# Patient Record
Sex: Male | Born: 1980 | Race: Black or African American | Hispanic: No | Smoking: Former smoker
Health system: Southern US, Community
[De-identification: ages and names within clinical notes are randomized; demographics above are authoritative.]

## PROBLEM LIST (undated history)

## (undated) DIAGNOSIS — F319 Bipolar disorder, unspecified: Secondary | ICD-10-CM

## (undated) DIAGNOSIS — F29 Unspecified psychosis not due to a substance or known physiological condition: Secondary | ICD-10-CM

## (undated) DIAGNOSIS — I10 Essential (primary) hypertension: Secondary | ICD-10-CM

## (undated) DIAGNOSIS — F419 Anxiety disorder, unspecified: Secondary | ICD-10-CM

## (undated) DIAGNOSIS — F329 Major depressive disorder, single episode, unspecified: Secondary | ICD-10-CM

## (undated) DIAGNOSIS — F209 Schizophrenia, unspecified: Secondary | ICD-10-CM

## (undated) HISTORY — DX: Essential (primary) hypertension: I10

## (undated) HISTORY — DX: Unspecified psychosis not due to a substance or known physiological condition: F29

## (undated) HISTORY — DX: Anxiety disorder, unspecified: F41.9

## (undated) HISTORY — PX: WISDOM TOOTH EXTRACTION: SHX21

## (undated) HISTORY — DX: Major depressive disorder, single episode, unspecified: F32.9

---
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
## 2002-04-13 ENCOUNTER — Encounter: Payer: Self-pay | Admitting: *Deleted

## 2002-04-13 ENCOUNTER — Emergency Department (HOSPITAL_COMMUNITY): Admission: EM | Admit: 2002-04-13 | Discharge: 2002-04-13 | Payer: Self-pay | Admitting: *Deleted

## 2003-08-15 ENCOUNTER — Emergency Department (HOSPITAL_COMMUNITY): Admission: EM | Admit: 2003-08-15 | Discharge: 2003-08-15 | Payer: Self-pay | Admitting: Emergency Medicine

## 2005-06-19 ENCOUNTER — Emergency Department (HOSPITAL_COMMUNITY): Admission: EM | Admit: 2005-06-19 | Discharge: 2005-06-19 | Payer: Self-pay | Admitting: Emergency Medicine

## 2007-09-02 ENCOUNTER — Emergency Department (HOSPITAL_COMMUNITY): Admission: EM | Admit: 2007-09-02 | Discharge: 2007-09-02 | Payer: Self-pay | Admitting: Emergency Medicine

## 2007-09-24 ENCOUNTER — Emergency Department (HOSPITAL_COMMUNITY): Admission: EM | Admit: 2007-09-24 | Discharge: 2007-09-24 | Payer: Self-pay | Admitting: Emergency Medicine

## 2007-11-02 ENCOUNTER — Emergency Department (HOSPITAL_COMMUNITY): Admission: EM | Admit: 2007-11-02 | Discharge: 2007-11-02 | Payer: Self-pay | Admitting: Emergency Medicine

## 2008-09-29 ENCOUNTER — Emergency Department (HOSPITAL_COMMUNITY): Admission: EM | Admit: 2008-09-29 | Discharge: 2008-09-29 | Payer: Self-pay | Admitting: Emergency Medicine

## 2009-04-14 ENCOUNTER — Emergency Department (HOSPITAL_COMMUNITY): Admission: EM | Admit: 2009-04-14 | Discharge: 2009-04-14 | Payer: Self-pay | Admitting: Emergency Medicine

## 2010-03-30 ENCOUNTER — Emergency Department (HOSPITAL_COMMUNITY)
Admission: EM | Admit: 2010-03-30 | Discharge: 2010-03-30 | Disposition: A | Discharge status: Home / Self Care | Payer: Self-pay | Attending: Emergency Medicine | Admitting: Emergency Medicine

## 2010-03-30 DIAGNOSIS — R51 Headache: Secondary | ICD-10-CM | POA: Insufficient documentation

## 2010-05-03 LAB — BASIC METABOLIC PANEL
BUN: 23 mg/dL (ref 6–23)
CO2: 28 mEq/L (ref 19–32)
Calcium: 8.7 mg/dL (ref 8.4–10.5)
Chloride: 103 mEq/L (ref 96–112)
Creatinine, Ser: 1.15 mg/dL (ref 0.4–1.5)
GFR calc Af Amer: >60 mL/min (ref >60)
GFR calc non Af Amer: >60 mL/min (ref >60)
Glucose, Bld: 102 mg/dL — ABNORMAL HIGH (ref 70–99)
Potassium: 3.8 mEq/L (ref 3.5–5.1)
Sodium: 137 mEq/L (ref 135–145)

## 2010-05-03 LAB — CBC
HCT: 45.1 % (ref 39.0–52.0)
Hemoglobin: 14.8 g/dL (ref 13.0–17.0)
MCHC: 32.7 g/dL (ref 30.0–36.0)
MCV: 83.5 fL (ref 78.0–100.0)
Platelets: 182 10*3/uL (ref 150–400)
RBC: 5.4 MIL/uL (ref 4.22–5.81)
RDW: 14 % (ref 11.5–15.5)
WBC: 8 10*3/uL (ref 4.0–10.5)

## 2010-05-03 LAB — DIFFERENTIAL
Basophils Absolute: 0 10*3/uL (ref 0.0–0.1)
Basophils Relative: 0 % (ref 0–1)
Eosinophils Absolute: 0 10*3/uL (ref 0.0–0.7)
Eosinophils Relative: 0 % (ref 0–5)
Lymphocytes Relative: 2 % — ABNORMAL LOW (ref 12–46)
Lymphs Abs: 0.2 10*3/uL — ABNORMAL LOW (ref 0.7–4.0)
Monocytes Absolute: 0.3 10*3/uL (ref 0.1–1.0)
Monocytes Relative: 3 % (ref 3–12)
Neutro Abs: 7.6 10*3/uL (ref 1.7–7.7)
Neutrophils Relative %: 94 % — ABNORMAL HIGH (ref 43–77)

## 2010-06-09 DIAGNOSIS — F250 Schizoaffective disorder, bipolar type: Secondary | ICD-10-CM | POA: Insufficient documentation

## 2010-06-09 DIAGNOSIS — F319 Bipolar disorder, unspecified: Secondary | ICD-10-CM | POA: Insufficient documentation

## 2010-06-09 DIAGNOSIS — F32A Depression, unspecified: Secondary | ICD-10-CM

## 2010-06-09 HISTORY — DX: Depression, unspecified: F32.A

## 2010-07-09 ENCOUNTER — Emergency Department (HOSPITAL_COMMUNITY)
Admission: EM | Admit: 2010-07-09 | Discharge: 2010-07-09 | Disposition: A | Discharge status: Home / Self Care | Payer: Self-pay | Attending: Emergency Medicine | Admitting: Emergency Medicine

## 2010-07-09 DIAGNOSIS — Z711 Person with feared health complaint in whom no diagnosis is made: Secondary | ICD-10-CM | POA: Insufficient documentation

## 2010-07-10 ENCOUNTER — Emergency Department (HOSPITAL_COMMUNITY)
Admission: EM | Admit: 2010-07-10 | Discharge: 2010-07-11 | Disposition: A | Discharge status: Home / Self Care | Payer: Self-pay | Attending: Emergency Medicine | Admitting: Emergency Medicine

## 2010-07-10 ENCOUNTER — Emergency Department (HOSPITAL_COMMUNITY): Admission: EM | Admit: 2010-07-10 | Payer: Self-pay | Source: Home / Self Care

## 2010-07-10 DIAGNOSIS — Z008 Encounter for other general examination: Secondary | ICD-10-CM | POA: Insufficient documentation

## 2010-07-10 DIAGNOSIS — F17200 Nicotine dependence, unspecified, uncomplicated: Secondary | ICD-10-CM | POA: Insufficient documentation

## 2010-07-10 LAB — BASIC METABOLIC PANEL
CO2: 26 mEq/L (ref 19–32)
Calcium: 10.2 mg/dL (ref 8.4–10.5)
Creatinine, Ser: 1.24 mg/dL (ref 0.4–1.5)
GFR calc Af Amer: >60 mL/min (ref >60)

## 2010-07-10 LAB — RAPID URINE DRUG SCREEN, HOSP PERFORMED
Opiates: NOT DETECTED
Tetrahydrocannabinol: NOT DETECTED

## 2010-07-10 LAB — CBC
HCT: 43 % (ref 39.0–52.0)
MCHC: 32.6 g/dL (ref 30.0–36.0)
RDW: 14.1 % (ref 11.5–15.5)

## 2010-07-10 LAB — DIFFERENTIAL
Basophils Absolute: 0 10*3/uL (ref 0.0–0.1)
Basophils Relative: 0 % (ref 0–1)
Eosinophils Absolute: 0.1 10*3/uL (ref 0.0–0.7)
Eosinophils Relative: 1 % (ref 0–5)
Monocytes Absolute: 0.7 10*3/uL (ref 0.1–1.0)

## 2010-07-10 LAB — ETHANOL: Alcohol, Ethyl (B): <11 mg/dL — ABNORMAL HIGH (ref 0–10)

## 2010-07-13 ENCOUNTER — Emergency Department (HOSPITAL_COMMUNITY)
Admission: EM | Admit: 2010-07-13 | Discharge: 2010-07-17 | Disposition: A | Discharge status: Other Acute Inpatient Hospital | Payer: Self-pay | Attending: Emergency Medicine | Admitting: Emergency Medicine

## 2010-07-13 DIAGNOSIS — F29 Unspecified psychosis not due to a substance or known physiological condition: Secondary | ICD-10-CM | POA: Insufficient documentation

## 2010-07-13 DIAGNOSIS — R45851 Suicidal ideations: Secondary | ICD-10-CM | POA: Insufficient documentation

## 2010-07-13 LAB — DIFFERENTIAL
Basophils Absolute: 0 10*3/uL (ref 0.0–0.1)
Eosinophils Absolute: 0.2 10*3/uL (ref 0.0–0.7)
Eosinophils Relative: 3 % (ref 0–5)
Lymphocytes Relative: 19 % (ref 12–46)
Monocytes Absolute: 0.5 10*3/uL (ref 0.1–1.0)
Monocytes Relative: 8 % (ref 3–12)
Neutro Abs: 4.5 10*3/uL (ref 1.7–7.7)
Neutrophils Relative %: 69 % (ref 43–77)

## 2010-07-13 LAB — CBC
HCT: 43.5 % (ref 39.0–52.0)
MCV: 82.4 fL (ref 78.0–100.0)
Platelets: 206 10*3/uL (ref 150–400)
RBC: 5.28 MIL/uL (ref 4.22–5.81)
WBC: 6.6 10*3/uL (ref 4.0–10.5)

## 2010-07-13 LAB — BASIC METABOLIC PANEL
CO2: 33 mEq/L — ABNORMAL HIGH (ref 19–32)
Chloride: 101 mEq/L (ref 96–112)
GFR calc Af Amer: >60 mL/min (ref >60)
GFR calc non Af Amer: >60 mL/min (ref >60)
Glucose, Bld: 102 mg/dL — ABNORMAL HIGH (ref 70–99)
Potassium: 4.8 mEq/L (ref 3.5–5.1)
Sodium: 138 mEq/L (ref 135–145)

## 2010-07-13 LAB — RAPID URINE DRUG SCREEN, HOSP PERFORMED
Benzodiazepines: NOT DETECTED
Cocaine: NOT DETECTED
Opiates: NOT DETECTED

## 2010-07-17 ENCOUNTER — Inpatient Hospital Stay (HOSPITAL_COMMUNITY)
Admission: AD | Admit: 2010-07-17 | Discharge: 2010-07-20 | DRG: 885 | Disposition: A | Discharge status: Home / Self Care | Payer: 59 | Attending: Psychiatry | Admitting: Psychiatry

## 2010-07-17 DIAGNOSIS — G4700 Insomnia, unspecified: Secondary | ICD-10-CM

## 2010-07-17 DIAGNOSIS — Z560 Unemployment, unspecified: Secondary | ICD-10-CM

## 2010-07-17 DIAGNOSIS — F39 Unspecified mood [affective] disorder: Principal | ICD-10-CM

## 2010-07-17 DIAGNOSIS — Z91199 Patient's noncompliance with other medical treatment and regimen due to unspecified reason: Secondary | ICD-10-CM

## 2010-07-17 DIAGNOSIS — Z5900 Homelessness unspecified: Secondary | ICD-10-CM

## 2010-07-17 DIAGNOSIS — F29 Unspecified psychosis not due to a substance or known physiological condition: Secondary | ICD-10-CM

## 2010-07-17 DIAGNOSIS — Z9119 Patient's noncompliance with other medical treatment and regimen: Secondary | ICD-10-CM

## 2010-07-17 DIAGNOSIS — F319 Bipolar disorder, unspecified: Secondary | ICD-10-CM

## 2010-07-18 DIAGNOSIS — F29 Unspecified psychosis not due to a substance or known physiological condition: Secondary | ICD-10-CM

## 2010-07-28 NOTE — Discharge Summary (Signed)
  NAMEAMADO, ANDAL            ACCOUNT NO.:  [id number]  MEDICAL RECORD NO.:  [id number]  LOCATION:  0401                          FACILITY:  [facility]  PHYSICIAN:  Eulogio Ditch, MD DATE OF BIRTH:  02-07-1981  DATE OF ADMISSION:  07/17/2010 DATE OF DISCHARGE:  07/20/2010                              DISCHARGE SUMMARY   HISTORY OF PRESENT ILLNESS:  Please refer to initial psych assessment for details.  Briefly, 30 year old [place] American male who was admitted for delusional behavior.  The patient was started on Risperdal 1 mg twice a day along with Depakote 500 mg twice a day.  The patient responded to these medications well.  When I saw this patient on July 20, 2010 the patient was very logical and goal-directed, was not delusional, hallucinating, was not suicidal or homicidal.  The patient reported that if he gets a place to stay he will fine.  He came to this hospital to get help both for his medications and also to get a place to live.  The patient denied any side effects from the medications.  At the time of discharge the patient was very logical and goal-directed. His insight and judgment was intact.  I spoke with the clinical social worker and she found a place/shelter for the patient and the patient was agreeable for discharge to go to the shelter.  DIAGNOSES AT THE TIME OF DISCHARGE:  Axis I: Mood disorder NOS, rule out bipolar disorder with psychotic symptoms. Axis II: Deferred. Axis III: No active medical issue. Axis IV: Psychosocial stressors, homeless. Axis V: 55.  MEDICATIONS AT THE TIME OF DISCHARGE: 1. Depakote 500 mg twice a day. 2. Risperdal 1 mg twice a day.  Labs at the time of discharge within normal limits.  DISCHARGE FOLLOWUP:  The patient will follow up at [facility], [address] [place], phone number [phone].  Appointment between 9 and 11.  No specific date given to the patient, but he will follow up with there in 1-2  days.     Eulogio Ditch, MD     SA/MEDQ  D:  07/20/2010  T:  07/20/2010  Job:  [id number]  Electronically Signed by Eulogio Ditch  on 07/28/2010 09:55:32 AM

## 2010-07-28 NOTE — H&P (Signed)
NAMEMarland Kitchen  Brad Moreno, Brad Moreno NO.:  [id number]  MEDICAL RECORD NO.:  [id number]  LOCATION:  0401                          FACILITY:  [facility]  PHYSICIAN:  Eulogio Ditch, MD DATE OF BIRTH:  1980-09-03  DATE OF ADMISSION:  07/17/2010 DATE OF DISCHARGE:                      PSYCHIATRIC ADMISSION ASSESSMENT   This is an involuntary admission to the services of Dr. Rogers Blocker.  This is a 30 year old, divorced, African American male.  The commitment papers indicate that he was delusional.  He was reporting that people were trying to put his mother into his stomach.  He was hyperreligious. This was on June 8th.  He originally presented to [facility] on June 4th.  He reported that he was not sleeping due to "nightmares."  He stated that everything is just all coming together, how I got to this point and how I am here right now.  He denied being suicidal or homicidal.  He had no drugs in his system.  He had no alcohol.  It was noted that he had been seen in the ED 2 times in the past 4 days.  He first brought himself in due to being kicked out of the house, and then to involuntarily committed the next day because he was threatening to kill his mother.  Today, on June 4th, he was complaining of not knowing how he got here and he had been through so much, etc.  On the 5th, he ended up being tazed by the [employer] police.  He had Geodon administered.  Then he stated "I'm calm, I'm calm, this is crimes against a black man."  They were trying to find a bed for him at [facility].  He was on the waiting list and ultimately he got transferred here to the [facility].  PAST PSYCHIATRIC HISTORY:  He reports 1 prior admission in 2006 or 2007 at [facility].  He states he was diagnosed as bipolar and he has had no medication in years.  He is a high school graduate in 2003.  He has been married and divorced once.  He says he cannot tell me if he has any children or not.   He is unclear as to when he last worked, but he would like to sign up for benefits.  FAMILY HISTORY:  None.  ALCOHOL AND DRUG HISTORY:  None.  PRIMARY CARE PROVIDER:  Unknown.  MEDICAL PROBLEMS:  As far as we know there are none.  MEDICATIONS:  None.  DRUG ALLERGIES:  No known drug allergies.  POSITIVE PHYSICAL FINDINGS:  Well-developed, well-nourished, African American male who appears his stated age.  He was afebrile.  His temperature ranged from 97.6 to 98.8.  His blood pressure ranged from 122/71 to 146/90.  His pulse ranged from 82 to 107, respirations were 16 to 20.  As already stated, he did not test positive for any substances. He had no measurable alcohol.  He had no remarkable findings on his CBC or BMP.  MENTAL STATUS EXAM:  Today he is alert and oriented.  He is appropriately groomed, dressed and nourished.  His speech was not pressured.  His mood; he becomes easily frustrated.  His affect had a normal range.  His thought processes are somewhat clear, rational and goal oriented.  He states that he can hear other people thoughts, but that is "a gift."  He also had told them that he thought that people could see his soul by looking directly into his eyes, but today he says appropriately that your eyes are the windows to soul.  It is unclear exactly have psychotic he actually has been.  Judgment and insight are fair.  Concentration and memory are superficially intact.  Intelligence is average.  DIAGNOSES:  Axis I:  He is bipolar by history.  He appeared to be having psychotic features. Axis II:  Deferred. Axis III:  None known. Axis IV:  He is homeless.  He has no income. Axis V:  25.  He was started on Risperdal 1 mg p.o. b.i.d. and Depakote 500 mg p.o. b.i.d. as well as Benadryl 50 mg at bedtime by Dr. Rogers Blocker while he was in the emergency department.  We will have the case manager work with him regarding applying for SSDI and placement.     [name]  Leonarda Salon, P.A.-C.   ______________________________ Eulogio Ditch, MD    MD/MEDQ  D:  07/18/2010  T:  07/18/2010  Job:  [id number]  Electronically Signed by Jaci Lazier ADAMS P.A.-C. on 07/19/2010 09:50:23 AM Electronically Signed by Eulogio Ditch  on 07/28/2010 09:55:29 AM

## 2010-09-16 ENCOUNTER — Other Ambulatory Visit: Payer: Self-pay

## 2010-09-16 ENCOUNTER — Emergency Department (HOSPITAL_COMMUNITY)
Admission: EM | Admit: 2010-09-16 | Discharge: 2010-09-17 | Disposition: A | Discharge status: Other Acute Inpatient Hospital | Payer: Self-pay | Attending: Emergency Medicine | Admitting: Emergency Medicine

## 2010-09-16 ENCOUNTER — Encounter: Payer: Self-pay | Admitting: Emergency Medicine

## 2010-09-16 DIAGNOSIS — F29 Unspecified psychosis not due to a substance or known physiological condition: Secondary | ICD-10-CM | POA: Insufficient documentation

## 2010-09-16 DIAGNOSIS — F209 Schizophrenia, unspecified: Secondary | ICD-10-CM | POA: Insufficient documentation

## 2010-09-16 DIAGNOSIS — I498 Other specified cardiac arrhythmias: Secondary | ICD-10-CM | POA: Insufficient documentation

## 2010-09-16 HISTORY — DX: Schizophrenia, unspecified: F20.9

## 2010-09-16 LAB — DIFFERENTIAL
Basophils Absolute: 0 10*3/uL (ref 0.0–0.1)
Basophils Relative: 0 % (ref 0–1)
Eosinophils Absolute: 0.1 10*3/uL (ref 0.0–0.7)
Eosinophils Relative: 1 % (ref 0–5)
Lymphocytes Relative: 46 % (ref 12–46)

## 2010-09-16 LAB — COMPREHENSIVE METABOLIC PANEL
ALT: 20 U/L (ref 0–53)
AST: 26 U/L (ref 0–37)
Albumin: 4.6 g/dL (ref 3.5–5.2)
CO2: 25 mEq/L (ref 19–32)
Calcium: 9.9 mg/dL (ref 8.4–10.5)
Creatinine, Ser: 1.12 mg/dL (ref 0.50–1.35)
Sodium: 138 mEq/L (ref 135–145)
Total Protein: 8 g/dL (ref 6.0–8.3)

## 2010-09-16 LAB — CBC
MCHC: 33.4 g/dL (ref 30.0–36.0)
MCV: 80.2 fL (ref 78.0–100.0)
Platelets: 231 10*3/uL (ref 150–400)
RDW: 13.3 % (ref 11.5–15.5)
WBC: 5.8 10*3/uL (ref 4.0–10.5)

## 2010-09-16 MED ORDER — LORAZEPAM 2 MG/ML IJ SOLN
1.0000 mg | Freq: Once | INTRAMUSCULAR | Status: AC
  Administered 2010-09-16: 1 mg via INTRAMUSCULAR
  Filled 2010-09-16: qty 1

## 2010-09-16 NOTE — ED Notes (Signed)
Samson Frederic notified of ivc papers expire tomorrow.

## 2010-09-16 NOTE — ED Notes (Signed)
Pt said he would not give any blood he NO

## 2010-09-16 NOTE — ED Notes (Signed)
Pt awake and alert. Talking and cooperative at this time. Pt having rambling thoughts and states that he doesn't understand why he keeps getting brought back to the hospital. Denies suicidal or homicidal thoughts at this time. Pt eating his dinner tray. Officers at bedside. Pt in wrist and ankle cuffs.

## 2010-09-16 NOTE — ED Notes (Signed)
prt

## 2010-09-16 NOTE — ED Notes (Signed)
Pt resting calmly w/ eyes closed. Rise & fall of the chest noted. NAD noted at this time.  [employer] remains in room w/ pt

## 2010-09-16 NOTE — ED Notes (Signed)
Pt uncooperative and refuses to answer questions. Cursing when he does speak. Sitting in chair with officers at bedside. Cuffs in place wrists and ankles. Pt appears hostile. Unable to assess suicidal or homicidal ideations at this time.

## 2010-09-16 NOTE — ED Notes (Signed)
Brad Moreno from [facility] called and states that pt has been on waiting list at [facility] since Thursday or Friday of last week.

## 2010-09-16 NOTE — ED Provider Notes (Addendum)
History    Scribed for Waverly Chavarria Smitty Cords, MD, the patient was seen in room APA03/APA03. This chart was scribed by Clarita Crane. This patient's care was started at 4:05PM.  CSN: [id number] Arrival date & time: 09/16/2010  3:34 PM  Chief Complaint  Patient presents with  . Medical Clearance   HPI Patient is a 30 year old male BIB police for medical clearance. A Level 5 Caveat applies due to active psychosis and agitation. Patient uncooperative in offering history. Per police, patient has been in Patent examiner custody for the past 20 days due to communicating threats towards family. Police note that patient has been agitated, combative, circumstantial and tangential since being brought into custody 20 days ago but symptoms have been worse the pa several days. Police state patient has been homicidal but denies suicidal ideations. Additionally, police state that involuntary commitment time period is set to run out in the next several days. Patient with history of schizophrenia.   PAST MEDICAL HISTORY:  Past Medical History  Diagnosis Date  . Schizophrenia     PAST SURGICAL HISTORY:  History reviewed. No pertinent past surgical history.  MEDICATIONS:  Previous Medications   No medications on file     ALLERGIES:  Allergies as of 09/16/2010  . (Not on File)     FAMILY HISTORY:  History reviewed. No pertinent family history.   SOCIAL HISTORY: History   Social History  . Marital Status: Legally Separated    Spouse Name: N/A    Number of Children: N/A  . Years of Education: N/A   Social History Main Topics  . Smoking status: None  . Smokeless tobacco: None  . Alcohol Use:   . Drug Use:   . Sexually Active:    Other Topics Concern  . None   Social History Narrative  . None      Review of Systems  Unable to perform ROS: Other     Physical Exam  There were no vitals taken for this visit.  Physical Exam  Constitutional: He appears well-developed and  well-nourished. No distress.  HENT:  Head: Normocephalic and atraumatic.  Eyes: Pupils are equal, round, and reactive to light. Right eye exhibits no discharge. Left eye exhibits no discharge.  Neck: Normal range of motion. Neck supple.  Cardiovascular: Normal rate and regular rhythm.   No murmur heard. Pulmonary/Chest: Effort normal and breath sounds normal.  Abdominal: Soft. Bowel sounds are normal. He exhibits no distension.  Musculoskeletal: Normal range of motion.  Neurological: He is alert.  Skin: Skin is warm and dry.  Psychiatric:       Agitated, psychotic    ED Course  Procedures  OTHER DATA REVIEWED: Nursing notes, vital signs, and past medical records reviewed.   DIAGNOSTIC STUDIES:    LABS / RADIOLOGY: Results for orders placed during the hospital encounter of 09/16/10  CBC      Component Value Range   WBC 5.8  4.0 - 10.5 (K/uL)   RBC 5.45  4.22 - 5.81 (MIL/uL)   Hemoglobin 14.6  13.0 - 17.0 (g/dL)   HCT 02.7  25.3 - 66.4 (%)   MCV 80.2  78.0 - 100.0 (fL)   MCH 26.8  26.0 - 34.0 (pg)   MCHC 33.4  30.0 - 36.0 (g/dL)   RDW 40.3  47.4 - 25.9 (%)   Platelets 231  150 - 400 (K/uL)  DIFFERENTIAL      Component Value Range   Neutrophils Relative 45  43 - 77 (%)  Neutro Abs 2.6  1.7 - 7.7 (K/uL)   Lymphocytes Relative 46  12 - 46 (%)   Lymphs Abs 2.7  0.7 - 4.0 (K/uL)   Monocytes Relative 8  3 - 12 (%)   Monocytes Absolute 0.4  0.1 - 1.0 (K/uL)   Eosinophils Relative 1  0 - 5 (%)   Eosinophils Absolute 0.1  0.0 - 0.7 (K/uL)   Basophils Relative 0  0 - 1 (%)   Basophils Absolute 0.0  0.0 - 0.1 (K/uL)  COMPREHENSIVE METABOLIC PANEL      Component Value Range   Sodium 138  135 - 145 (mEq/L)   Potassium 3.7  3.5 - 5.1 (mEq/L)   Chloride 100  96 - 112 (mEq/L)   CO2 25  19 - 32 (mEq/L)   Glucose, Bld 101 (*) 70 - 99 (mg/dL)   BUN 9  6 - 23 (mg/dL)   Creatinine, Ser 9.14  0.50 - 1.35 (mg/dL)   Calcium 9.9  8.4 - 78.2 (mg/dL)   Total Protein 8.0  6.0 - 8.3  (g/dL)   Albumin 4.6  3.5 - 5.2 (g/dL)   AST 26  0 - 37 (U/L)   ALT 20  0 - 53 (U/L)   Alkaline Phosphatase 76  39 - 117 (U/L)   Total Bilirubin 0.4  0.3 - 1.2 (mg/dL)   GFR calc non Af Amer >60  >60 (mL/min)   GFR calc Af Amer >60  >60 (mL/min)  ETHANOL      Component Value Range   Alcohol, Ethyl (B) <11  0 - 11 (mg/dL)     PROCEDURES:  ED COURSE / COORDINATION OF CARE: Plan: under commitment to [facility]   MDM: Differential Diagnosis: Schizophrenia/ HI    PLAN:to [facility] The patient is to return the emergency department if there is any worsening of symptoms. I have reviewed the discharge instructions with the patient/family   CONDITION ON DISCHARGE:    MEDICATIONS GIVEN IN THE E.D.  Medications  LORazepam (ATIVAN) injection 1 mg (1 mg Intramuscular Given 09/16/10 1645)     Date: 09/16/2010  Rate: 114  Rhythm: sinus tachycardia  QRS Axis: normal  Intervals: normal  ST/T Wave abnormalities: nonspecific ST changes  Conduction Disutrbances:none  Narrative Interpretation: sinus tachycardia  Old EKG Reviewed: none available  I personally performed the services described in this documentation, which was scribed in my presence. The recorded information has been reviewed and considered. Dominigue Gellner K Layloni Fahrner-Rasch, MD     Shawnte Demarest Smitty Cords, MD 09/16/10 1748  Joleen Stuckert K Gretel Cantu-Rasch, MD 09/16/10 2314

## 2010-09-16 NOTE — ED Notes (Signed)
Pt remains cuffed to the stretcher watching tv, [employer] remains in room. NAD noted. Pt has been unable to give a urine sample at this time.

## 2010-09-16 NOTE — ED Notes (Signed)
Sleeping at intervals. Remains calm and cooperative.

## 2010-09-16 NOTE — ED Notes (Addendum)
Pt escorted to ed by [employer] and rpd. Per papers-'pt is disorganized and extremely mood labile. He is having paranoid and grandiose delusions and thinks that he is the only black man alive and that he must defend the black race. He also believes that he has been crucified. He endorces ongoing homicidal ideation and states that he will kill everybody else. He is hyper alert but not oriented to date even when told. He is violent and unable to control his rage. He is a danger to himself and others.' pt uncooperative at this time. Refuses to answer questions. Unable to obtain vitals. Law enforcement at bedside.

## 2010-09-16 NOTE — ED Notes (Signed)
Pt sleeping at present. Officers at bedside with patient cuffed to stretcher.

## 2010-09-16 NOTE — ED Notes (Signed)
Officers at bedside with patient in wrist and ankle cuffs.

## 2010-09-17 NOTE — Progress Notes (Signed)
Patient awaiting acceptance at [facility]. Slept through the night. Policeman at the bedside. No behavioral issues.

## 2010-09-17 NOTE — ED Provider Notes (Signed)
  Physical Exam  BP 126/77  Pulse 78  Resp 18  SpO2 100%  Physical Exam  ED Course  Procedures  MDM Accepted to  [facility] for evaluation of his psychiatric illness.  This has been accepted to a [facility] by Ms. Sabino Gasser RN on behalf of the physicians.  Patient is in stable condition at this time and safe for transport      Lyanne Co, MD 09/17/10 [phone]

## 2010-09-17 NOTE — ED Notes (Signed)
Pt awake & talking to himself. Pt cuffed to stretcher & [employer] remains in the hall outside pt room.

## 2010-09-17 NOTE — ED Notes (Signed)
Breakfast tray provided to PT.

## 2010-09-17 NOTE — ED Notes (Signed)
Pt resting calmly w/ eyes closed. Rise & fall of the chest noted. NAD noted at this time.  Pt remains cuffed to the stretcher, [employer] sitting outside the room.

## 2010-09-17 NOTE — ED Notes (Signed)
[facility] called stating they have an opening and need new IVC papers and ED notes faxed.  New IVC papers signed and notarized by EDP.  Pt eating lunch tray at this time.  Pt in 4 pt. Cuffs by [employer].  CMS intact in all extremities.  [employer] at bedside.  nad noted.

## 2010-11-06 LAB — URINALYSIS, ROUTINE W REFLEX MICROSCOPIC
Glucose, UA: NEGATIVE
Hgb urine dipstick: NEGATIVE
Specific Gravity, Urine: 1.025

## 2010-11-06 LAB — GC/CHLAMYDIA PROBE AMP, GENITAL
Chlamydia, DNA Probe: NEGATIVE
GC Probe Amp, Genital: POSITIVE — AB

## 2013-04-10 DIAGNOSIS — F315 Bipolar disorder, current episode depressed, severe, with psychotic features: Secondary | ICD-10-CM | POA: Diagnosis not present

## 2013-04-11 ENCOUNTER — Encounter (HOSPITAL_COMMUNITY): Payer: Self-pay | Admitting: Emergency Medicine

## 2013-04-11 ENCOUNTER — Emergency Department (HOSPITAL_COMMUNITY)
Admission: EM | Admit: 2013-04-11 | Discharge: 2013-04-11 | Disposition: A | Discharge status: Home / Self Care | Payer: Medicare Other | Attending: Emergency Medicine | Admitting: Emergency Medicine

## 2013-04-11 DIAGNOSIS — Z8659 Personal history of other mental and behavioral disorders: Secondary | ICD-10-CM | POA: Diagnosis not present

## 2013-04-11 DIAGNOSIS — Z87891 Personal history of nicotine dependence: Secondary | ICD-10-CM | POA: Diagnosis not present

## 2013-04-11 DIAGNOSIS — R51 Headache: Secondary | ICD-10-CM | POA: Insufficient documentation

## 2013-04-11 DIAGNOSIS — R519 Headache, unspecified: Secondary | ICD-10-CM

## 2013-04-11 MED ORDER — BUTALBITAL-APAP-CAFFEINE 50-325-40 MG PO TABS: 1.0000 | ORAL_TABLET | Freq: Four times a day (QID) | ORAL | Status: DC | PRN

## 2013-04-11 NOTE — ED Provider Notes (Signed)
Medical screening examination/treatment/procedure(s) were performed by non-physician practitioner and as supervising physician I was immediately available for consultation/collaboration.   EKG Interpretation None       Donnetta HutchingBrian Landrie Beale, MD 04/11/13 [phone]

## 2013-04-11 NOTE — Discharge Instructions (Signed)

## 2013-04-11 NOTE — ED Provider Notes (Signed)
CSN: [id number]     Arrival date & time 04/11/13  0754 History   First MD Initiated Contact with Patient 04/11/13 [phone]     Chief Complaint  Patient presents with  . Headache     (Consider location/radiation/quality/duration/timing/severity/associated sxs/prior Treatment) Patient is a 33 y.o. male presenting with headaches. The history is provided by the patient.  Headache Pain location:  R parietal Quality:  Dull Radiates to:  Does not radiate Severity currently:  4/10 Severity at highest:  7/10 Onset quality:  Gradual Duration:  3 days Timing:  Intermittent Progression:  Unchanged Chronicity:  New Context: not activity, not exposure to bright light, not caffeine, not defecating, not eating, not loud noise and not straining   Relieved by:  NSAIDs Exacerbated by: no recognized triggers. Associated symptoms: no blurred vision, no congestion, no dizziness, no ear pain, no pain, no facial pain, no fever, no focal weakness, no hearing loss, no loss of balance, no nausea, no neck pain, no neck stiffness, no paresthesias, no photophobia, no seizures, no sinus pressure, no sore throat, no swollen glands, no visual change, no vomiting and no weakness   Risk factors: no family hx of SAH and does not have insomnia     Past Medical History  Diagnosis Date  . Schizophrenia    History reviewed. No pertinent past surgical history. History reviewed. No pertinent family history. History  Substance Use Topics  . Smoking status: Former Smoker    Quit date: 04/11/2008  . Smokeless tobacco: Not on file  . Alcohol Use: No    Review of Systems  Constitutional: Negative for fever.  HENT: Negative for congestion, ear pain, hearing loss, sinus pressure and sore throat.   Eyes: Negative for blurred vision, photophobia, pain and visual disturbance.  Gastrointestinal: Negative for nausea and vomiting.  Musculoskeletal: Negative for neck pain and neck stiffness.  Neurological: Positive for  headaches. Negative for dizziness, focal weakness, seizures, speech difficulty, weakness, light-headedness, paresthesias and loss of balance.      Allergies  Pork-derived products  Home Medications   Current Outpatient Rx  Name  Route  Sig  Dispense  Refill  . butalbital-acetaminophen-caffeine (FIORICET) 50-325-40 MG per tablet   Oral   Take 1 tablet by mouth every 6 (six) hours as needed for headache.   20 tablet   0    BP 132/81  Pulse 95  Temp(Src) 98.2 F (36.8 C) (Oral)  Resp 17  Ht 5\' 10"  (1.778 m)  Wt 229 lb (103.874 kg)  BMI 32.86 kg/m2  SpO2 98% Physical Exam  Nursing note and vitals reviewed. Constitutional: He is oriented to person, place, and time. He appears well-developed and well-nourished.  Uncomfortable appearing  HENT:  Head: Normocephalic and atraumatic.  Mouth/Throat: Oropharynx is clear and moist.  Eyes: EOM are normal. Pupils are equal, round, and reactive to light.  Neck: Normal range of motion. Neck supple.  Cardiovascular: Normal rate and normal heart sounds.   Pulmonary/Chest: Effort normal.  Abdominal: Soft. There is no tenderness.  Musculoskeletal: Normal range of motion.  Lymphadenopathy:    He has no cervical adenopathy.  Neurological: He is alert and oriented to person, place, and time. He has normal strength. No sensory deficit. Gait normal. GCS eye subscore is 4. GCS verbal subscore is 5. GCS motor subscore is 6.  Normal heel-shin, normal rapid alternating movements. Cranial nerves III-XII intact.  No pronator drift.  Skin: Skin is warm and dry. No rash noted.  Psychiatric: He has a normal  mood and affect. His speech is normal and behavior is normal. Thought content normal. Cognition and memory are normal.    ED Course  Procedures (including critical care time) Labs Review Labs Reviewed - No data to display Imaging Review No results found.   EKG Interpretation None      MDM   Final diagnoses:  Headache    Pt with a 3  day history of right sided parietal headache with normal neurologic exam.  Afebrile,  No nuchal rigidity,  No localized weakness.  Discussed screening head CT although clinically he does not need this today which he was advised,  He defers this today.  Will prescribe fioricet,  Referral to Dr Gerilyn Pilgrim for further eval if headache persists, pt understands to call for appt if not improving over the next few days.  Also given referrals for pcp.    Burgess Amor, PA-C 04/11/13 [phone]

## 2013-04-11 NOTE — ED Notes (Signed)
Pt c/o constant headache x 3 days. Denies n/v/d/dizziness/blurred vision/weakness or trouble swallowing. States has no pcp but seen Behavioral Health yesterday and was told bp was high. Nad.

## 2013-07-16 DIAGNOSIS — F315 Bipolar disorder, current episode depressed, severe, with psychotic features: Secondary | ICD-10-CM | POA: Diagnosis not present

## 2013-07-18 ENCOUNTER — Encounter: Payer: Self-pay | Admitting: Family Medicine

## 2013-08-01 ENCOUNTER — Ambulatory Visit (INDEPENDENT_AMBULATORY_CARE_PROVIDER_SITE_OTHER): Payer: Medicare Other | Admitting: Physician Assistant

## 2013-08-01 ENCOUNTER — Encounter: Payer: Self-pay | Admitting: Physician Assistant

## 2013-08-01 VITALS — BP 120/76 | HR 84 | Temp 98.4°F | Resp 18 | Ht 68.5 in | Wt 214.0 lb

## 2013-08-01 DIAGNOSIS — Z0000 Encounter for general adult medical examination without abnormal findings: Secondary | ICD-10-CM | POA: Diagnosis not present

## 2013-08-01 DIAGNOSIS — Z23 Encounter for immunization: Secondary | ICD-10-CM | POA: Diagnosis not present

## 2013-08-01 NOTE — Progress Notes (Signed)
Patient ID: Brad Moreno MRN: [id number], DOB: 02/09/1980 32 y.o. Date of Encounter: 08/01/2013, 10:55 AM    Chief Complaint: Physical (CPE)  HPI: 33 y.o. y/o AA male here for CPE.   He has a history of schizophrenia and depression. Sees psych routinely. States that he sees no other medical providers.  States that he has had no  " checkup"  since about 2007 so he felt that he probably should come in and have a medical checkup. Has no specific concerns or complaints today.   Review of Systems: Consitutional: No fever, chills, fatigue, night sweats, lymphadenopathy, or weight changes. Eyes: No visual changes, eye redness, or discharge. ENT/Mouth: Ears: No otalgia, tinnitus, hearing loss, discharge. Nose: No congestion, rhinorrhea, sinus pain, or epistaxis. Throat: No sore throat, post nasal drip, or teeth pain. Cardiovascular: No CP, palpitations, diaphoresis, DOE, edema, orthopnea, PND. Respiratory: No cough, hemoptysis, SOB, or wheezing. Gastrointestinal: No anorexia, dysphagia, reflux, pain, nausea, vomiting, hematemesis, diarrhea, constipation, BRBPR, or melena. Genitourinary: No dysuria, frequency, urgency, hematuria, incontinence, nocturia, decreased urinary stream, discharge, impotence, or testicular pain/masses. Musculoskeletal: No decreased ROM, myalgias, stiffness, joint swelling, or weakness. Skin: No rash, erythema, lesion changes, pain, warmth, jaundice, or pruritis. Neurological: No headache, dizziness, syncope, seizures, tremors, memory loss, coordination problems, or paresthesias. Psychological: No anxiety, depression, hallucinations, SI/HI. Endocrine: No fatigue, polydipsia, polyphagia, polyuria, or known diabetes. All other systems were reviewed and are otherwise negative.  Past Medical History  Diagnosis Date  . Schizophrenia   . Depression 06/2010     History reviewed. No pertinent past surgical history. he reports that he has had no surgeries.  Home  Meds:  Outpatient Prescriptions Prior to Visit  Medication Sig Dispense Refill  . ARIPiprazole (ABILIFY) 15 MG tablet Take 15 mg by mouth daily.      . butalbital-acetaminophen-caffeine (FIORICET) 50-325-40 MG per tablet Take 1 tablet by mouth every 6 (six) hours as needed for headache.  20 tablet  0   No facility-administered medications prior to visit.    Allergies:  Allergies  Allergen Reactions  . Pork-Derived Products Other (See Comments)    UNKNOWN    History   Social History  . Marital Status: Legally Separated    Spouse Name: N/A    Number of Children: N/A  . Years of Education: N/A   Occupational History  . Not on file.   Social History Main Topics  . Smoking status: Former Smoker    Quit date: 02/11/2002  . Smokeless tobacco: Never Used  . Alcohol Use: No  . Drug Use: No  . Sexual Activity: No   Other Topics Concern  . Not on file   Social History Narrative   Entered at PCP Office 07/2013:   On Disability   Has 2 children--2 girls--age 688 y/o and 2/ y/o   At home, it is him and the kids.   Says he is separated. Says he goes to gym 5 days per week. The children's mother will watch children while he goes to the gym.   Says he has the kids himself most of the time.    Says he wants to go back to school himself-- once his youngest child gets into school.     Family History  Problem Relation Age of Onset  . Depression Mother   . Diabetes Mother   . Cancer Paternal Grandfather [id number]    Colon Cancer  . Alcohol abuse Brother     Was Heavy drinker and smoker--has quit  There is no premature family history of CAD or CA  Physical Exam: Blood pressure 120/76, pulse 84, temperature 98.4 F (36.9 C), temperature source Oral, resp. rate 18, height 5' 8.5" (1.74 m), weight 214 lb (97.07 kg).  General: Well developed, well nourished, AAM. Appears in no acute distress. HEENT: Normocephalic, atraumatic. Conjunctiva pink, sclera non-icteric. Pupils 2 mm constricting  to 1 mm, round, regular, and equally reactive to light and accomodation. EOMI. Internal auditory canal clear. TMs with good cone of light and without pathology. Nasal mucosa pink. Nares are without discharge. No sinus tenderness. Oral mucosa pink. Dentition normal. Pharynx without exudate.   Neck: Supple. Trachea midline. No thyromegaly. Full ROM. No lymphadenopathy. Lungs: Clear to auscultation bilaterally without wheezes, rales, or rhonchi. Breathing is of normal effort and unlabored. Cardiovascular: RRR with S1 S2. No murmurs, rubs, or gallops. Distal pulses 2+ symmetrically. No carotid or abdominal bruits. Abdomen: Soft, non-tender, non-distended with normoactive bowel sounds. No hepatosplenomegaly or masses. No rebound/guarding. No CVA tenderness. No hernias. Musculoskeletal: Full range of motion and 5/5 strength throughout. Without swelling, atrophy, tenderness, crepitus, or warmth. Extremities without clubbing, cyanosis, or edema. Calves supple. Skin: Warm and moist without erythema, ecchymosis, wounds, or rash. Neuro: A+Ox3. CN II-XII grossly intact. Moves all extremities spontaneously. Full sensation throughout. Normal gait. DTR 2+ throughout upper and lower extremities. Finger to nose intact. Psych:  Responds to questions appropriately with a normal affect.   Assessment/Plan:  33 y.o. y/o AA male here for CPE  -1. Visit for preventive health examination  A. Screening Labs:  He is not fasting today but states that he can return fasting for lab work. - CBC with Differential; Future - COMPLETE METABOLIC PANEL WITH GFR; Future - Lipid panel; Future - TSH; Future  B. Immunizations: Tetanus-- he reports that he has had no tetanus shot in the past 10 years. He is agreeable to update today.  Signed:   [address] Beth WinchesterDixon,[place], [place]  08/01/2013 10:55 AM

## 2013-08-01 NOTE — Addendum Note (Signed)
Addended by: Donne AnonPLUMMER, Bernetha Anschutz M on: 08/01/2013 11:06 AM   Modules accepted: Orders

## 2013-08-18 NOTE — Progress Notes (Signed)
Patient had complete physical exam on 08/01/13. Call him and remind him to come in for fasting labs. Tell him that if he waits too long, his insurance company may not cover this as part of his physical.

## 2013-09-14 ENCOUNTER — Other Ambulatory Visit: Payer: Medicare Other

## 2013-09-14 DIAGNOSIS — Z79899 Other long term (current) drug therapy: Secondary | ICD-10-CM | POA: Diagnosis not present

## 2013-09-14 DIAGNOSIS — Z0000 Encounter for general adult medical examination without abnormal findings: Secondary | ICD-10-CM

## 2013-09-14 LAB — COMPLETE METABOLIC PANEL WITH GFR
ALT: 39 U/L (ref 0–53)
AST: 22 U/L (ref 0–37)
Albumin: 4 g/dL (ref 3.5–5.2)
Alkaline Phosphatase: 83 U/L (ref 39–117)
BUN: 10 mg/dL (ref 6–23)
CO2: 30 meq/L (ref 19–32)
CREATININE: 1.12 mg/dL (ref 0.50–1.35)
Calcium: 9.2 mg/dL (ref 8.4–10.5)
Chloride: 104 mEq/L (ref 96–112)
GFR, Est Non African American: 88 mL/min
Glucose, Bld: 90 mg/dL (ref 70–99)
Potassium: 4.6 mEq/L (ref 3.5–5.3)
Sodium: 140 mEq/L (ref 135–145)
Total Bilirubin: 0.4 mg/dL (ref 0.2–1.2)
Total Protein: 6.4 g/dL (ref 6.0–8.3)

## 2013-09-14 LAB — LIPID PANEL
CHOL/HDL RATIO: 3.9 ratio
CHOLESTEROL: 155 mg/dL (ref 0–200)
HDL: 40 mg/dL (ref >39)
LDL Cholesterol: 96 mg/dL (ref 0–99)
Triglycerides: 93 mg/dL (ref <150)
VLDL: 19 mg/dL (ref 0–40)

## 2013-09-14 LAB — CBC WITH DIFFERENTIAL/PLATELET
BASOS PCT: 0 % (ref 0–1)
Basophils Absolute: 0 10*3/uL (ref 0.0–0.1)
EOS ABS: 0.1 10*3/uL (ref 0.0–0.7)
Eosinophils Relative: 2 % (ref 0–5)
HCT: 39.8 % (ref 39.0–52.0)
Hemoglobin: 13.5 g/dL (ref 13.0–17.0)
LYMPHS ABS: 2.4 10*3/uL (ref 0.7–4.0)
Lymphocytes Relative: 44 % (ref 12–46)
MCH: 25.8 pg — AB (ref 26.0–34.0)
MCHC: 33.9 g/dL (ref 30.0–36.0)
MCV: 76.1 fL — ABNORMAL LOW (ref 78.0–100.0)
Monocytes Absolute: 0.3 10*3/uL (ref 0.1–1.0)
Monocytes Relative: 6 % (ref 3–12)
NEUTROS PCT: 48 % (ref 43–77)
Neutro Abs: 2.6 10*3/uL (ref 1.7–7.7)
PLATELETS: 271 10*3/uL (ref 150–400)
RBC: 5.23 MIL/uL (ref 4.22–5.81)
RDW: 13.9 % (ref 11.5–15.5)
WBC: 5.4 10*3/uL (ref 4.0–10.5)

## 2013-09-14 LAB — TSH: TSH: 2.163 u[IU]/mL (ref 0.350–4.500)

## 2013-09-17 ENCOUNTER — Encounter: Payer: Self-pay | Admitting: *Deleted

## 2013-10-08 DIAGNOSIS — F315 Bipolar disorder, current episode depressed, severe, with psychotic features: Secondary | ICD-10-CM | POA: Diagnosis not present

## 2014-01-01 DIAGNOSIS — F315 Bipolar disorder, current episode depressed, severe, with psychotic features: Secondary | ICD-10-CM | POA: Diagnosis not present

## 2014-02-27 ENCOUNTER — Ambulatory Visit (INDEPENDENT_AMBULATORY_CARE_PROVIDER_SITE_OTHER): Payer: Medicare Other | Admitting: Physician Assistant

## 2014-02-27 ENCOUNTER — Encounter: Payer: Self-pay | Admitting: Physician Assistant

## 2014-02-27 VITALS — BP 146/70 | HR 84 | Temp 98.8°F | Resp 18 | Wt 244.0 lb

## 2014-02-27 DIAGNOSIS — Z23 Encounter for immunization: Secondary | ICD-10-CM | POA: Diagnosis not present

## 2014-02-27 DIAGNOSIS — G44209 Tension-type headache, unspecified, not intractable: Secondary | ICD-10-CM | POA: Diagnosis not present

## 2014-02-27 MED ORDER — BUTALBITAL-APAP-CAFFEINE 50-325-40 MG PO TABS: 1.0000 | ORAL_TABLET | Freq: Four times a day (QID) | ORAL | Status: DC | PRN

## 2014-02-27 NOTE — Progress Notes (Signed)
    Patient ID: Brad Moreno MRN: [id number], DOB: 12/31/1980, 34 y.o. Date of Encounter: 02/27/2014, 1:40 PM    Chief Complaint:  Chief Complaint  Patient presents with  . has started having migraines again  . Flu Vaccine     HPI: 34 y.o. year old AA male says that over past 2 weeks he has started having headaches again. Says they come and go. Says "they may be b/c of stress."  Says he stays home all day every day with 253 y/o daughter.  Says his "son's mom died in August--since then pt has to take him to and from school, says his son has been getting in trouble at school."   Says headache is generalized, dull headache.  Not localized/focal area of pain.  No photophobia. No phonophobia. No N/V.  He has been using Ibuprofen--takes two, 4 times a day--on days he has h/a. Sometimes, when has h/a--lays down and rests 30 minutes then h/a resolves.   Says he is seeing MH. LOv there November. Is taking Abilify      Home Meds:   Outpatient Prescriptions Prior to Visit  Medication Sig Dispense Refill  . ARIPiprazole (ABILIFY) 15 MG tablet Take 15 mg by mouth daily.    . butalbital-acetaminophen-caffeine (FIORICET) 50-325-40 MG per tablet Take 1 tablet by mouth every 6 (six) hours as needed for headache. (Patient not taking: Reported on 02/27/2014) 20 tablet 0   No facility-administered medications prior to visit.    Allergies:  Allergies  Allergen Reactions  . Pork-Derived Products Other (See Comments)    UNKNOWN      Review of Systems: See HPI for pertinent ROS. All other ROS negative.    Physical Exam: Blood pressure 146/70, pulse 84, temperature 98.8 F (37.1 C), temperature source Oral, resp. rate 18, weight 244 lb (110.678 kg)., Body mass index is 36.56 kg/(m^2). General:  AAM. Appears in no acute distress. HEENT: Normocephalic, atraumatic, eyes without discharge, sclera non-icteric, nares are without discharge. Bilateral auditory canals clear, TM's are without  perforation, pearly grey and translucent with reflective cone of light bilaterally. Oral cavity moist, posterior pharynx without exudate, erythema, peritonsillar abscess. No tenderness with percussion of frontal or maxillary sinuses.  Neck: Supple. No thyromegaly. No lymphadenopathy. No tenderness with palpation of trapezius bilaterally.  Lungs: Clear bilaterally to auscultation without wheezes, rales, or rhonchi. Breathing is unlabored. Heart: Regular rhythm. No murmurs, rubs, or gallops. Msk:  Strength and tone normal for age. No tendernesswith palpation of neck, scapula bilaterally.  Extremities/Skin: Warm and dry. Neuro: Alert and oriented X 3. Moves all extremities spontaneously. Gait is normal. CNII-XII grossly in tact. Psych:  Responds to questions appropriately with a normal affect.     ASSESSMENT AND PLAN:  34 y.o. year old male with  1. Tension headache I told him he could cont otc ibuprfen, as this is controlling his symptoms.  He requests refill on Fioricet--says this worked very well for him in past. Says he could take just 1 and get complete relief and says one bottle lasted long time in past.  - butalbital-acetaminophen-caffeine (FIORICET) 50-325-40 MG per tablet; Take 1 tablet by mouth every 6 (six) hours as needed for headache.  Dispense: 60 tablet; Refill: 0  2. Need for prophylactic vaccination and inoculation against influenza - Flu Vaccine QUAD 36+ mos IM   Signed, [address]Iyari Hagner Beth SunsetDixon, [place], [place]ummit Amb[facility] Surgery CenterBSFM 02/27/2014 1:40 PM

## 2014-03-21 ENCOUNTER — Telehealth: Payer: Self-pay | Admitting: Family Medicine

## 2014-03-21 NOTE — Telephone Encounter (Signed)
Rec'd letter from Part D plan that pt But/Apap/Caf tab he uses prn HA is not on their formulary.  PA was submitted and have received approval.  Approved through 02/08/14 - 03/21/15.  Member ID [id number].  Copy of PA faxed to patient pharmacy

## 2014-04-08 DIAGNOSIS — F315 Bipolar disorder, current episode depressed, severe, with psychotic features: Secondary | ICD-10-CM | POA: Diagnosis not present

## 2014-10-24 ENCOUNTER — Encounter (HOSPITAL_COMMUNITY): Payer: Self-pay | Admitting: Emergency Medicine

## 2014-10-24 ENCOUNTER — Emergency Department (HOSPITAL_COMMUNITY)
Admission: EM | Admit: 2014-10-24 | Discharge: 2014-10-24 | Disposition: A | Discharge status: Home / Self Care | Payer: Medicaid Other | Attending: Emergency Medicine | Admitting: Emergency Medicine

## 2014-10-24 DIAGNOSIS — T63441A Toxic effect of venom of bees, accidental (unintentional), initial encounter: Secondary | ICD-10-CM | POA: Diagnosis not present

## 2014-10-24 DIAGNOSIS — F329 Major depressive disorder, single episode, unspecified: Secondary | ICD-10-CM | POA: Diagnosis not present

## 2014-10-24 DIAGNOSIS — Z79899 Other long term (current) drug therapy: Secondary | ICD-10-CM | POA: Diagnosis not present

## 2014-10-24 DIAGNOSIS — Y9289 Other specified places as the place of occurrence of the external cause: Secondary | ICD-10-CM | POA: Diagnosis not present

## 2014-10-24 DIAGNOSIS — Y998 Other external cause status: Secondary | ICD-10-CM | POA: Insufficient documentation

## 2014-10-24 DIAGNOSIS — X58XXXA Exposure to other specified factors, initial encounter: Secondary | ICD-10-CM | POA: Insufficient documentation

## 2014-10-24 DIAGNOSIS — F209 Schizophrenia, unspecified: Secondary | ICD-10-CM | POA: Diagnosis not present

## 2014-10-24 DIAGNOSIS — Z880 Allergy status to penicillin: Secondary | ICD-10-CM | POA: Diagnosis not present

## 2014-10-24 DIAGNOSIS — Y9389 Activity, other specified: Secondary | ICD-10-CM | POA: Diagnosis not present

## 2014-10-24 DIAGNOSIS — Z87891 Personal history of nicotine dependence: Secondary | ICD-10-CM | POA: Diagnosis not present

## 2014-10-24 MED ORDER — PREDNISONE 10 MG PO TABS
60.0000 mg | ORAL_TABLET | Freq: Once | ORAL | Status: AC
  Administered 2014-10-24: 60 mg via ORAL
  Filled 2014-10-24 (×2): qty 1

## 2014-10-24 MED ORDER — PREDNISONE 20 MG PO TABS: 40.0000 mg | ORAL_TABLET | Freq: Every day | ORAL | Status: DC

## 2014-10-24 MED ORDER — DIPHENHYDRAMINE HCL 25 MG PO CAPS
50.0000 mg | ORAL_CAPSULE | Freq: Once | ORAL | Status: AC
  Administered 2014-10-24: 50 mg via ORAL
  Filled 2014-10-24: qty 2

## 2014-10-24 MED ORDER — FAMOTIDINE 20 MG PO TABS
20.0000 mg | ORAL_TABLET | Freq: Once | ORAL | Status: AC
  Administered 2014-10-24: 20 mg via ORAL
  Filled 2014-10-24: qty 1

## 2014-10-24 MED ORDER — DIPHENHYDRAMINE HCL 25 MG PO TABS: 25.0000 mg | ORAL_TABLET | ORAL | Status: DC | PRN

## 2014-10-24 NOTE — ED Notes (Signed)
Pt given ice pack for right hand, tolerated well,

## 2014-10-24 NOTE — ED Notes (Signed)
Pt reports mult yellow jacket stings to his R arm at approx 1700 today.

## 2014-10-24 NOTE — Discharge Instructions (Signed)

## 2014-10-24 NOTE — ED Notes (Signed)
Pt driving, given 50 mgt benadryl to take when he gets home,

## 2014-10-26 NOTE — ED Provider Notes (Signed)
CSN: [id number]     Arrival date & time 10/24/14  2027 History   First MD Initiated Contact with Patient 10/24/14 2155     Chief Complaint  Patient presents with  . Insect Bite     (Consider location/radiation/quality/duration/timing/severity/associated sxs/prior Treatment) HPI   Brad Moreno is a 34 y.o. male who presents to the Emergency Department complaining of itching, pain and swelling to his right hand after several bee stings to his right hand and forearm.  He states he was stung at approximately 5:00 pm by several yellow jackets.  He has applied ice earlier to his hand, but noticed continued swelling and reports "soreness and tightness" to his hand with movement.  He has not taken any benadryl.  He denies hx of allergic rxn to bee stings, he also denies difficulty swallowing, shortness of breath, facial swelling, or hives.     Past Medical History  Diagnosis Date  . Schizophrenia   . Depression 06/2010   History reviewed. No pertinent past surgical history. Family History  Problem Relation Age of Onset  . Depression Mother   . Diabetes Mother   . Cancer Paternal Grandfather 54    Colon Cancer  . Alcohol abuse Brother     Was Heavy drinker and smoker--has quit   Social History  Substance Use Topics  . Smoking status: Former Smoker    Quit date: 02/11/2002  . Smokeless tobacco: Never Used  . Alcohol Use: No    Review of Systems  Constitutional: Negative for fever, chills, activity change and appetite change.  HENT: Negative for facial swelling, sore throat and trouble swallowing.   Respiratory: Negative for chest tightness, shortness of breath and wheezing.   Cardiovascular: Negative for chest pain.  Gastrointestinal: Negative for vomiting.  Musculoskeletal: Negative for neck pain and neck stiffness.  Skin: Negative for color change and wound.       Swelling, bee stings to the right hand and forearm  Neurological: Negative for dizziness, weakness, numbness  and headaches.  All other systems reviewed and are negative.     Allergies  Amoxicillin and Pork-derived products  Home Medications   Prior to Admission medications   Medication Sig Start Date End Date Taking? Authorizing Provider  ARIPiprazole (ABILIFY) 15 MG tablet Take 15 mg by mouth daily.    Historical Provider, MD  butalbital-acetaminophen-caffeine (FIORICET) [phone] MG per tablet Take 1 tablet by mouth every 6 (six) hours as needed for headache. 02/27/14 02/27/15  Dorena Bodo, PA-C  diphenhydrAMINE (BENADRYL) 25 MG tablet Take 1 tablet (25 mg total) by mouth every 4 (four) hours as needed. 10/24/14   Shriya Aker, PA-C  ibuprofen (ADVIL,MOTRIN) 200 MG tablet Take 200 mg by mouth every 6 (six) hours as needed for headache.    Historical Provider, MD  predniSONE (DELTASONE) 20 MG tablet Take 2 tablets (40 mg total) by mouth daily. For 5 days 10/24/14   Tilford Deaton, PA-C   BP 117/74 mmHg  Pulse 93  Temp(Src) 98.2 F (36.8 C) (Oral)  Resp 18  Ht 5\' 9"  (1.753 m)  Wt 185 lb (83.915 kg)  BMI 27.31 kg/m2  SpO2 100% Physical Exam  Constitutional: He is oriented to person, place, and time. He appears well-developed and well-nourished. No distress.  HENT:  Head: Normocephalic and atraumatic.  Mouth/Throat: Uvula is midline, oropharynx is clear and moist and mucous membranes are normal. No uvula swelling.  Airway patent, no edema of the lips, tongue or uvula  Neck: Normal range of  motion. Neck supple.  Cardiovascular: Normal rate, regular rhythm and intact distal pulses.   Pulmonary/Chest: Effort normal and breath sounds normal. No respiratory distress. He has no wheezes.  Musculoskeletal: Normal range of motion.  Pt has full ROM of the wrist and fingers of the right hand.  Distal sensation intact,  Radial pulse brisk, CR< 2 sec  Neurological: He is alert and oriented to person, place, and time.  Skin: Skin is warm and dry.  Pin point erythematous papule to the dorsal right  hand and several to the right forearm.  Mild to moderate edema of the dorsal hand.    Psychiatric: He has a normal mood and affect.  Nursing note and vitals reviewed.   ED Course  Procedures (including critical care time) Labs Review Labs Reviewed - No data to display  Imaging Review No results found. I have personally reviewed and evaluated these images and lab results as part of my medical decision-making.   EKG Interpretation None      MDM   Final diagnoses:  Bee sting, accidental or unintentional, initial encounter    Pt is well appearing.  Vitals stable.  Airway is patent, stings occurred approximately 3 hrs PTA, no clinical sx's of anaphylactic rxn.  Sx's appear localized to the dorsal hand.  Will tx with steroids and benadryl.  Advised pt to elevate the hand, cont ice and to return here for any worsening sx's.  He appears stable for d/c and agrees to plan,    Pauline Aus, PA-C 10/26/14 1234  Vanetta Mulders, MD 10/30/14 2053

## 2014-11-28 ENCOUNTER — Encounter (HOSPITAL_COMMUNITY): Payer: Self-pay | Admitting: *Deleted

## 2014-11-28 ENCOUNTER — Emergency Department (HOSPITAL_COMMUNITY)
Admission: EM | Admit: 2014-11-28 | Discharge: 2014-11-28 | Disposition: A | Discharge status: Home / Self Care | Payer: Medicaid Other | Attending: Emergency Medicine | Admitting: Emergency Medicine

## 2014-11-28 DIAGNOSIS — F329 Major depressive disorder, single episode, unspecified: Secondary | ICD-10-CM | POA: Diagnosis not present

## 2014-11-28 DIAGNOSIS — R109 Unspecified abdominal pain: Secondary | ICD-10-CM | POA: Insufficient documentation

## 2014-11-28 DIAGNOSIS — F209 Schizophrenia, unspecified: Secondary | ICD-10-CM | POA: Insufficient documentation

## 2014-11-28 DIAGNOSIS — R112 Nausea with vomiting, unspecified: Secondary | ICD-10-CM

## 2014-11-28 DIAGNOSIS — Z880 Allergy status to penicillin: Secondary | ICD-10-CM | POA: Diagnosis not present

## 2014-11-28 DIAGNOSIS — Z79899 Other long term (current) drug therapy: Secondary | ICD-10-CM | POA: Diagnosis not present

## 2014-11-28 DIAGNOSIS — Z87891 Personal history of nicotine dependence: Secondary | ICD-10-CM | POA: Diagnosis not present

## 2014-11-28 LAB — CBC WITH DIFFERENTIAL/PLATELET
BASOS ABS: 0 10*3/uL (ref 0.0–0.1)
Basophils Relative: 0 %
EOS ABS: 0.2 10*3/uL (ref 0.0–0.7)
EOS PCT: 2 %
HCT: 42.1 % (ref 39.0–52.0)
Hemoglobin: 14 g/dL (ref 13.0–17.0)
LYMPHS PCT: 43 %
Lymphs Abs: 3 10*3/uL (ref 0.7–4.0)
MCH: 26.6 pg (ref 26.0–34.0)
MCHC: 33.3 g/dL (ref 30.0–36.0)
MCV: 79.9 fL (ref 78.0–100.0)
Monocytes Absolute: 0.5 10*3/uL (ref 0.1–1.0)
Monocytes Relative: 7 %
Neutro Abs: 3.3 10*3/uL (ref 1.7–7.7)
Neutrophils Relative %: 48 %
PLATELETS: 204 10*3/uL (ref 150–400)
RBC: 5.27 MIL/uL (ref 4.22–5.81)
RDW: 15.3 % (ref 11.5–15.5)
WBC: 6.9 10*3/uL (ref 4.0–10.5)

## 2014-11-28 LAB — COMPREHENSIVE METABOLIC PANEL
ALT: 40 U/L (ref 17–63)
ANION GAP: 7 (ref 5–15)
AST: 33 U/L (ref 15–41)
Albumin: 4 g/dL (ref 3.5–5.0)
Alkaline Phosphatase: 76 U/L (ref 38–126)
BUN: 24 mg/dL — ABNORMAL HIGH (ref 6–20)
CALCIUM: 9.1 mg/dL (ref 8.9–10.3)
CHLORIDE: 105 mmol/L (ref 101–111)
CO2: 27 mmol/L (ref 22–32)
CREATININE: 1.08 mg/dL (ref 0.61–1.24)
Glucose, Bld: 140 mg/dL — ABNORMAL HIGH (ref 65–99)
Potassium: 3.7 mmol/L (ref 3.5–5.1)
Sodium: 139 mmol/L (ref 135–145)
Total Bilirubin: 0.6 mg/dL (ref 0.3–1.2)
Total Protein: 7 g/dL (ref 6.5–8.1)

## 2014-11-28 LAB — URINALYSIS, ROUTINE W REFLEX MICROSCOPIC
BILIRUBIN URINE: NEGATIVE
GLUCOSE, UA: NEGATIVE mg/dL
HGB URINE DIPSTICK: NEGATIVE
Ketones, ur: NEGATIVE mg/dL
Leukocytes, UA: NEGATIVE
Nitrite: NEGATIVE
PROTEIN: NEGATIVE mg/dL
Specific Gravity, Urine: >1.03 — ABNORMAL HIGH (ref 1.005–1.030)
Urobilinogen, UA: 0.2 mg/dL (ref 0.0–1.0)
pH: 5.5 (ref 5.0–8.0)

## 2014-11-28 LAB — LIPASE, BLOOD: Lipase: 32 U/L (ref 11–51)

## 2014-11-28 MED ORDER — FAMOTIDINE IN NACL 20-0.9 MG/50ML-% IV SOLN
20.0000 mg | Freq: Once | INTRAVENOUS | Status: AC
  Administered 2014-11-28: 20 mg via INTRAVENOUS
  Filled 2014-11-28: qty 50

## 2014-11-28 MED ORDER — ONDANSETRON HCL 4 MG/2ML IJ SOLN
4.0000 mg | INTRAMUSCULAR | Status: DC | PRN
  Filled 2014-11-28: qty 2

## 2014-11-28 MED ORDER — ONDANSETRON HCL 4 MG PO TABS: 4.0000 mg | ORAL_TABLET | Freq: Three times a day (TID) | ORAL | Status: DC | PRN

## 2014-11-28 MED ORDER — SODIUM CHLORIDE 0.9 % IV BOLUS (SEPSIS)
1000.0000 mL | Freq: Once | INTRAVENOUS | Status: AC
  Administered 2014-11-28: 1000 mL via INTRAVENOUS

## 2014-11-28 NOTE — ED Provider Notes (Signed)
CSN: [id number]     Arrival date & time 11/28/14  1846 History   First MD Initiated Contact with Patient 11/28/14 1854     Chief Complaint  Patient presents with  . Abdominal Pain      HPI Pt was seen at 1855. Per pt, c/o gradual onset and persistence of multiple intermittent episodes of N/V that began this morning. Has been associated with "aching" abd pain. Pt states there are others at his work with similar symptoms this week. Pt states he "feels dehydrated" so he came to the ED for IVF. Denies diarrhea, no CP/SOB, no back pain, no fevers, no black or blood in stools or emesis.    Past Medical History  Diagnosis Date  . Schizophrenia (HCC)   . Depression 06/2010   History reviewed. No pertinent past surgical history.   Family History  Problem Relation Age of Onset  . Depression Mother   . Diabetes Mother   . Cancer Paternal Grandfather 41    Colon Cancer  . Alcohol abuse Brother     Was Heavy drinker and smoker--has quit   Social History  Substance Use Topics  . Smoking status: Former Smoker    Quit date: 02/11/2002  . Smokeless tobacco: Never Used  . Alcohol Use: No    Review of Systems ROS: Statement: All systems negative except as marked or noted in the HPI; Constitutional: Negative for fever and chills. ; ; Eyes: Negative for eye pain, redness and discharge. ; ; ENMT: Negative for ear pain, hoarseness, nasal congestion, sinus pressure and sore throat. ; ; Cardiovascular: Negative for chest pain, palpitations, diaphoresis, dyspnea and peripheral edema. ; ; Respiratory: Negative for cough, wheezing and stridor. ; ; Gastrointestinal: +N/V, abd pain. Negative for diarrhea, blood in stool, hematemesis, jaundice and rectal bleeding. . ; ; Genitourinary: Negative for dysuria, flank pain and hematuria. ; ; Musculoskeletal: Negative for back pain and neck pain. Negative for swelling and trauma.; ; Skin: Negative for pruritus, rash, abrasions, blisters, bruising and skin lesion.; ;  Neuro: Negative for headache, lightheadedness and neck stiffness. Negative for weakness, altered level of consciousness , altered mental status, extremity weakness, paresthesias, involuntary movement, seizure and syncope.      Allergies  Amoxicillin and Pork-derived products  Home Medications   Prior to Admission medications   Medication Sig Start Date End Date Taking? Authorizing Provider  ARIPiprazole (ABILIFY) 15 MG tablet Take 15 mg by mouth daily.    Historical Provider, MD  butalbital-acetaminophen-caffeine (FIORICET) [phone] MG per tablet Take 1 tablet by mouth every 6 (six) hours as needed for headache. 02/27/14 02/27/15  Dorena Bodo, PA-C  diphenhydrAMINE (BENADRYL) 25 MG tablet Take 1 tablet (25 mg total) by mouth every 4 (four) hours as needed. 10/24/14   Tammy Triplett, PA-C  ibuprofen (ADVIL,MOTRIN) 200 MG tablet Take 200 mg by mouth every 6 (six) hours as needed for headache.    Historical Provider, MD  predniSONE (DELTASONE) 20 MG tablet Take 2 tablets (40 mg total) by mouth daily. For 5 days 10/24/14   Tammy Triplett, PA-C   BP 106/72 mmHg  Pulse 97  Temp(Src) 98.2 F (36.8 C) (Oral)  Resp 16  Ht  (1.753 m)  Wt 180 lb (81.647 kg)  BMI 26.57 kg/m2  SpO2 100% Physical Exam  1900: Physical examination:  Nursing notes reviewed; Vital signs and O2 SAT reviewed;  Constitutional: Well developed, Well nourished, In no acute distress; Head:  Normocephalic, atraumatic; Eyes: EOMI, PERRL, No scleral icterus;  ENMT: Mouth and pharynx normal, Mucous membranes dry; Neck: Supple, Full range of motion, No lymphadenopathy; Cardiovascular: Regular rate and rhythm, No murmur, rub, or gallop; Respiratory: Breath sounds clear & equal bilaterally, No rales, rhonchi, wheezes.  Speaking full sentences with ease, Normal respiratory effort/excursion; Chest: Nontender, Movement normal; Abdomen: Soft, Nontender, Nondistended, Normal bowel sounds; Genitourinary: No CVA tenderness; Extremities:  Pulses normal, No tenderness, No edema, No calf edema or asymmetry.; Neuro: AA&Ox3, Major CN grossly intact.  Speech clear. No gross focal motor or sensory deficits in extremities. Climbs on and off stretcher easily by himself. Gait steady.; Skin: Color normal, Warm, Dry.   ED Course  Procedures (including critical care time) Labs Review   Imaging Review  I have personally reviewed and evaluated these images and lab results as part of my medical decision-making.   EKG Interpretation None      MDM  MDM Reviewed: previous chart, nursing note and vitals Reviewed previous: labs Interpretation: labs      Results for orders placed or performed during the hospital encounter of 11/28/14  Urinalysis, Routine w reflex microscopic  Result Value Ref Range   Color, Urine YELLOW YELLOW   APPearance CLEAR CLEAR   Specific Gravity, Urine >1.030 (H) 1.005 - 1.030   pH 5.5 5.0 - 8.0   Glucose, UA NEGATIVE NEGATIVE mg/dL   Hgb urine dipstick NEGATIVE NEGATIVE   Bilirubin Urine NEGATIVE NEGATIVE   Ketones, ur NEGATIVE NEGATIVE mg/dL   Protein, ur NEGATIVE NEGATIVE mg/dL   Urobilinogen, UA 0.2 0.0 - 1.0 mg/dL   Nitrite NEGATIVE NEGATIVE   Leukocytes, UA NEGATIVE NEGATIVE  Comprehensive metabolic panel  Result Value Ref Range   Sodium 139 135 - 145 mmol/L   Potassium 3.7 3.5 - 5.1 mmol/L   Chloride 105 101 - 111 mmol/L   CO2 27 22 - 32 mmol/L   Glucose, Bld 140 (H) 65 - 99 mg/dL   BUN 24 (H) 6 - 20 mg/dL   Creatinine, Ser 4.091.08 0.61 - 1.24 mg/dL   Calcium 9.1 8.9 - 81.110.3 mg/dL   Total Protein 7.0 6.5 - 8.1 g/dL   Albumin 4.0 3.5 - 5.0 g/dL   AST 33 15 - 41 U/L   ALT 40 17 - 63 U/L   Alkaline Phosphatase 76 38 - 126 U/L   Total Bilirubin 0.6 0.3 - 1.2 mg/dL   GFR calc non Af Amer >60 >60 mL/min   GFR calc Af Amer >60 >60 mL/min   Anion gap 7 5 - 15  Lipase, blood  Result Value Ref Range   Lipase 32 11 - 51 U/L  CBC with Differential  Result Value Ref Range   WBC 6.9 4.0 - 10.5  K/uL   RBC 5.27 4.22 - 5.81 MIL/uL   Hemoglobin 14.0 13.0 - 17.0 g/dL   HCT 91.442.1 78.239.0 - 95.652.0 %   MCV 79.9 78.0 - 100.0 fL   MCH 26.6 26.0 - 34.0 pg   MCHC 33.3 30.0 - 36.0 g/dL   RDW 21.315.3 08.611.5 - 57.815.5 %   Platelets 204 150 - 400 K/uL   Neutrophils Relative % 48 %   Neutro Abs 3.3 1.7 - 7.7 K/uL   Lymphocytes Relative 43 %   Lymphs Abs 3.0 0.7 - 4.0 K/uL   Monocytes Relative 7 %   Monocytes Absolute 0.5 0.1 - 1.0 K/uL   Eosinophils Relative 2 %   Eosinophils Absolute 0.2 0.0 - 0.7 K/uL   Basophils Relative 0 %   Basophils Absolute  0.0 0.0 - 0.1 K/uL    2025:  Pt has tol PO well while in the ED without N/V.  No stooling while in the ED.  Abd remains benign, VSS. Feels better and wants to go home now. Tx symptomatically at this time. Dx and testing d/w pt.  Questions answered.  Verb understanding, agreeable to d/c home with outpt f/u.      Samuel Jester, DO 12/03/14 1226

## 2014-11-28 NOTE — Discharge Instructions (Signed)
°Emergency Department Resource Guide °1) Find a Doctor and Pay Out of Pocket °Although you won't have to find out who is covered by your insurance plan, it is a good idea to ask around and get recommendations. You will then need to call the office and see if the doctor you have chosen will accept you as a new patient and what types of options they offer for patients who are self-pay. Some doctors offer discounts or will set up payment plans for their patients who do not have insurance, but you will need to ask so you aren't surprised when you get to your appointment. ° °2) Contact Your Local Health Department °Not all health departments have doctors that can see patients for sick visits, but many do, so it is worth a call to see if yours does. If you don't know where your local health department is, you can check in your phone book. The CDC also has a tool to help you locate your state's health department, and many state websites also have listings of all of their local health departments. ° °3) Find a Walk-in Clinic °If your illness is not likely to be very severe or complicated, you may want to try a walk in clinic. These are popping up all over the country in pharmacies, drugstores, and shopping centers. They're usually staffed by nurse practitioners or physician assistants that have been trained to treat common illnesses and complaints. They're usually fairly quick and inexpensive. However, if you have serious medical issues or chronic medical problems, these are probably not your best option. ° °No Primary Care Doctor: °- Call Health Connect at  832-8000 - they can help you locate a primary care doctor that  accepts your insurance, provides certain services, etc. °- Physician Referral Service- 1-800-533-3463 ° °Chronic Pain Problems: °Organization         Address  Phone   Notes  °[facility]  (336) 297-2271 Patients need to be referred by their primary care doctor.  ° °Medication  Assistance: °Organization         Address  Phone   Notes  °[place] [facility] [address] °[place], [place] [address] (336) 641-8030 --Must be a resident of [place] °-- Must have NO insurance coverage whatsoever (no Medicaid/ Medicare, etc.) °-- The pt. MUST have a primary care doctor that directs their care regularly and follows them in the community °  °[facility]  (866) 331-1348   °[employer]  (888) 892-1162   ° °Agencies that provide inexpensive medical care: °Organization         Address  Phone   Notes  °[facility]  (336) 832-8035   °[facility]    (336) 832-7272   °[facility] [address] °[place], [place] [address] (336) 832-4777   °[facility] [address], °[place] (336) 271-4999   °[facility]    (336) 373-0678   °[facility]    (336) 272-1050   °[facility] ° [address], [place] Phone:  (336) 832-4444, Fax:  (336) 832-4440 Hours of Operation:  9 am - 6 pm, M-F.  Also accepts Medicaid/Medicare and self-pay.  °[facility] ° [address], [place] Phone: (336) 832-3150, Fax: (336) [phone]. Hours of Operation:  8:30 am - 5:30 pm, M-F.  Also accepts Medicaid and self-pay.  °[facility] [place] [address], [place] Phone: (336) 878-6027   °[facility] [address], [place], [place] (336)723-1848, Ext. 123 Mondays & Thursdays: 7-9 AM.  First 15 patients are seen on a first come, first serve basis. °  ° °Medicaid-accepting [place] Providers: ° °Organization         Address  Phone   Notes  °[facility] [address], [place] (336) 641-2100 Also accepts self-pay patients.  °[facility] [address], [place] ° (336) 856-9996   °[facility] [address], [place]  (336) 288-8857   °[facility] [address] [place] [address], [place] (336) 299-7000   °Veita Bland [address], [place]  ° (336) 373-1557 Only accepts [place] Access Medicaid patients after they have their name applied to their card.  ° °Self-Pay (no insurance) in [place]: ° °Organization         Address  Phone   Notes  °Sickle Cell Patients, [facility] [address], [place] (336) 832-1970   °[facility] [address], [place] (336) 832-4400   °[facility] ° [address], [place] (336) 992-4800   °[facility]/Dr. Osei-Bonsu ° [address] [place] [address], [place] or [address], [place] (336) 841-8500 Phone number for both [place] and [place] locations is the same.  °[facility] [address], [place] (336) 299-0000   °[facility] [address] [place] [address], [place] or [address] (336) 852-7530 °(336) 878-2260   °[facility] [address], [place] (336) 350-1642, phone; (336) 294-5005, fax Sees patients 1st and 3rd Saturday of every month.  Must not qualify for public or private insurance (i.e. Medicaid, Medicare, [place] Health Choice, Veterans' Benefits) • Household income should be no more than 200% of the poverty level •The clinic cannot treat you if you are pregnant or think you are pregnant • Sexually transmitted diseases are not treated at the clinic.  ° ° °Dental Care: °Organization         Address  Phone  Notes  °[place] [facility] [address], [place] (336) 641-6152 Accepts children up to age 21 who are enrolled in Medicaid or [place] Health Choice; pregnant women with a Medicaid card; and children who have applied for Medicaid or [place] Health Choice, but were declined, whose parents can pay a reduced fee at time of service.  °[place]  [facility] [place]  [address], [place] (336) 641-7733 Accepts children up to age 21 who are enrolled in Medicaid or [place] Health Choice; pregnant women with a Medicaid card; and children who have applied for Medicaid or [place] Health Choice, but were declined, whose parents can pay a reduced fee at time of service.  °[facility] ° [address], [place] (336) 641-4533 Patients are seen by appointment only. Walk-ins are not accepted. [facility] will see patients 18 years of age and older. °Monday - Tuesday (8am-5pm) °Most Wednesdays (8:30-5pm) °$30 per visit, cash only  °[facility] ° [address], [place] (336) 641-4533 Patients are seen by appointment only. Walk-ins are not accepted. [facility] will see patients 18 years of age and older. °One   Wednesday Evening (Monthly: Volunteer Based).  $30 per visit, cash only  °[facility]  (919) 537-3737 for adults; Children under age 4, call Graduate Pediatric Dentistry at (919) [phone]. Children aged 4-14, please call (919) [phone] to request a pediatric application. ° Dental services are provided in all areas of dental care including fillings, crowns and bridges, complete and partial dentures, implants, gum treatment, root canals, and extractions. Preventive care is also provided. Treatment is provided to both adults and children. °Patients are selected via a lottery and there is often a waiting list. °  °[facility] [address], °[place] ° (336) 763-8833 [email or web address] °  °[facility] [address], [place], [place] (336)723-1848, Ext. 123 Second and Fourth Thursday of each month, opens at 6:30 AM; Clinic ends at 9 AM.  Patients are seen on a first-come first-served basis, and a limited number are seen during each clinic.  ° °[facility] ° [address], [place], [place] (336) 723-7904    Eligibility Requirements °You must have lived in [place], [place], or [place] counties for at least the last three months. °  You cannot be eligible for state or federal sponsored healthcare insurance, including Veterans Administration, Medicaid, or Medicare. °  You generally cannot be eligible for healthcare insurance through your employer.  °  How to apply: °Eligibility screenings are held every Tuesday and Wednesday afternoon from 1:00 pm until 4:00 pm. You do not need an appointment for the interview!  °[facility] [address], [place], [place] 336-631-2330   °[facility]  336-342-8273   °[facility]  336-703-3100   °[facility]  336-570-6415   ° °Behavioral Health Resources in the Community: °Intensive Outpatient Programs °Organization         Address  Phone  Notes  °[facility] [address], [place], [place] 336-878-6098   °[facility] [address], [place], [place] 336-832-9800   °[facility] [address], [place], [place] ° 336-882-2125   °[facility] [address],  °[place], [place] 1-800-853-5163 or 336-641-4981   °Substance Abuse Resources °Organization         Address  Phone  Notes  °[facility]  336-882-2125   °[facility]  336-784-9470   °[facility]  336-285-9073   °[facility]  336-845-3988   °Residential & Outpatient Substance Abuse Program  1-800-659-3381   °Psychological Services °Organization         Address  Phone  Notes  °[facility]  336- 832-9600   °[facility]  336- 378-7881   °[facility] [address], [place] 1-800-853-5163 or 336-641-4981   ° °Mobile Crisis Teams °Organization         Address  Phone  Notes  °[facility], Mobile Crisis Care Unit  1-877-626-1772   °Assertive °[facility] ° [address]  [place], [place] 336-834-9664   °Sharon DeEsch [address] °[place] 336-554-5454   ° °Self-Help/Support Groups °Organization         Address  Phone             Notes  °[facility] of  - variety of support groups  336- 373-1402 Call for more information  °Narcotics Anonymous (NA), [facility] [address], °[place]  2 meetings at this location  ° °  Residential Treatment Programs Organization         Address  Phone  Notes  [facility] [address],    [place]  [phone]   [facility]  [address], [place] [id number], [place], [place] [phone]   [facility] [address], [place] [phone] Admissions: 8am-3pm M-F  [facility] [address]    [place], [place] [phone](904) [phone]   [facility] [address], [place], [place] [phone]) [phone]   [facility] [address]  [place], [place] [phone]   [facility] Intensive Outpatient [address], [place], [place] [phone]   [facility] ([facility] [address]  [place], [place] [phone] or [phone](215) [phone]   [facility] ([facility] [address] [place], [place] [phone]) [phone] Accepts Medicaid  [facility] [address]  [place] [phone] Substance Abuse/Addiction Treatment   [facility] Organization         Address  Phone  Notes  [facility]  [phone](888) [phone]   Angie FavaJulie Brannon, PhD [address], [address] [place], [place]   [phone](336) [phone] or [phone](336) [phone]   [facility]   [address] [place], [place] [phone](336) [phone]   [facility] [address], [place], [place] [phone](336) [phone] Insurance/Medicaid/sponsorship through [facility] [address]                                    [place], [place] [phone](336) [phone] Therapy/tele-psych/case    [facility] [address]   [place], [place] [phone](336) [phone]    Dr. Lolly MustacheArfeen  [phone](336) [phone]   [facility]  [employer] [facility] 1) [address], [place] 2) [address], [place] 3)  [address], [place] [phone](336) [phone](336) [phone]  [phone](336) [phone]   [facility] Child Abuse Hotline 403-868-3029(336) (916) 622-2718 or 7271264193(336) [phone] (After Hours)      Take the prescription as directed.  Increase your fluid intake (ie:  Gatoraide) for the next few days.  Eat a bland diet and advance to your regular diet slowly as you can tolerate it.  Call your regular medical doctor tomorrow to schedule a follow up appointment this week.  Return to the Emergency Department immediately if not improving (or even worsening) despite taking the medicines as prescribed, any black or bloody stool or vomit, if you develop a fever over "101," or for any other concerns.

## 2014-11-28 NOTE — ED Notes (Signed)
Patient reports abd pain with n/v that started today. Also reports headache.

## 2014-12-02 ENCOUNTER — Encounter: Payer: Medicare Other | Admitting: Physician Assistant

## 2014-12-05 ENCOUNTER — Ambulatory Visit (INDEPENDENT_AMBULATORY_CARE_PROVIDER_SITE_OTHER): Payer: Medicaid Other | Admitting: Physician Assistant

## 2014-12-05 ENCOUNTER — Encounter: Payer: Self-pay | Admitting: Physician Assistant

## 2014-12-05 VITALS — BP 132/84 | HR 80 | Temp 98.2°F | Resp 18 | Ht 69.0 in | Wt 193.0 lb

## 2014-12-05 DIAGNOSIS — Z0000 Encounter for general adult medical examination without abnormal findings: Secondary | ICD-10-CM | POA: Diagnosis not present

## 2014-12-05 DIAGNOSIS — Z113 Encounter for screening for infections with a predominantly sexual mode of transmission: Secondary | ICD-10-CM | POA: Diagnosis not present

## 2014-12-05 DIAGNOSIS — Z23 Encounter for immunization: Secondary | ICD-10-CM | POA: Diagnosis not present

## 2014-12-05 LAB — HEPATITIS PANEL, ACUTE
HCV AB: NEGATIVE
HEP B C IGM: NONREACTIVE
Hep A IgM: NONREACTIVE
Hepatitis B Surface Ag: NEGATIVE

## 2014-12-05 LAB — HIV ANTIBODY (ROUTINE TESTING W REFLEX): HIV 1&2 Ab, 4th Generation: NONREACTIVE

## 2014-12-05 NOTE — Progress Notes (Signed)
Patient ID: Brad Moreno MRN: [id number], DOB: 16-Apr-1980 34 y.o. Date of Encounter: 12/05/2014, 8:43 AM    Chief Complaint: Physical (CPE)  HPI: 34 y.o. y/o AA male here for CPE.   I reviewed my physical exam note from June 2015. At that time he reported that he has a history of schizophrenia and depression. [place] Sp[place]iel that he was seeing psych routinely. [place] that he had been seeing no other medical providers.  At that visit, stated that he had had no  " checkup"  since about 2007 so he felt that he probably should come in and have a medical checkup. Had no specific concerns or complaints that day.  Today--12/05/2014:  Followed up about this. He says that in April he was sent a letter stating that he is not receiving disability anymore. They had reviewed that he had not required any hospitalizations for multiple years etc. and that he was no longer going to be on disability. 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 Works 5 hours a day.  Today he also asked about STD screen. He says that he has been having no symptoms no penile discharge or dysuria etc. Says that he thought he should just have it checked because he hadn't had screen in years.  Reviewed that he did return fasting for labs on 09/14/13 and that his lipid panel was excellent and CBC TSH CME T were all normal. Status whether he wanted to repeat these today. He agrees that there is no reason to repeat these. He says that he actually has lost weight and is healthier now than he was back then.  Review of Systems: Consitutional: No fever, chills, fatigue, night sweats, lymphadenopathy, or weight changes. Eyes: No visual changes, eye redness, or discharge. ENT/Mouth: Ears: No otalgia, tinnitus, hearing loss, discharge. Nose: No congestion, rhinorrhea, sinus pain, or epistaxis. Throat: No sore throat, post nasal  drip, or teeth pain. Cardiovascular: No CP, palpitations, diaphoresis, DOE, edema, orthopnea, PND. Respiratory: No cough, hemoptysis, SOB, or wheezing. Gastrointestinal: No anorexia, dysphagia, reflux, pain, nausea, vomiting, hematemesis, diarrhea, constipation, BRBPR, or melena. Genitourinary: No dysuria, frequency, urgency, hematuria, incontinence, nocturia, decreased urinary stream, discharge, impotence, or testicular pain/masses. Musculoskeletal: No decreased ROM, myalgias, stiffness, joint swelling, or weakness. Skin: No rash, erythema, lesion changes, pain, warmth, jaundice, or pruritis. Neurological: No headache, dizziness, syncope, seizures, tremors, memory loss, coordination problems, or paresthesias. Psychological: No anxiety, depression, hallucinations, SI/HI. Endocrine: No fatigue, polydipsia, polyphagia, polyuria, or known diabetes. All other systems were reviewed and are otherwise negative.  Past Medical History  Diagnosis Date  . Schizophrenia (HCC)   . Depression 06/2010     History reviewed. No pertinent past surgical history. he reports that he has had no surgeries.  Home Meds:  Outpatient Prescriptions Prior to Visit  Medication Sig Dispense Refill  . Multiple Vitamin (MULTIVITAMIN WITH MINERALS) TABS tablet Take 1 tablet by mouth daily.    . ondansetron (ZOFRAN) 4 MG tablet Take 1 tablet (4 mg total) by mouth every 8 (eight) hours as needed for nausea or vomiting. 6 tablet 0  . tetrahydrozoline-zinc (VISINE-AC) 0.05-0.25 % ophthalmic solution Place 2 drops into both eyes 3 (three) times daily as needed.     No facility-administered medications prior to visit.    Allergies:  Allergies  Allergen Reactions  . Amoxicillin Other (See Comments)  Unknown reaction when he was young  . Pork-Derived Products Other (See Comments)    UNKNOWN    Social History   Social History  . Marital Status: Legally Separated    Spouse Name: N/A  . Number of Children: N/A  .  Years of Education: N/A   Occupational History  . Not on file.   Social History Main Topics  . Smoking status: Former Smoker    Quit date: 02/11/2002  . Smokeless tobacco: Never Used  . Alcohol Use: No  . Drug Use: No  . Sexual Activity: No   Other Topics Concern  . Not on file   Social History Narrative   Entered at PCP Office 07/2013:   On Disability   Has 2 children--2 girls--age 32 y/o and 2/ y/o   At home, it is him and the kids.   Says he is separated. Says he goes to gym 5 days per week. The children's mother will watch children while he goes to the gym.   Says he has the kids himself most of the time.    Says he wants to go back to school himself-- once his youngest child gets into school.     Family History  Problem Relation Age of Onset  . Depression Mother   . Diabetes Mother   . Cancer Paternal Grandfather 50    Colon Cancer  . Alcohol abuse Brother     Was Heavy drinker and smoker--has quit   There is no premature family history of CAD or CA  Physical Exam: Blood pressure 132/84, pulse 80, temperature 98.2 F (36.8 C), temperature source Oral, resp. rate 18, height  (1.753 m), weight 193 lb (87.544 kg).  General: Well developed, well nourished, AAM. Appears in no acute distress. HEENT: Normocephalic, atraumatic. Conjunctiva pink, sclera non-icteric. Pupils 2 mm constricting to 1 mm, round, regular, and equally reactive to light and accomodation. EOMI. Internal auditory canal clear. TMs with good cone of light and without pathology. Nasal mucosa pink. Nares are without discharge. No sinus tenderness. Oral mucosa pink. Dentition normal. Pharynx without exudate.   Neck: Supple. Trachea midline. No thyromegaly. Full ROM. No lymphadenopathy. Lungs: Clear to auscultation bilaterally without wheezes, rales, or rhonchi. Breathing is of normal effort and unlabored. Cardiovascular: RRR with S1 S2. No murmurs, rubs, or gallops. Distal pulses 2+ symmetrically. No  carotid or abdominal bruits. Abdomen: Soft, non-tender, non-distended with normoactive bowel sounds. No hepatosplenomegaly or masses. No rebound/guarding. No CVA tenderness. No hernias. Musculoskeletal: Full range of motion and 5/5 strength throughout. Skin: Warm and moist without erythema, ecchymosis, wounds, or rash. Neuro: A+Ox3. CN II-XII grossly intact. Moves all extremities spontaneously. Full sensation throughout. Normal gait. DTR 2+ throughout upper and lower extremities. Finger to nose intact. Psych:  Responds to questions appropriately with a normal affect.   Assessment/Plan:  34 y.o. y/o AA male here for CPE  -1. Visit for preventive health examination  A. Screening Labs:  He did return fasting for labs on 09/14/13 and these were all normal. The history of present illness regarding our discussion today. He is agreeable that there is no reason to re-peaked these at this point.  B. Immunizations: Tetanus-- Given here 08/01/2013 Influenza vaccine--- given here 12/05/2014  2. Screening for STDs (sexually transmitted diseases) - Hepatitis panel, acute - HIV antibody - HSV(herpes smplx)abs-1+2(IgG+IgM)-bld - RPR  3. Need for prophylactic vaccination and inoculation against influenza - Flu Vaccine QUAD 36+ mos IM   Signed:   [facility],  BSFM  12/05/2014 8:43 AM

## 2014-12-06 LAB — HSV(HERPES SMPLX)ABS-I+II(IGG+IGM)-BLD
HSV 2 Glycoprotein G Ab, IgG: 6.6 IV — ABNORMAL HIGH
Herpes Simplex Vrs I&II-IgM Ab (EIA): 4.55 INDEX — ABNORMAL HIGH

## 2014-12-06 LAB — RPR

## 2014-12-14 ENCOUNTER — Emergency Department (HOSPITAL_COMMUNITY)
Admission: EM | Admit: 2014-12-14 | Discharge: 2014-12-15 | Payer: Medicaid Other | Attending: Emergency Medicine | Admitting: Emergency Medicine

## 2014-12-14 ENCOUNTER — Encounter (HOSPITAL_COMMUNITY): Payer: Self-pay | Admitting: *Deleted

## 2014-12-14 DIAGNOSIS — S30851A Superficial foreign body of abdominal wall, initial encounter: Secondary | ICD-10-CM | POA: Insufficient documentation

## 2014-12-14 DIAGNOSIS — Y9389 Activity, other specified: Secondary | ICD-10-CM | POA: Diagnosis not present

## 2014-12-14 DIAGNOSIS — F23 Brief psychotic disorder: Secondary | ICD-10-CM | POA: Diagnosis not present

## 2014-12-14 DIAGNOSIS — Y9289 Other specified places as the place of occurrence of the external cause: Secondary | ICD-10-CM | POA: Diagnosis not present

## 2014-12-14 DIAGNOSIS — Z880 Allergy status to penicillin: Secondary | ICD-10-CM | POA: Insufficient documentation

## 2014-12-14 DIAGNOSIS — Z79899 Other long term (current) drug therapy: Secondary | ICD-10-CM | POA: Insufficient documentation

## 2014-12-14 DIAGNOSIS — Y998 Other external cause status: Secondary | ICD-10-CM | POA: Diagnosis not present

## 2014-12-14 DIAGNOSIS — Z046 Encounter for general psychiatric examination, requested by authority: Secondary | ICD-10-CM | POA: Diagnosis present

## 2014-12-14 DIAGNOSIS — W268XXA Contact with other sharp object(s), not elsewhere classified, initial encounter: Secondary | ICD-10-CM | POA: Diagnosis not present

## 2014-12-14 DIAGNOSIS — Z87891 Personal history of nicotine dependence: Secondary | ICD-10-CM | POA: Diagnosis not present

## 2014-12-14 DIAGNOSIS — R000 Tachycardia, unspecified: Secondary | ICD-10-CM | POA: Diagnosis not present

## 2014-12-14 DIAGNOSIS — F1310 Sedative, hypnotic or anxiolytic abuse, uncomplicated: Secondary | ICD-10-CM | POA: Diagnosis not present

## 2014-12-14 DIAGNOSIS — R9431 Abnormal electrocardiogram [ECG] [EKG]: Secondary | ICD-10-CM | POA: Insufficient documentation

## 2014-12-14 LAB — CBC WITH DIFFERENTIAL/PLATELET
BASOS PCT: 0 %
Basophils Absolute: 0 10*3/uL (ref 0.0–0.1)
EOS ABS: 0.1 10*3/uL (ref 0.0–0.7)
Eosinophils Relative: 1 %
HEMATOCRIT: 44.7 % (ref 39.0–52.0)
Hemoglobin: 14.8 g/dL (ref 13.0–17.0)
Lymphocytes Relative: 28 %
Lymphs Abs: 2.5 10*3/uL (ref 0.7–4.0)
MCH: 26.1 pg (ref 26.0–34.0)
MCHC: 33.1 g/dL (ref 30.0–36.0)
MCV: 78.7 fL (ref 78.0–100.0)
MONO ABS: 0.7 10*3/uL (ref 0.1–1.0)
MONOS PCT: 7 %
NEUTROS ABS: 5.9 10*3/uL (ref 1.7–7.7)
Neutrophils Relative %: 64 %
Platelets: 251 10*3/uL (ref 150–400)
RBC: 5.68 MIL/uL (ref 4.22–5.81)
RDW: 15.3 % (ref 11.5–15.5)
WBC: 9.1 10*3/uL (ref 4.0–10.5)

## 2014-12-14 LAB — COMPREHENSIVE METABOLIC PANEL
ALBUMIN: 4.4 g/dL (ref 3.5–5.0)
ALK PHOS: 86 U/L (ref 38–126)
ALT: 56 U/L (ref 17–63)
AST: 53 U/L — AB (ref 15–41)
Anion gap: 11 (ref 5–15)
BILIRUBIN TOTAL: 0.7 mg/dL (ref 0.3–1.2)
BUN: 16 mg/dL (ref 6–20)
CALCIUM: 9.4 mg/dL (ref 8.9–10.3)
CO2: 26 mmol/L (ref 22–32)
Chloride: 105 mmol/L (ref 101–111)
Creatinine, Ser: 1.3 mg/dL — ABNORMAL HIGH (ref 0.61–1.24)
GFR calc Af Amer: >60 mL/min (ref >60)
GFR calc non Af Amer: >60 mL/min (ref >60)
GLUCOSE: 166 mg/dL — AB (ref 65–99)
Potassium: 3.5 mmol/L (ref 3.5–5.1)
Sodium: 142 mmol/L (ref 135–145)
TOTAL PROTEIN: 7.5 g/dL (ref 6.5–8.1)

## 2014-12-14 LAB — ETHANOL: Alcohol, Ethyl (B): <5 mg/dL (ref <5)

## 2014-12-14 LAB — MAGNESIUM: MAGNESIUM: 2 mg/dL (ref 1.7–2.4)

## 2014-12-14 LAB — CBG MONITORING, ED: GLUCOSE-CAPILLARY: 119 mg/dL — AB (ref 65–99)

## 2014-12-14 MED ORDER — LIDOCAINE HCL (PF) 1 % IJ SOLN
5.0000 mL | Freq: Once | INTRAMUSCULAR | Status: AC
  Administered 2014-12-14: 5 mL via INTRADERMAL

## 2014-12-14 MED ORDER — ZIPRASIDONE MESYLATE 20 MG IM SOLR
20.0000 mg | Freq: Once | INTRAMUSCULAR | Status: AC
  Administered 2014-12-14: 20 mg via INTRAMUSCULAR
  Filled 2014-12-14: qty 20

## 2014-12-14 MED ORDER — SODIUM CHLORIDE 0.9 % IV BOLUS (SEPSIS)
1000.0000 mL | Freq: Once | INTRAVENOUS | Status: AC
  Administered 2014-12-14: 1000 mL via INTRAVENOUS

## 2014-12-14 MED ORDER — LIDOCAINE HCL (PF) 1 % IJ SOLN
INTRAMUSCULAR | Status: AC
  Administered 2014-12-14: 5 mL via INTRADERMAL
  Filled 2014-12-14: qty 5

## 2014-12-14 MED ORDER — STERILE WATER FOR INJECTION IJ SOLN
INTRAMUSCULAR | Status: AC
Start: 2014-12-14 — End: 2014-12-14
  Administered 2014-12-14: 23:00:00
  Filled 2014-12-14: qty 10

## 2014-12-14 NOTE — ED Notes (Signed)
Pt is arrested and in police custody. [place]azed [place] [employer]. [employer] state that pt only needs to be medically cleared to go to jail. Tazer spike remains to groin area which needs to be removed. EMS stated on HR was 150s on scene.
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
## 2014-12-14 NOTE — ED Notes (Signed)
Patient refusing to talk to TTS at this time.

## 2014-12-14 NOTE — ED Provider Notes (Signed)
CSN: [id number]     Arrival date & time 12/14/14  1708 History   First MD Initiated Contact with Patient 12/14/14 1746     Chief Complaint  Patient presents with  . V70.1     (Consider location/radiation/quality/duration/timing/severity/associated sxs/prior Treatment) HPI Comments: 34 year old male with history of schizophrenia, depression presents with police custody after he was found doing inappropriate gestures [place]. Patient will not give details. Per police report when they arrived he started throwing rocks at their window and broke the windows. Patient required handcuffs as he would not cooperate. Patient will not details of his psychiatric medications or history. Unknown if he has been taking them. Patient denies illegal drugs. Patient was dazed in the groin by police.  The history is provided by the patient, medical records and the police.    Past Medical History  Diagnosis Date  . Schizophrenia (HCC)   . Depression 06/2010   History reviewed. No pertinent past surgical history. Family History  Problem Relation Age of Onset  . Depression Mother   . Diabetes Mother   . Cancer Paternal Grandfather 42    Colon Cancer  . Alcohol abuse Brother     Was Heavy drinker and smoker--has quit   Social History  Substance Use Topics  . Smoking status: Former Smoker    Quit date: 02/11/2002  . Smokeless tobacco: Never Used  . Alcohol Use: No    Review of Systems  Unable to perform ROS: Psychiatric disorder      Allergies  Amoxicillin and Pork-derived products  Home Medications   Prior to Admission medications   Medication Sig Start Date End Date Taking? Authorizing Provider  Multiple Vitamin (MULTIVITAMIN WITH MINERALS) TABS tablet Take 1 tablet by mouth daily.    Historical Provider, MD  ondansetron (ZOFRAN) 4 MG tablet Take 1 tablet (4 mg total) by mouth every 8 (eight) hours as needed for nausea or vomiting. 11/28/14   Samuel Jester, DO   tetrahydrozoline-zinc (VISINE-AC) 0.05-0.25 % ophthalmic solution Place 2 drops into both eyes 3 (three) times daily as needed.    Historical Provider, MD   BP 145/90 mmHg  Pulse 110  Temp(Src) 99.5 F (37.5 C) (Oral)  Resp 18  Wt 193 lb (87.544 kg)  SpO2 100% Physical Exam  Constitutional: He appears well-developed and well-nourished.  HENT:  Head: Normocephalic and atraumatic.  Dry mucous membranes  Eyes: Conjunctivae are normal. Right eye exhibits no discharge. Left eye exhibits no discharge.  Neck: Normal range of motion. Neck supple. No tracheal deviation present.  Cardiovascular: Regular rhythm.  Tachycardia present.   Pulmonary/Chest: Effort normal and breath sounds normal.  Abdominal: Soft. He exhibits no distension. There is no tenderness. There is no guarding.  Musculoskeletal: He exhibits no edema.  Neurological: He is alert.  Difficult exam as patient will not cooperate. Horizontal eye movements intact, pupils equal bilateral, equal strength grossly upper and lower extremities, sensation to pain intact. Patient will have a few sudden bursts of verbal and then will be quiet for prolonged time.  Skin: Skin is warm. No rash noted.  Psychiatric: His affect is blunt. His speech is rapid and/or pressured.  Nursing note and vitals reviewed.   ED Course  Procedures (including critical care time) Foreign body removal Indication tazer barb in suprapubic area LIdocaine used, sterilized. Scalpel incision .5 cm followed by removal with plyers. No complications Performed by myself.   Labs Review Labs Reviewed  COMPREHENSIVE METABOLIC PANEL - Abnormal; Notable for the following:  Glucose, Bld 166 (*)    Creatinine, Ser 1.30 (*)    AST 53 (*)    All other components within normal limits  CBG MONITORING, ED - Abnormal; Notable for the following:    Glucose-Capillary 119 (*)    All other components within normal limits  ETHANOL  CBC WITH DIFFERENTIAL/PLATELET  MAGNESIUM   URINE RAPID DRUG SCREEN, HOSP PERFORMED    Imaging Review No results found. I have personally reviewed and evaluated these images and lab results as part of my medical decision-making.   EKG Interpretation None     EKG reviewed heart rate 124 sinus tachycardia regular, prolonged QT, nonspecific T-wave changes. MDM   Final diagnoses:  Acute psychosis  Prolonged Q-T interval on ECG   Patient presents with police custody for concern for acute psychosis. Patient is under arrest from police. Difficulty obtaining information as patient will not cooperate. Foreign body from Taser removed on arrival. Heart rate elevated QT prolonged. IV fluid bolus given. Discussed with psychiatry/behavioral health to assist and likely inpatient admission.  Patient would not talk to behavior health. Plan for repeat assessment at 9 PM. Repeat EKG showed normal QT. Officers at the bedside.  Differential diagnosis were considered with the presenting HPI.  Medications  sodium chloride 0.9 % bolus 1,000 mL (0 mLs Intravenous Stopped 12/14/14 1900)  lidocaine (PF) (XYLOCAINE) 1 % injection 5 mL (5 mLs Intradermal Given by Other 12/14/14 1804)    Filed Vitals:   12/14/14 1720 12/14/14 1805 12/14/14 1900  BP: 152/97 146/89 145/90  Pulse: 124 113 110  Temp: 99.5 F (37.5 C)    TempSrc: Oral    Resp: 22 18 18   Weight: 193 lb (87.544 kg)    SpO2: 100% 100% 100%    Final diagnoses:  Acute psychosis  Prolonged Q-T interval on ECG      Blane OharaJoshua Tabbatha Bordelon, MD 12/14/14 1931

## 2014-12-14 NOTE — ED Notes (Signed)
Pt in custody of [employer]. Pt talking to self at random. Cussing at random.

## 2014-12-14 NOTE — BH Assessment (Signed)
Attempted tele-assessment but Pt refused to answer any questions. Asked ED staff to please contact TTS when Pt is willing to participate.  Harlin RainFord Ellis Patsy BaltimoreWarrick Jr, LPC, [facility], [facility] Triage Specialist [phone]

## 2014-12-14 NOTE — ED Notes (Signed)
Pt highly agitated. Cursing. Shaking bed. Orders given for Geodon 20mg  IM.

## 2014-12-15 LAB — RAPID URINE DRUG SCREEN, HOSP PERFORMED
Amphetamines: NOT DETECTED
BARBITURATES: POSITIVE — AB
Benzodiazepines: NOT DETECTED
Cocaine: NOT DETECTED
Opiates: NOT DETECTED
Tetrahydrocannabinol: NOT DETECTED

## 2014-12-15 MED ORDER — LORAZEPAM 2 MG/ML IJ SOLN
2.0000 mg | Freq: Once | INTRAMUSCULAR | Status: AC
  Administered 2014-12-15: 2 mg via INTRAMUSCULAR
  Filled 2014-12-15: qty 1

## 2014-12-15 NOTE — ED Provider Notes (Signed)
Patient fails to cooperate with Behavioral Health interviews. We have attempted 3 times. I told the patient on the third time that if he did not cooperate with family [place] release him into the custody of the police. He would not cooperate he was cussing at the behavioral health person. Patient's behavioral prior to this has appeared to be fairly normal patient understood that if he didn't cooperate with behavioral health he was given a be released into the custody of the police. Patient did not show any obvious signs of hallucinations. No clearcut formal thought disorder. Patient reasonably stable for discharge. Behavioral Health in the attempt of the interview also agreed that he seemed stable enough to be discharged.    Vanetta MuldersScott Bland Rudzinski, MD 12/15/14 [phone]

## 2014-12-15 NOTE — ED Notes (Signed)
Patient agreeable to having tele-psych at this time.  Consult ordered. [facility], will keep trying.

## 2014-12-15 NOTE — ED Notes (Signed)
Pt awake, cursing at staff and police officers at bedside,

## 2014-12-15 NOTE — ED Notes (Signed)
TTS consult in progress. °

## 2014-12-15 NOTE — ED Notes (Signed)
Patient d/c papers given. Patients debit card, cigarettes, lighter given at d/c. Patient discharged into sheriff custody.

## 2014-12-15 NOTE — BH Assessment (Signed)
12/15/14 0900 Assessment attempted with pt at this time.  Pt continues to be argumentative and uncooperative.  Pt spent most of the attempted assessment cursing.  Pt finally said he had to "take a shit" and could not talk until afterwards.  TTS spoke with Dr Saul FordyceZacowski of [employer] who agreed that due to pt repeated uncooperative behavior, he would release pt to police custody. Daleen SquibbGreg Kylie Simmonds, LCSW

## 2014-12-15 NOTE — ED Notes (Signed)
Patient given urinal to collect urine.

## 2014-12-15 NOTE — ED Notes (Signed)
Per EDP, he has spoken with pt and told him he will give him one more chance for the TTS. If he is not cooperative he will be discharged to jail

## 2014-12-15 NOTE — ED Notes (Signed)
Patient uncooperative with TTS consult and will be discharged into custody of sheriff as discussed earlier.

## 2014-12-15 NOTE — ED Notes (Signed)
Patient has been completely alert and cooperative this morning. When patient told that we will place the monitor in his room for tele-psych, patient began to talk incoherently and rolling eyes in the back of his head.

## 2014-12-15 NOTE — Discharge Instructions (Signed)
Resource guide provided below for follow-up as needed with behavioral health or primary care doctor.

## 2015-01-01 ENCOUNTER — Emergency Department (HOSPITAL_COMMUNITY)
Admission: EM | Admit: 2015-01-01 | Discharge: 2015-01-01 | Payer: Medicaid Other | Attending: Emergency Medicine | Admitting: Emergency Medicine

## 2015-01-01 ENCOUNTER — Encounter (HOSPITAL_COMMUNITY): Payer: Self-pay | Admitting: Emergency Medicine

## 2015-01-01 DIAGNOSIS — F1310 Sedative, hypnotic or anxiolytic abuse, uncomplicated: Secondary | ICD-10-CM | POA: Diagnosis not present

## 2015-01-01 DIAGNOSIS — Z79899 Other long term (current) drug therapy: Secondary | ICD-10-CM | POA: Insufficient documentation

## 2015-01-01 DIAGNOSIS — Z87891 Personal history of nicotine dependence: Secondary | ICD-10-CM | POA: Insufficient documentation

## 2015-01-01 DIAGNOSIS — Z046 Encounter for general psychiatric examination, requested by authority: Secondary | ICD-10-CM | POA: Diagnosis present

## 2015-01-01 DIAGNOSIS — Z880 Allergy status to penicillin: Secondary | ICD-10-CM | POA: Diagnosis not present

## 2015-01-01 DIAGNOSIS — F3160 Bipolar disorder, current episode mixed, unspecified: Secondary | ICD-10-CM | POA: Insufficient documentation

## 2015-01-01 LAB — COMPREHENSIVE METABOLIC PANEL
ALK PHOS: 72 U/L (ref 38–126)
ALT: 28 U/L (ref 17–63)
AST: 27 U/L (ref 15–41)
Albumin: 4.2 g/dL (ref 3.5–5.0)
Anion gap: 5 (ref 5–15)
BUN: 11 mg/dL (ref 6–20)
CO2: 31 mmol/L (ref 22–32)
Calcium: 9.3 mg/dL (ref 8.9–10.3)
Chloride: 102 mmol/L (ref 101–111)
Creatinine, Ser: 1.04 mg/dL (ref 0.61–1.24)
Glucose, Bld: 109 mg/dL — ABNORMAL HIGH (ref 65–99)
Potassium: 4.6 mmol/L (ref 3.5–5.1)
Sodium: 138 mmol/L (ref 135–145)
TOTAL PROTEIN: 7 g/dL (ref 6.5–8.1)
Total Bilirubin: 0.5 mg/dL (ref 0.3–1.2)

## 2015-01-01 LAB — CBC
HEMATOCRIT: 42.9 % (ref 39.0–52.0)
HEMOGLOBIN: 14.2 g/dL (ref 13.0–17.0)
MCH: 26.9 pg (ref 26.0–34.0)
MCHC: 33.1 g/dL (ref 30.0–36.0)
MCV: 81.4 fL (ref 78.0–100.0)
Platelets: 221 10*3/uL (ref 150–400)
RBC: 5.27 MIL/uL (ref 4.22–5.81)
RDW: 14.4 % (ref 11.5–15.5)
WBC: 6.3 10*3/uL (ref 4.0–10.5)

## 2015-01-01 LAB — RAPID URINE DRUG SCREEN, HOSP PERFORMED
Amphetamines: NOT DETECTED
BARBITURATES: POSITIVE — AB
BENZODIAZEPINES: NOT DETECTED
COCAINE: NOT DETECTED
OPIATES: NOT DETECTED
TETRAHYDROCANNABINOL: NOT DETECTED

## 2015-01-01 LAB — ETHANOL

## 2015-01-01 MED ORDER — ZOLPIDEM TARTRATE 5 MG PO TABS: 5.0000 mg | ORAL_TABLET | Freq: Every evening | ORAL | Status: DC | PRN

## 2015-01-01 MED ORDER — ONDANSETRON HCL 4 MG PO TABS: 4.0000 mg | ORAL_TABLET | Freq: Three times a day (TID) | ORAL | Status: DC | PRN

## 2015-01-01 MED ORDER — LORAZEPAM 1 MG PO TABS: 1.0000 mg | ORAL_TABLET | Freq: Three times a day (TID) | ORAL | Status: DC | PRN

## 2015-01-01 MED ORDER — ALUM & MAG HYDROXIDE-SIMETH 200-200-20 MG/5ML PO SUSP: 30.0000 mL | ORAL | Status: DC | PRN

## 2015-01-01 MED ORDER — ACETAMINOPHEN 325 MG PO TABS: 650.0000 mg | ORAL_TABLET | ORAL | Status: DC | PRN

## 2015-01-01 MED ORDER — IBUPROFEN 400 MG PO TABS: 600.0000 mg | ORAL_TABLET | Freq: Three times a day (TID) | ORAL | Status: DC | PRN

## 2015-01-01 NOTE — ED Notes (Signed)
Pt speaking with TTS consult.

## 2015-01-01 NOTE — ED Provider Notes (Signed)
Sherif department has now presented paperwork from a judge to [facility] the [facility].  Patient be discharged into law enforcement custody to carry out the wishes of the judge  [name], MD 01/01/15 1354

## 2015-01-01 NOTE — ED Notes (Addendum)
Officer presented with a judges order for pt to be immediately transferred to [facility].

## 2015-01-01 NOTE — ED Notes (Addendum)
Pt escorted by [employer] officer under IVC. Per Officer pt brought in after court date today.Officer reports that patient was throwing rocks at a police car and was tazed last weekend and had to have medical treatment then. Officer reports that pt mother took paperwork out today for auditory hallucinations and not taking medication. Pt denies SI/HI. Pt agitated and answers questions intermittently. Some paranoia noted in triage. Pt in handcuffs at time of arrival to ED.

## 2015-01-01 NOTE — ED Notes (Signed)
Officers awaiting backup for transfer.

## 2015-01-01 NOTE — ED Notes (Signed)
Pt cooperative with changing into scrubs. Police at bedside.

## 2015-01-01 NOTE — BH Assessment (Signed)
Assessment completed. Consulted with Assunta FoundShuvon Rankin, NP who recommends inpatient treatment at this time. Patients nurse informed of recommendation.   Davina PokeJoVea Ziggy Chanthavong, LCSW Therapeutic Triage Specialist  Health 01/01/2015 12:54 PM

## 2015-01-01 NOTE — ED Notes (Signed)
Pt recommended for inpatient treatment after [facility].

## 2015-01-01 NOTE — BH Assessment (Addendum)
Tele Assessment Note   Brad Moreno is an 34 y.o. male who presents to [facility] via [employer] under IVC from his mother for "talking to people in his head" and increased violence. Patient was in court today and was out under IVC by his mother. Patient was assessed via tele-medicine and appeared irritable and angry.  Patient responded to questions with "ask your daddy bitch?, what you think, bitch?" Or by asking the assessor the same questions. Patient  Was redirected several times by officer in the room. Patient states that he knew the assessor was asking questions from [place] and he knows that the assessor knew the answers to his questions. When asked who he lives with, patient responds; "I live in hotel rooms, bitch." When asked if he was homeless patient became upset and stated that he was not homeless and his address is located on his ID. Patient states that he is not currently suicidal or homicidal. When asked if he was suicidal now or in the past, patient asked how would he still be here if he was suicidal.  Patient would not answer questions related to AVH and appears to be responding to internal stimuli. When asked if he had questions or concerns her states "why you talking to me? Why you in my head?" Patient laughs intermittently throughout the assessment unprompted.  Patient was unable to provide information on drug and alcohol use upon assessment.  Patient BAL <5 and UDS needs to be collected at time of assessment.   Patient is alert and oriented to person, place, time, and situation. Patient was assessed via telemedicine in scrubs and his hands were handcuffed in front of him and it appeared that his ankles were cuffed to the bed.  Patient had law enforcement in the room. Patient appeared angry and irritable and spoke with pressured speech. Patient states that his appetite is good and this writer was unable to assess sleeping patterns.    Consulted with Brad FoundShuvon Rankin, NP who recommends inpatient  treatment once patient is medially cleared.   Diagnosis: Schizophrenia  Past Medical History:  Past Medical History  Diagnosis Date  . Schizophrenia (HCC)   . Depression 06/2010    History reviewed. No pertinent past surgical history.  Family History:  Family History  Problem Relation Age of Onset  . Depression Mother   . Diabetes Mother   . Cancer Paternal Grandfather [id number]    Colon Cancer  . Alcohol abuse Brother     Was Heavy drinker and smoker--has quit    Social History:  reports that he quit smoking about 12 years ago. He has never used smokeless tobacco. He reports that he does not drink alcohol or use illicit drugs.  Additional Social History:  Alcohol / Drug Use Pain Medications: See PTA Prescriptions: See PTA Over the Counter: See PTA History of alcohol / drug use?: Yes  CIWA: CIWA-Ar BP: 141/80 mmHg Pulse Rate: 97 COWS:    PATIENT STRENGTHS: (choose at least two) Average or above average intelligence  Allergies:  Allergies  Allergen Reactions  . Amoxicillin Other (See Comments)    Unknown reaction when he was young  . Pork-Derived Products Other (See Comments)    UNKNOWN    Home Medications:  (Not in a hospital admission)  OB/GYN Status:  No LMP for male patient.  General Assessment Data Location of Assessment: [facility] TTS Assessment: In system Is this a Tele or Face-to-Face Assessment?: Tele Assessment Is this an Initial Assessment or a Re-assessment for  this encounter?: Initial Assessment Marital status: Married Living Arrangements:  (Homeless) Admission Status: Involuntary Is patient capable of signing voluntary admission?: Yes Referral Source: Other     Crisis Care Plan Living Arrangements:  (Homeless) Name of Psychiatrist: UTA Name of Therapist: UTA  Education Status Is patient currently in school?: No Highest grade of school patient has completed: 12  Risk to self with the past 6 months Suicidal Ideation: No Has patient been a  risk to self within the past 6 months prior to admission? : No Suicidal Intent: No Has patient had any suicidal intent within the past 6 months prior to admission? : No Is patient at risk for suicide?: No Suicidal Plan?:  (UTA) Has patient had any suicidal plan within the past 6 months prior to admission? : No Access to Means:  (UTA) What has been your use of drugs/alcohol within the last 12 months?: UTA Previous Attempts/Gestures:  (UTA) How many times?:  (UTA) Other Self Harm Risks:  (UTA) Triggers for Past Attempts:  (UTA) Intentional Self Injurious Behavior:  (UTA) Family Suicide History:  (UTA) Recent stressful life event(s):  (UTA) Persecutory voices/beliefs?:  (UTA) Depression: No Depression Symptoms:  (UTA) Substance abuse history and/or treatment for substance abuse?:  (UTA) Suicide prevention information given to non-admitted patients:  (UTA)  Risk to Others within the past 6 months Homicidal Ideation: No Does patient have any lifetime risk of violence toward others beyond the six months prior to admission? : No Thoughts of Harm to Others: No Current Homicidal Intent: No Current Homicidal Plan: No Access to Homicidal Means: No History of harm to others?: No Assessment of Violence: None Noted Does patient have access to weapons?: No (UTA) Criminal Charges Pending?: No Does patient have a court date: No Is patient on probation?: No  Psychosis Delusions: Unspecified  Mental Status Report Appearance/Hygiene: In scrubs Eye Contact: Fair Motor Activity: Unable to assess Speech: Argumentative, Pressured Level of Consciousness: Irritable Mood: Angry Affect: Angry Anxiety Level: None Thought Processes: Irrelevant Judgement: Impaired Orientation: Person, Place, Time, Situation, Appropriate for developmental age Obsessive Compulsive Thoughts/Behaviors: None  Cognitive Functioning Concentration: Unable to Assess Memory: Unable to Assess IQ: Average Insight:  Unable to Assess Impulse Control: Unable to Assess Appetite: Good Sleep: Unable to Assess Vegetative Symptoms: Unable to Assess  ADLScreening [facility] Assessment Services) Patient's cognitive ability adequate to safely complete daily activities?: Yes Patient able to express need for assistance with ADLs?: Yes Independently performs ADLs?: Yes (appropriate for developmental age)  Prior Inpatient Therapy Prior Inpatient Therapy: Yes Prior Therapy Dates: N/A Prior Therapy Facilty/Provider(s): N/A Reason for Treatment: N/A  Prior Outpatient Therapy Prior Outpatient Therapy:  (UTA) Prior Therapy Dates:  (UTA) Prior Therapy Facilty/Provider(s):  (UTA) Reason for Treatment:  (UTA) Does patient have an ACCT team?: Unknown Does patient have Intensive In-House Services?  : Unknown Does patient have [facility] services? : Unknown Does patient have P4CC services?: Unknown  ADL Screening (condition at time of admission) Patient's cognitive ability adequate to safely complete daily activities?: Yes Is the patient deaf or have difficulty hearing?: No Does the patient have difficulty seeing, even when wearing glasses/contacts?: No Does the patient have difficulty concentrating, remembering, or making decisions?: No Patient able to express need for assistance with ADLs?: Yes Does the patient have difficulty dressing or bathing?: No Independently performs ADLs?: Yes (appropriate for developmental age) Does the patient have difficulty walking or climbing stairs?: No Weakness of Legs: None Weakness of Arms/Hands: None  Home Assistive Devices/Equipment Home Assistive Devices/Equipment: None  Therapy Consults (therapy consults require a physician order) PT Evaluation Needed: No OT Evalulation Needed: No SLP Evaluation Needed: No Abuse/Neglect Assessment (Assessment to be complete while patient is alone) Physical Abuse: Denies Verbal Abuse: Denies Sexual Abuse: Denies Exploitation of  patient/patient's resources: Denies Self-Neglect: Denies Values / Beliefs Cultural Requests During Hospitalization: None Spiritual Requests During Hospitalization: None Consults Spiritual Care Consult Needed: No Social Work Consult Needed: No Merchant navy officer (For Healthcare) Does patient have an advance directive?: No    Additional Information 1:1 In Past 12 Months?: No CIRT Risk: No Elopement Risk: No Does patient have medical clearance?: No     Disposition:  Disposition Initial Assessment Completed for this Encounter: Yes Disposition of Patient: Inpatient treatment program Type of inpatient treatment program: Adult  Alyson Ki 01/01/2015 1:22 PM

## 2015-01-01 NOTE — ED Notes (Signed)
Security Notified to [place] patient.

## 2015-01-01 NOTE — ED Notes (Signed)
Copy of all orders , labs and VS [facility] with [facility].
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
## 2015-01-01 NOTE — ED Notes (Signed)
Dr. Patria Maneampos in the room with pt for evaluation. Pt intermittently cursing and verbalizing without prompt. Officer speaking with physician.

## 2015-01-07 ENCOUNTER — Encounter: Payer: Self-pay | Admitting: Family Medicine

## 2017-01-13 ENCOUNTER — Other Ambulatory Visit: Payer: Self-pay

## 2017-01-13 ENCOUNTER — Ambulatory Visit: Payer: Medicare Other | Admitting: Physician Assistant

## 2017-01-13 ENCOUNTER — Encounter: Payer: Self-pay | Admitting: Physician Assistant

## 2017-01-13 VITALS — BP 140/82 | HR 90 | Temp 98.1°F | Resp 16 | Wt 224.4 lb

## 2017-01-13 DIAGNOSIS — B9689 Other specified bacterial agents as the cause of diseases classified elsewhere: Secondary | ICD-10-CM

## 2017-01-13 DIAGNOSIS — J988 Other specified respiratory disorders: Secondary | ICD-10-CM

## 2017-01-13 DIAGNOSIS — R6883 Chills (without fever): Secondary | ICD-10-CM | POA: Diagnosis not present

## 2017-01-13 LAB — INFLUENZA A AND B AG, IMMUNOASSAY
INFLUENZA A ANTIGEN: NOT DETECTED
INFLUENZA B ANTIGEN: NOT DETECTED

## 2017-01-13 MED ORDER — AZITHROMYCIN 250 MG PO TABS: ORAL_TABLET | ORAL | 0 refills | Status: DC

## 2017-01-13 NOTE — Progress Notes (Signed)
    Patient ID: Brad ChickJonathan E Kampe MRN: [id number], DOB: 12/22/1980, 36 y.o. Date of Encounter: 01/13/2017, 8:40 AM    Chief Complaint:  Chief Complaint  Patient presents with  . mucus  . Cough  . Emesis  . Chills     HPI: 36 y.o. year old male presents with above.   Reports that he has had cough and chest congestion for several weeks now.  That he has gone through multiple containers of over-the-counter medicines including Mucinex, another cold and cough medicine, and Robitussin.  Still sick with cough so came in.  Says that he really has not had much head congestion.  No sore throat no known fever.     Home Meds:   Outpatient Medications Prior to Visit  Medication Sig Dispense Refill  . Multiple Vitamin (MULTIVITAMIN WITH MINERALS) TABS tablet Take 1 tablet by mouth daily.    . ondansetron (ZOFRAN) 4 MG tablet Take 1 tablet (4 mg total) by mouth every 8 (eight) hours as needed for nausea or vomiting. 6 tablet 0  . tetrahydrozoline-zinc (VISINE-AC) 0.05-0.25 % ophthalmic solution Place 2 drops into both eyes 3 (three) times daily as needed.     No facility-administered medications prior to visit.     Allergies:  Allergies  Allergen Reactions  . Amoxicillin Other (See Comments)    Unknown reaction when he was young  . Pork-Derived Products Other (See Comments)    UNKNOWN      Review of Systems: See HPI for pertinent ROS. All other ROS negative.    Physical Exam: Blood pressure 140/82, pulse 90, temperature 98.1 F (36.7 C), temperature source Oral, resp. rate 16, weight 101.8 kg (224 lb 6.4 oz), SpO2 98 %., Body mass index is 30.43 kg/m. General:  WNWD AAM. Appears in no acute distress. HEENT: Normocephalic, atraumatic, eyes without discharge, sclera non-icteric, nares are without discharge. Bilateral auditory canals clear, TM's are without perforation, pearly grey and translucent with reflective cone of light bilaterally. Oral cavity moist, posterior pharynx without  exudate, erythema, peritonsillar abscess.  Neck: Supple. No thyromegaly. No lymphadenopathy. Lungs: Clear bilaterally to auscultation without wheezes, rales, or rhonchi. Breathing is unlabored. Heart: Regular rhythm. No murmurs, rubs, or gallops. Msk:  Strength and tone normal for age. Extremities/Skin: Warm and dry.  Neuro: Alert and oriented X 3. Moves all extremities spontaneously. Gait is normal. CNII-XII grossly in tact. Psych:  Responds to questions appropriately with a normal affect.     ASSESSMENT AND PLAN:  36 y.o. year old male with  1. Bacterial respiratory infection Influenza test is negative. He is to take antibiotic as directed.  Can continue over-the-counter medicines for symptom relief in the interim if needed.  Follow-up if symptoms do not resolve within 1 week after completion of antibiotic. - azithromycin (ZITHROMAX) 250 MG tablet; Day 1: Take 2 . Days 2 -5: Take 1 daily.  Dispense: 6 tablet; Refill: 0  2. Chills without fever - Influenza A and B Ag, Immunoassay   Signed, [address]Mary Beth Big DeltaDixon, [place], [facility] 01/13/2017 8:40 AM

## 2017-02-17 ENCOUNTER — Ambulatory Visit (INDEPENDENT_AMBULATORY_CARE_PROVIDER_SITE_OTHER): Payer: Medicaid Other | Admitting: Physician Assistant

## 2017-02-17 ENCOUNTER — Other Ambulatory Visit: Payer: Self-pay

## 2017-02-17 ENCOUNTER — Encounter: Payer: Self-pay | Admitting: Physician Assistant

## 2017-02-17 VITALS — BP 130/88 | HR 108 | Temp 98.1°F | Resp 16 | Ht 72.0 in | Wt 219.4 lb

## 2017-02-17 DIAGNOSIS — Z0000 Encounter for general adult medical examination without abnormal findings: Secondary | ICD-10-CM | POA: Diagnosis not present

## 2017-02-17 DIAGNOSIS — Z789 Other specified health status: Secondary | ICD-10-CM | POA: Diagnosis not present

## 2017-02-17 DIAGNOSIS — Z7289 Other problems related to lifestyle: Secondary | ICD-10-CM | POA: Insufficient documentation

## 2017-02-17 LAB — CBC WITH DIFFERENTIAL/PLATELET
BASOS PCT: 0.3 %
Basophils Absolute: 23 cells/uL (ref 0–200)
Eosinophils Absolute: 69 cells/uL (ref 15–500)
Eosinophils Relative: 0.9 %
HEMATOCRIT: 43.5 % (ref 38.5–50.0)
HEMOGLOBIN: 14.3 g/dL (ref 13.2–17.1)
LYMPHS ABS: 2572 {cells}/uL (ref 850–3900)
MCH: 26 pg — ABNORMAL LOW (ref 27.0–33.0)
MCHC: 32.9 g/dL (ref 32.0–36.0)
MCV: 79.1 fL — AB (ref 80.0–100.0)
MPV: 10.7 fL (ref 7.5–12.5)
Monocytes Relative: 5.2 %
NEUTROS PCT: 60.2 %
Neutro Abs: 4635 cells/uL (ref 1500–7800)
Platelets: 251 10*3/uL (ref 140–400)
RBC: 5.5 10*6/uL (ref 4.20–5.80)
RDW: 14.5 % (ref 11.0–15.0)
Total Lymphocyte: 33.4 %
WBC: 7.7 10*3/uL (ref 3.8–10.8)
WBCMIX: 400 {cells}/uL (ref 200–950)

## 2017-02-17 LAB — COMPLETE METABOLIC PANEL WITH GFR
AG RATIO: 1.4 (calc) (ref 1.0–2.5)
ALBUMIN MSPROF: 4.3 g/dL (ref 3.6–5.1)
ALT: 31 U/L (ref 9–46)
AST: 30 U/L (ref 10–40)
Alkaline phosphatase (APISO): 94 U/L (ref 40–115)
BUN: 15 mg/dL (ref 7–25)
CALCIUM: 9.2 mg/dL (ref 8.6–10.3)
CO2: 27 mmol/L (ref 20–32)
Chloride: 104 mmol/L (ref 98–110)
Creat: 1.16 mg/dL (ref 0.60–1.35)
GFR, EST AFRICAN AMERICAN: 95 mL/min/{1.73_m2} (ref >=60)
GFR, EST NON AFRICAN AMERICAN: 82 mL/min/{1.73_m2} (ref >=60)
GLOBULIN: 3 g/dL (ref 1.9–3.7)
Glucose, Bld: 96 mg/dL (ref 65–99)
POTASSIUM: 4.7 mmol/L (ref 3.5–5.3)
SODIUM: 141 mmol/L (ref 135–146)
TOTAL PROTEIN: 7.3 g/dL (ref 6.1–8.1)
Total Bilirubin: 0.4 mg/dL (ref 0.2–1.2)

## 2017-02-17 LAB — LIPID PANEL
CHOL/HDL RATIO: 2.7 (calc) (ref <5.0)
Cholesterol: 176 mg/dL (ref <200)
HDL: 66 mg/dL (ref >40)
LDL Cholesterol (Calc): 85 mg/dL (calc)
NON-HDL CHOLESTEROL (CALC): 110 mg/dL (ref <130)
Triglycerides: 153 mg/dL — ABNORMAL HIGH (ref <150)

## 2017-02-17 NOTE — Progress Notes (Signed)
Patient ID: Brad Moreno MRN: [id number], DOB: 09/22/1980 37 y.o. Date of Encounter: 02/17/2017, 8:25 AM    Chief Complaint: Physical (CPE)  HPI: 37 y.o. y/o male here for CPE.   He has no significant concerns to address today.  He reports that he has no past medical history, no known chronic medical problems Reports that he has had no surgeries  Reviewed his prior social history and then reviewed current social history with him today. In the past had documented that he was on disability. Today he reports the following: He has 2 girls ages 276 and 3012 and 1 boy age 35.  He lives with his kids and his mom and his dad. He states that he is now working a job.  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  Started that job in April 2018.  Started working there as a temp.  Prior to working there was working at [employer].  Continue to work at [employer] for extra money. Reviewed that in the past he was on disability and was not working.  He states that in 2016 he was informed that he no longer needed to be on disability.  [facility]  States that his last visit they are was in July 2018 with Dr. Rosalia Hammersay.  I asked if he was on medications.  Says that he was told "if you feel like you need to get back on medicines we will keep you her chart open and you can come back here and we will prescribe medication for you if you need it.  He reports that he feels like he is totally stable off of medicines.  That he is able to work his job without any problem. Reviewed that he was going to the gym in the past.  He states that he is not going to the gym now. Reports that he does not smoke any cigarettes. Does drink alcohol.  None during the week.  On the weekend will drink about 8 on Saturday and about 6 on Sunday. Discussed that he needs to try to limit this if possible. States that this is his "vacation "--- that he will have no days  off for 4 months and that this is his way to have a little vacation he drinks that, watches football or plays video game with his son. Discussed other alternatives to "escape reality temporarily and have a break---  playing basketball with his son, watching a movie, there are things that would get his mind off of work etc. Then adds that "I am also going through divorce and dealing with that"  Review of Systems: Consitutional: No fever, chills, fatigue, night sweats, lymphadenopathy, or weight changes. Eyes: No visual changes, eye redness, or discharge. ENT/Mouth: Ears: No otalgia, tinnitus, hearing loss, discharge. Nose: No congestion, rhinorrhea, sinus pain, or epistaxis. Throat: No sore throat, post nasal drip, or teeth pain. Cardiovascular: No CP, palpitations, diaphoresis, DOE, edema, orthopnea, PND. Respiratory: No cough, hemoptysis, SOB, or wheezing. Gastrointestinal: No anorexia, dysphagia, reflux, pain, nausea, vomiting, hematemesis, diarrhea, constipation, BRBPR, or melena. Genitourinary: No dysuria, frequency, urgency, hematuria, incontinence, nocturia, decreased urinary stream, discharge, impotence, or testicular pain/masses. Musculoskeletal: No decreased ROM, myalgias, stiffness, joint swelling, or weakness. Skin: No rash, erythema, lesion changes, pain, warmth, jaundice, or pruritis. Neurological: No headache, dizziness, syncope, seizures, tremors, memory loss, coordination problems, or paresthesias. Psychological: No anxiety, depression, hallucinations,  SI/HI. Endocrine: No fatigue, polydipsia, polyphagia, polyuria, or known diabetes. All other systems were reviewed and are otherwise negative.  Past Medical History:  Diagnosis Date  . Depression 06/2010  . Schizophrenia (HCC)      No past surgical history on file.  Home Meds:  Outpatient Medications Prior to Visit  Medication Sig Dispense Refill  . Multiple Vitamin (MULTIVITAMIN WITH MINERALS) TABS tablet Take 1 tablet by  mouth daily.    Marland Kitchen azithromycin (ZITHROMAX) 250 MG tablet Day 1: Take 2 . Days 2 -5: Take 1 daily. 6 tablet 0   No facility-administered medications prior to visit.     Allergies:  Allergies  Allergen Reactions  . Amoxicillin Other (See Comments)    Unknown reaction when he was young  . Pork-Derived Products Other (See Comments)    UNKNOWN    Social History is documented above--in HPI section   Family History  Problem Relation Age of Onset  . Depression Mother   . Diabetes Mother   . Cancer Paternal Grandfather 61       Colon Cancer  . Alcohol abuse Brother        Was Heavy drinker and smoker--has quit    Physical Exam: Blood pressure 130/88, pulse (!) 108, temperature 98.1 F (36.7 C), temperature source Oral, resp. rate 16, height 6' (1.829 m), weight 99.5 kg (219 lb 6.4 oz), SpO2 98 %.  General: Well developed, well nourished AAM. Appears in no acute distress. HEENT: Normocephalic, atraumatic. Conjunctiva pink, sclera non-icteric. Pupils 2 mm constricting to 1 mm, round, regular, and equally reactive to light and accomodation. EOMI. Internal auditory canal clear. TMs with good cone of light and without pathology. Nasal mucosa pink. Nares are without discharge. No sinus tenderness. Oral mucosa pink.  Neck: Supple. Trachea midline. No thyromegaly. Full ROM. No lymphadenopathy. Lungs: Clear to auscultation bilaterally without wheezes, rales, or rhonchi. Breathing is of normal effort and unlabored. Cardiovascular: RRR with S1 S2. No murmurs, rubs, or gallops. Distal pulses 2+ symmetrically. No carotid or abdominal bruits. Abdomen: Soft, non-tender, non-distended with normoactive bowel sounds. No hepatosplenomegaly or masses. No rebound/guarding. No CVA tenderness. No hernias. Musculoskeletal: Full range of motion and 5/5 strength throughout.  Skin: Warm and moist without erythema, ecchymosis, wounds, or rash. Neuro: A+Ox3. CN II-XII grossly intact. Moves all extremities  spontaneously. Full sensation throughout. Normal gait.  Psych:  Responds to questions appropriately with a normal affect.   Assessment/Plan:  37 y.o. y/o  male here for CPE  1. Encounter for preventive health examination  A. Screening Labs: Reports that he is fasting.  Check screening labs now. - CBC with Differential/Platelet - COMPLETE METABOLIC PANEL WITH GFR - Lipid panel  B. Screening For Prostate Cancer: No indication to require this until age 56.  An African-American, is due at age 80 -68.  C. Screening For Colorectal Cancer:  No indictation to require this until age 20  D. Immunizations: Flu--------- reports that he did get flu vaccine for this season at [facility]. Tetanus-----he received Tdap 08/01/2013 Pneumococcal------ no indication to require this until age 36 Shingrix-------Not indicated until age 64  2. Alcohol use Does drink alcohol.  None during the week.  On the weekend will drink about 8 on Saturday and about 6 on Sunday. Discussed that he needs to try to limit this if possible. States that this is his "vacation "--- that he will have no days off for 4 months and that this is his way to have a little vacation he  drinks that, watches football or plays video game with his son. Discussed other alternatives to "escape reality temporarily and have a break---  playing basketball with his son, watching a movie, there are things that would get his mind off of work etc. Then adds that "I am also going through divorce and dealing with that" Also discussed to be very aware and mindful of the amount of alcohol he is consuming.  Try to limit this as much as possible and make sure that it does not increase. If this does not improve, may need follow-up with psychiatry--- may either require medication or at least therapy for coping skills.   Signed:   [address] [place], [place]  02/17/2017 8:25 AM

## 2017-03-02 ENCOUNTER — Encounter (HOSPITAL_COMMUNITY): Payer: Self-pay

## 2017-03-02 ENCOUNTER — Emergency Department (HOSPITAL_COMMUNITY)
Admission: EM | Admit: 2017-03-02 | Discharge: 2017-03-03 | Disposition: A | Discharge status: Home / Self Care | Payer: Medicaid Other | Attending: Emergency Medicine | Admitting: Emergency Medicine

## 2017-03-02 DIAGNOSIS — Z79899 Other long term (current) drug therapy: Secondary | ICD-10-CM | POA: Insufficient documentation

## 2017-03-02 DIAGNOSIS — F23 Brief psychotic disorder: Secondary | ICD-10-CM

## 2017-03-02 DIAGNOSIS — F17200 Nicotine dependence, unspecified, uncomplicated: Secondary | ICD-10-CM | POA: Insufficient documentation

## 2017-03-02 DIAGNOSIS — Z046 Encounter for general psychiatric examination, requested by authority: Secondary | ICD-10-CM | POA: Insufficient documentation

## 2017-03-02 DIAGNOSIS — F22 Delusional disorders: Secondary | ICD-10-CM | POA: Insufficient documentation

## 2017-03-02 DIAGNOSIS — F209 Schizophrenia, unspecified: Secondary | ICD-10-CM | POA: Insufficient documentation

## 2017-03-02 DIAGNOSIS — F312 Bipolar disorder, current episode manic severe with psychotic features: Secondary | ICD-10-CM | POA: Diagnosis not present

## 2017-03-02 HISTORY — DX: Bipolar disorder, unspecified: F31.9

## 2017-03-02 NOTE — ED Triage Notes (Signed)
Pt was brought in by [employer] with IVC papers taken out by his family due to pt not taking his meds for bipolar.   Pt states he does not know why he is here and denies complaints or any issues.  Pt is agitated, gets upset over routine questions asked of him.

## 2017-03-02 NOTE — ED Provider Notes (Signed)
[facility]GENCY [facility]PARTMENT Provider Note   CSN: [id number] Arrival date & time: 03/02/17  2321     History   Chief Complaint Chief Complaint  Patient presents with  . V70.1    HPI Brad Moreno is a 37 y.o. male.  Patient is a 37 year old male with past medical history of bipolar disorder, depression, and schizophrenia.  He was brought by authorities for evaluation of erratic behavior, paranoia.  And involuntary commitment was initiated by the patient's wife stating that he has been off of his medications and is hallucinating, stating that people are following him.  He apparently picked up his daughter from school this afternoon, then would not respond to the wife's phone calls.  She became concerned that he was driving in his current state of mental health and was concerned about the safety of her daughter.  The patient is unable to give me a coherent story.  His answers to my questions are indirect and inappropriate.  He becomes emotionally labile and fluctuates between yelling and crying.   The history is provided by the patient.    Past Medical History:  Diagnosis Date  . Bipolar 1 disorder (HCC)   . Depression 06/2010  . Schizophrenia [facility](HCC)     Patient Active Problem List   Diagnosis Date Noted  . Alcohol use 02/17/2017  . Depression 06/09/2010    History reviewed. No pertinent surgical history.     Home Medications    Prior to Admission medications   Medication Sig Start Date End Date Taking? Authorizing Provider  Multiple Vitamin (MULTIVITAMIN WITH MINERALS) TABS tablet Take 1 tablet by mouth daily.    [provider]    Family History Family History  Problem Relation Age of Onset  . Depression Mother   . Diabetes Mother   . Cancer Paternal Grandfather 465       Colon Cancer  . Alcohol abuse Brother        Was Heavy drinker and smoker--has quit    Social History Social History   Tobacco Use  . Smoking status: Current Every  Day Smoker    Last attempt to quit: 02/11/2002    Years since quitting: 15.0  . Smokeless tobacco: Never Used  Substance Use Topics  . Alcohol use: Yes    Alcohol/week: 1.8 oz    Types: 3 Cans of beer per week  . Drug use: Yes    Types: Marijuana     Allergies   Amoxicillin and Pork-derived products   Review of Systems Review of Systems  All other systems reviewed and are negative.    Physical Exam Updated Vital Signs BP (!) 147/97 (BP Location: Right Arm)   Pulse (!) 114   Temp 98.2 F (36.8 C) (Oral)   Resp 20   Ht 5\' 11"  (1.803 m)   Wt 111.1 kg (245 lb)   SpO2 100%   BMI 34.17 kg/m   Physical Exam  Constitutional: He is oriented to person, place, and time. He appears well-developed and well-nourished. No distress.  HENT:  Head: Normocephalic and atraumatic.  Mouth/Throat: Oropharynx is clear and moist.  Neck: Normal range of motion. Neck supple.  Cardiovascular: Normal rate and regular rhythm. Exam reveals no friction rub.  No murmur heard. Pulmonary/Chest: Effort normal and breath sounds normal. No respiratory distress. He has no wheezes. He has no rales.  Abdominal: Soft. Bowel sounds are normal. He exhibits no distension. There is no tenderness.  Musculoskeletal: Normal range of motion. He exhibits  no edema.  Neurological: He is alert and oriented to person, place, and time. Coordination normal.  Skin: Skin is warm and dry. He is not diaphoretic.  Psychiatric: His affect is blunt and labile. His speech is rapid and/or pressured and tangential. He is agitated and aggressive. Thought content is paranoid. He expresses impulsivity.  Nursing note and vitals reviewed.    ED Treatments / Results  Labs (all labs ordered are listed, but only abnormal results are displayed) Labs Reviewed  BASIC METABOLIC PANEL  CBC WITH DIFFERENTIAL/PLATELET  ETHANOL  URINALYSIS, ROUTINE W REFLEX MICROSCOPIC  RAPID URINE DRUG SCREEN, HOSP PERFORMED    EKG  EKG  Interpretation None       Radiology No results found.  Procedures Procedures (including critical care time)  Medications Ordered in ED Medications - No data to display   Initial Impression / Assessment and Plan / ED Course  I have reviewed the triage vital signs and the nursing notes.  Pertinent labs & imaging results that were available during my care of the patient were reviewed by me and considered in my medical decision making (see chart for details).  Patient brought here under IVC for the above reasons.  He does appear manic with tangential speech and flight of ideas with.  He will be evaluated by TTS who will determine the final disposition.  Final Clinical Impressions(s) / ED Diagnoses   Final diagnoses:  None    ED Discharge Orders    None       Geoffery Lyons, MD 03/03/17 [phone]

## 2017-03-03 LAB — RAPID URINE DRUG SCREEN, HOSP PERFORMED
AMPHETAMINES: NOT DETECTED
Barbiturates: NOT DETECTED
Benzodiazepines: NOT DETECTED
Cocaine: NOT DETECTED
Opiates: NOT DETECTED
TETRAHYDROCANNABINOL: NOT DETECTED

## 2017-03-03 LAB — CBC WITH DIFFERENTIAL/PLATELET
BASOS PCT: 0 %
Basophils Absolute: 0 10*3/uL (ref 0.0–0.1)
EOS ABS: 0 10*3/uL (ref 0.0–0.7)
EOS PCT: 0 %
HCT: 44.6 % (ref 39.0–52.0)
HEMOGLOBIN: 14.3 g/dL (ref 13.0–17.0)
LYMPHS ABS: 1.7 10*3/uL (ref 0.7–4.0)
Lymphocytes Relative: 19 %
MCH: 26 pg (ref 26.0–34.0)
MCHC: 32.1 g/dL (ref 30.0–36.0)
MCV: 81.2 fL (ref 78.0–100.0)
MONO ABS: 0.6 10*3/uL (ref 0.1–1.0)
MONOS PCT: 7 %
NEUTROS PCT: 74 %
Neutro Abs: 6.8 10*3/uL (ref 1.7–7.7)
Platelets: 214 10*3/uL (ref 150–400)
RBC: 5.49 MIL/uL (ref 4.22–5.81)
RDW: 14.8 % (ref 11.5–15.5)
WBC: 9.2 10*3/uL (ref 4.0–10.5)

## 2017-03-03 LAB — BASIC METABOLIC PANEL
Anion gap: 11 (ref 5–15)
BUN: 11 mg/dL (ref 6–20)
CALCIUM: 9.4 mg/dL (ref 8.9–10.3)
CHLORIDE: 105 mmol/L (ref 101–111)
CO2: 22 mmol/L (ref 22–32)
CREATININE: 1.02 mg/dL (ref 0.61–1.24)
GFR calc Af Amer: >60 mL/min (ref >60)
GFR calc non Af Amer: >60 mL/min (ref >60)
GLUCOSE: 107 mg/dL — AB (ref 65–99)
Potassium: 3.8 mmol/L (ref 3.5–5.1)
Sodium: 138 mmol/L (ref 135–145)

## 2017-03-03 LAB — URINALYSIS, ROUTINE W REFLEX MICROSCOPIC
BILIRUBIN URINE: NEGATIVE
GLUCOSE, UA: NEGATIVE mg/dL
Hgb urine dipstick: NEGATIVE
Ketones, ur: 20 mg/dL — AB
Leukocytes, UA: NEGATIVE
Nitrite: NEGATIVE
PROTEIN: NEGATIVE mg/dL
Specific Gravity, Urine: 1.02 (ref 1.005–1.030)
pH: 5 (ref 5.0–8.0)

## 2017-03-03 LAB — ETHANOL

## 2017-03-03 MED ORDER — LORAZEPAM 1 MG PO TABS
1.0000 mg | ORAL_TABLET | Freq: Once | ORAL | Status: DC
  Filled 2017-03-03: qty 1

## 2017-03-03 NOTE — BH Assessment (Addendum)
Tele Assessment Note   Patient Name: Brad Moreno MRN: [id number] Referring Physician: Dr Brad Moreno Location of [facility]: [facility]  Location of Provider: Behavioral Health TTS Department  Brad Moreno is an 37 y.o. male who [facility] to [facility] by [facility] reporting symptoms of psychosis. Pt has a history of bipolar, depression and schizophrenia and says he was referred for assessment by [facility]. Pt denies currently taking medication.  Pt denies current suicidal ideation. Pt was tearful, tangential. This counselor was unable to assess the pt because he was unable to stay on subject.  Pt presents with thought blocking, flight of ideas. Pt acknowledges symptoms including: sadness and tearfulness.  Pt denies homicidal ideation/ history of violence. Pt denies auditory or visual hallucinations or other psychotic symptoms. Pt denies current stressors.   Pt gave permission to call his wife Brad Moreno at [phone]. Pt's wife shared that she learned today from the pt's mom that he put a dents in his dad's car.  Wife shared the pt picked their daughter up from school early today without her knowing and appeared angry at the school.  She was unable to locate pt and their daughter for a few hours and contacted the police.  When wife arrived to pt's parents house pt was talking to himself and answering himself, writing down license plate numbers.  Wife learned that after she left pt had placed car parts in the driveway to block entering and exiting and poured trash all over the yard.  Wife shared she IVC'd the pt because his mother didn't want to.  Wife stated pt has been going to [facility] in [place] for mental health and he has been off of his medications for a few months.  Wife also shared pt has had IP before at [facility].  Pt lives with his mother, and supports include his wife. Unable to assess pt's history of abuse and trauma. Unable to assess pt's family history.Pt has  poorinsight and impartial judgment. Pt's memory is impaired.  Unable to assess pt's legal history.  Unable to assess pt's alcohol/substance abuse.   Pt is dressed in scrubs, alert, oriented x3 with tangential speech and restless, agitated, and shuffling motor behavior. Eye contact is poor. Pt's mood is apphrensive and preoccupied and affect is apprehensive and preoccupied. Affect is congruent with mood. Thought process is tangential, flight of ideas and thought blocking. There is an indication Pt is currently responding to internal stimuli or experiencing delusional thought content. Pt was non- cooperative throughout assessment. Pt is currently unable to contract for safety outside the hospital.    Diagnosis: F31.2 Bipolar I disorder, Current or most recent episode manic, With psychotic features  Past Medical History:  Past Medical History:  Diagnosis Date  . Bipolar 1 disorder (HCC)   . Depression 06/2010  . Schizophrenia (HCC)     History reviewed. No pertinent surgical history.  Family History:  Family History  Problem Relation Age of Onset  . Depression Mother   . Diabetes Mother   . Cancer Paternal Grandfather 30       Colon Cancer  . Alcohol abuse Brother        Was Heavy drinker and smoker--has quit    Social History:  reports that he has been smoking.  he has never used smokeless tobacco. He reports that he drinks about 1.8 oz of alcohol per week. He reports that he uses drugs. Drug: Marijuana.  Additional Social History:  Alcohol / Drug Use Pain Medications: See [facility]  Prescriptions: See MAR Over the Counter: See MAR History of alcohol / drug use?: No history of alcohol / drug abuse(UTA)  CIWA: CIWA-Ar BP: (!) 147/97 Pulse Rate: (!) 114 COWS:    Allergies:  Allergies  Allergen Reactions  . Amoxicillin Other (See Comments)    Unknown reaction when he was young  . Pork-Derived Products Other (See Comments)    UNKNOWN    Home Medications:  (Not in a hospital  admission)  OB/GYN Status:  No LMP for male patient.  General Assessment Data [facility] of Assessment: [facility] TTS Assessment: In system Is this a Tele or Face-to-Face Assessment?: Tele Assessment Is this an Initial Assessment or a Re-assessment for this encounter?: Initial Assessment Marital status: Single Maiden name: N/A Is patient pregnant?: No Pregnancy Status: No Living Arrangements: Parent Can pt return to current living arrangement?: Yes Admission Status: Involuntary Is patient capable of signing voluntary admission?: No(Pt has been IVC'd by [facility]) Referral Source: Other(Pt was brought in by [facility]) Insurance type: None     Crisis Care Plan Living Arrangements: Parent  Education Status Is patient currently in school?: No Highest grade of school patient has completed: UTA  Risk to self with the past 6 months Suicidal Ideation: No Has patient been a risk to self within the past 6 months prior to admission? : Other (comment)(UTA) Suicidal Intent: No Has patient had any suicidal intent within the past 6 months prior to admission? : Other (comment)(UTA) Is patient at risk for suicide?: No Suicidal Plan?: No Has patient had any suicidal plan within the past 6 months prior to admission? : Other (comment)(UTA) Access to Means: No What has been your use of drugs/alcohol within the last 12 months?: UTA Previous Attempts/Gestures: No(UTA) How many times?: 0 Other Self Harm Risks: UTA Triggers for Past Attempts: Other (Comment)(UTA) Intentional Self Injurious Behavior: None(UTA) Family Suicide History: Unable to assess Recent stressful life event(s): Other (Comment)(UTA) Persecutory voices/beliefs?: Yes(Per pt's wife pt was talking to and answering himself) Depression: No Depression Symptoms: Tearfulness Substance abuse history and/or treatment for substance abuse?: No(UTA) Suicide prevention information given to non-admitted patients: Not applicable  Risk to Others within  the past 6 months Homicidal Ideation: No Does patient have any lifetime risk of violence toward others beyond the six months prior to admission? : No Thoughts of Harm to Others: No Current Homicidal Intent: No Current Homicidal Plan: No Access to Homicidal Means: No Identified Victim: Pt denies History of harm to others?: No Assessment of Violence: None Noted Violent Behavior Description: Pt denies Does patient have access to weapons?: No Criminal Charges Pending?: No Does patient have a court date: No Is patient on probation?: No  Psychosis Hallucinations: Auditory(Per pt's wife pt was talking to and answering himself) Delusions: None noted(UTA)  Mental Status Report Appearance/Hygiene: In scrubs Eye Contact: Poor Motor Activity: Agitation, Freedom of movement, Restlessness, Shuffling Speech: Tangential Level of Consciousness: Alert Mood: Anxious, Apprehensive, Depressed, Sad, Preoccupied Affect: Anxious, Apprehensive, Depressed, Irritable, Preoccupied, Sad Anxiety Level: Minimal Thought Processes: Tangential, Flight of Ideas, Thought Blocking Judgement: Impaired Orientation: Person, Place, Time Obsessive Compulsive Thoughts/Behaviors: None  Cognitive Functioning Concentration: Poor Memory: Recent Impaired, Remote Impaired IQ: Average Insight: Poor Impulse Control: Poor Appetite: Good Weight Loss: 0 Weight Gain: 0 Sleep: Decreased Total Hours of Sleep: 4 Vegetative Symptoms: Unable to Assess  ADLScreening [facility] Assessment Services) Patient's cognitive ability adequate to safely complete daily activities?: No(UTA) Patient able to express need for assistance with ADLs?: No(UTA) Independently performs ADLs?: Yes (appropriate  for developmental age)(UTA)  Prior Inpatient Therapy Prior Inpatient Therapy: Yes Prior Therapy Facilty/Provider(s): [facility] Reason for Treatment: Bipolar  Prior Outpatient Therapy Prior Outpatient Therapy: Yes Prior Therapy  Fac[facility]Provider[facility] [place] Reason for Treatment: Bipolar Does patient have an ACCT team?: Unknown Does patient have Intensive In-House Services?  : Unknown Does patient have [facility] services? : Unknown Does patient have P4CC services?: Unknown  ADL Screening (condition at time of admission) Patient's cognitive ability adequate to safely complete daily activities?: No(UTA) Is the patient deaf or have difficulty hearing?: (UTA) Does the patient have difficulty seeing, even when wearing glasses/contacts?: (UTA) Does the patient have difficulty concentrating, remembering, or making decisions?: (UTA) Patient able to express need for assistance with ADLs?: No(UTA) Does the patient have difficulty dressing or bathing?: (UTA) Independently performs ADLs?: Yes (appropriate for developmental age)(UTA) Does the patient have difficulty walking or climbing stairs?: No Weakness of Legs: None Weakness of Arms/Hands: None       Abuse/Neglect Assessment (Assessment to be complete while patient is alone) Abuse/Neglect Assessment Can Be Completed: Unable to assess, patient is non-responsive or altered mental status     Advance Directives (For Healthcare) Does Patient Have a Medical Advance Directive?: (UTA)    Additional Information 1:1 In Past 12 Months?: No CIRT Risk: Yes Elopement Risk: Yes Does patient have medical clearance?: Yes     Disposition: Gave clinical report to Donell Sievert, PA who stated Pt meets criteria for impatient psychiatric treatment.  Fransico Michael, RN and [facility] at [facility] stated there are no appropriate beds for the Pt.  SW to seek placement in the morning.  Notified Micheal, RN and Dr Brad Moreno of recommendation.  Disposition Initial Assessment Completed for this Encounter: Yes Disposition of Patient: Inpatient treatment program Type of inpatient treatment program: Adult  This service was provided via telemedicine using a 2-way, interactive audio and  video technology.  Names of all persons participating in this telemedicine service and their role in this encounter. Name: Fernande Boyden Role: Patient  Name: Annamaria Boots, MS, [facility] Role: TTS Counsleor  Name:  Role:   Name:  Role:    Annamaria Boots, MS, [facility] Therapeutic Triage Specialist  Annamaria Boots 03/03/2017 2:59 AM

## 2017-03-03 NOTE — ED Notes (Signed)
Pt given breakfast tray

## 2017-03-03 NOTE — BHH Counselor (Signed)
Pt was reassessed this AM.  He was groggy, staring, monosyllabic.  When asked why he was at the hospital, Pt stated, "Because I'm crazy."  Pt stated that he hears and sees things others cannot, but he would not elaborate.  When offered treatment, Pt responded "It's too late."  Recommend continued inpatient.  From assessment:  Lillette BoxerJonathan E XXXJohnson is an 37 y.o. male who [facility] reporting symptoms of psychosis. Pt has a history of bipolar, depression and schizophrenia and says he was referred for assessment by [facility]. Pt denies currently taking medication.  Pt denies current suicidal ideation. Pt was tearful, tangential. This counselor was unable to assess the pt because he was unable to stay on subject.  Pt presents with thought blocking, flight of ideas. Pt acknowledges symptoms including: sadness and tearfulness.  Pt denies homicidal ideation/ history of violence. Pt denies auditory or visual hallucinations or other psychotic symptoms. Pt denies current stressors.

## 2017-03-03 NOTE — ED Notes (Addendum)
Spoke with patient this morning. He could not follow conversation as he responded to obvious internal stemuli. I asked him if he was seeing anything and he responded with a laugh and said no. He became distracted again. I had to get his attention again.  Patient is unable to follow a conversation more than a few seconds before becoming distracted.

## 2017-03-03 NOTE — Progress Notes (Signed)
Pt chart reviewed. Pt meets criteria for inpatient hospitalization.  Pt referrals sent and are UNDER REVIEW at the following hospitals:  [facility]     [facility]  [facility]  [facility]  [facility]  [facility]  [facility]  [facility]        Disposition CSW will continue to follow for placement.  Brad EulerJean T. Kaylyn LimSutter, MSW, LCSWA Disposition Clinical Social Work [phone] (cell) [phone] (office)

## 2017-03-03 NOTE — Progress Notes (Addendum)
[facility] Dr.Kohl is the attending provider.  Call report to [phone] Judie GrieveBryan @[facility]  notified Pt is IVC   Pt may be transported by [employer] Pt scheduled  [facility] as [employer] [facility] can transport.  Timmothy EulerJean T. Kaylyn LimSutter, MSW, LCSWA Disposition Clinical Social Work [phone] (cell) [phone] (office) .

## 2017-03-22 ENCOUNTER — Emergency Department (HOSPITAL_COMMUNITY)
Admission: EM | Admit: 2017-03-22 | Discharge: 2017-03-23 | Disposition: A | Discharge status: Other Acute Inpatient Hospital | Payer: Medicaid Other | Attending: Emergency Medicine | Admitting: Emergency Medicine

## 2017-03-22 ENCOUNTER — Encounter (HOSPITAL_COMMUNITY): Payer: Self-pay | Admitting: *Deleted

## 2017-03-22 ENCOUNTER — Other Ambulatory Visit: Payer: Self-pay

## 2017-03-22 DIAGNOSIS — F319 Bipolar disorder, unspecified: Secondary | ICD-10-CM | POA: Diagnosis not present

## 2017-03-22 DIAGNOSIS — F209 Schizophrenia, unspecified: Secondary | ICD-10-CM | POA: Insufficient documentation

## 2017-03-22 DIAGNOSIS — Z79899 Other long term (current) drug therapy: Secondary | ICD-10-CM | POA: Diagnosis not present

## 2017-03-22 DIAGNOSIS — F17200 Nicotine dependence, unspecified, uncomplicated: Secondary | ICD-10-CM | POA: Insufficient documentation

## 2017-03-22 LAB — CBC WITH DIFFERENTIAL/PLATELET
Basophils Absolute: 0 10*3/uL (ref 0.0–0.1)
Basophils Relative: 0 %
EOS ABS: 0.1 10*3/uL (ref 0.0–0.7)
EOS PCT: 1 %
HCT: 42.9 % (ref 39.0–52.0)
Hemoglobin: 13.7 g/dL (ref 13.0–17.0)
LYMPHS ABS: 2 10*3/uL (ref 0.7–4.0)
LYMPHS PCT: 23 %
MCH: 26.5 pg (ref 26.0–34.0)
MCHC: 31.9 g/dL (ref 30.0–36.0)
MCV: 83 fL (ref 78.0–100.0)
MONO ABS: 0.4 10*3/uL (ref 0.1–1.0)
Monocytes Relative: 4 %
Neutro Abs: 6.4 10*3/uL (ref 1.7–7.7)
Neutrophils Relative %: 72 %
PLATELETS: 273 10*3/uL (ref 150–400)
RBC: 5.17 MIL/uL (ref 4.22–5.81)
RDW: 15.1 % (ref 11.5–15.5)
WBC: 8.9 10*3/uL (ref 4.0–10.5)

## 2017-03-22 LAB — BASIC METABOLIC PANEL
Anion gap: 9 (ref 5–15)
BUN: 9 mg/dL (ref 6–20)
CALCIUM: 9.1 mg/dL (ref 8.9–10.3)
CO2: 24 mmol/L (ref 22–32)
CREATININE: 0.86 mg/dL (ref 0.61–1.24)
Chloride: 103 mmol/L (ref 101–111)
GFR calc Af Amer: >60 mL/min (ref >60)
GLUCOSE: 117 mg/dL — AB (ref 65–99)
Potassium: 3.9 mmol/L (ref 3.5–5.1)
SODIUM: 136 mmol/L (ref 135–145)

## 2017-03-22 LAB — RAPID URINE DRUG SCREEN, HOSP PERFORMED
Amphetamines: NOT DETECTED
Barbiturates: NOT DETECTED
Benzodiazepines: NOT DETECTED
Cocaine: NOT DETECTED
OPIATES: NOT DETECTED
Tetrahydrocannabinol: NOT DETECTED

## 2017-03-22 LAB — ETHANOL

## 2017-03-22 MED ORDER — HALOPERIDOL LACTATE 5 MG/ML IJ SOLN
10.0000 mg | Freq: Once | INTRAMUSCULAR | Status: AC
  Administered 2017-03-22: 10 mg via INTRAMUSCULAR

## 2017-03-22 MED ORDER — HALOPERIDOL LACTATE 5 MG/ML IJ SOLN
INTRAMUSCULAR | Status: AC
  Filled 2017-03-22: qty 1

## 2017-03-22 MED ORDER — HALOPERIDOL LACTATE 5 MG/ML IJ SOLN
5.0000 mg | Freq: Once | INTRAMUSCULAR | Status: DC
  Filled 2017-03-22: qty 1

## 2017-03-22 MED ORDER — STERILE WATER FOR INJECTION IJ SOLN
INTRAMUSCULAR | Status: AC
  Administered 2017-03-22: 15:00:00
  Filled 2017-03-22: qty 10

## 2017-03-22 NOTE — ED Notes (Signed)
Pt calm and cooperative at this time.

## 2017-03-22 NOTE — BH Assessment (Addendum)
Tele Assessment Note   Patient Name: Brad Moreno MRN: [id number] Referring Physician: EDP Adriana Simas  Location of [facility]: [facility] Location of Provider: Behavioral Health TTS Department  UZAIR GODLEY is an 37 y.o. male. Pt [facility] to [facility] brought in by his father. He is somewhat cooperative. He is oriented to self, place and date only. His affect is anxious and labile. Pt is tangential. He is sometimes able to be redirected. Pt irritable. Pt says he is no longer taking Lithium prescribed by Delice Bison b/c "it makes me feel like I'm having a brain aneurysm." Pt says that he is tired of being under involuntary commitment. Pt says he would like to "see what it feels like to be voluntary." Pt says that he [facility] to [facility]. Writer explains [facility] is at capacity. Pt then says he will walk home and writer can't stop him. Pt says, "Y'all been recording my life?" He goes on to say he is a Oncologist celebrity". Pt contradicts statements made earlier during assessment. When asked re: [facility], pt replies, "Everyone has hallucinations". Writer says that not everyone has [facility]. Pt then says, "Well I don't have them." Pt says he and his wife are separated. Pt reports "perfect" mood.   Collateral info provided by father who is in waiting room - Brad Moreno [phone]. Dad says pt hasn't slept in the past couple of nights. Dad reports pt was "up all night" pacing. He says pt isn't taking his psych meds. He says pt believes the [facility] is trying to kill him. Dad says pt trashed the house this am. He says pt talks to people who aren't actually in the house.   Diagnosis: Other specified Schizophrenia spectrum or other psychotic Disorder  Past Medical History:  Past Medical History:  Diagnosis Date  . Bipolar 1 disorder (HCC)   . Depression 06/2010  . Schizophrenia (HCC)     History reviewed. No pertinent surgical history.  Family History:  Family History  Problem  Relation Age of Onset  . Depression Mother   . Diabetes Mother   . Cancer Paternal Grandfather 55       Colon Cancer  . Alcohol abuse Brother        Was Heavy drinker and smoker--has quit    Social History:  reports that he has been smoking.  he has never used smokeless tobacco. He reports that he drinks about 1.8 oz of alcohol per week. He reports that he uses drugs. Drug: Marijuana.  Additional Social History:  Alcohol / Drug Use Pain Medications: pt denies abuse - see pta meds list Prescriptions: pt denies abuse - see pta meds list Over the Counter: pt denies abuse - see pta meds list History of alcohol / drug use?: No history of alcohol / drug abuse  CIWA: CIWA-Ar BP: (!) 164/107 Pulse Rate: 98 COWS:    Allergies:  Allergies  Allergen Reactions  . Amoxicillin Other (See Comments)    Unknown reaction when he was young  . Pork-Derived Products Other (See Comments)    UNKNOWN    Home Medications:  (Not in a hospital admission)  OB/GYN Status:  No LMP for male patient.  General Assessment Data Location of Assessment: [facility] TTS Assessment: In system Is this a Tele or Face-to-Face Assessment?: Tele Assessment Is this an Initial Assessment or a Re-assessment for this encounter?: Initial Assessment Marital status: Separated Brad Moreno name: Brad Moreno Is patient pregnant?: No Pregnancy Status: No Living Arrangements:  Parent, Children(mom, dad, 356 yr old daughter, 35 yo son) Can pt return to current living arrangement?: Yes Admission Status: Involuntary(per notes, pt being placed under IVC) Is patient capable of signing voluntary admission?: No Referral Source: Self/Family/Friend Insurance type: medicaid     Crisis Care Plan Living Arrangements: Parent, Children(mom, dad, 966 yr old daughter, 35 yo son) Armed forces operational officerLegal Guardian: (himself) Name of Psychiatrist: Chrissie Noawilliam lay [place] Name of Therapist: none  Education Status Is patient currently in school?: No  Risk to  self with the past 6 months Suicidal Ideation: No Has patient been a risk to self within the past 6 months prior to admission? : No Suicidal Intent: No Has patient had any suicidal intent within the past 6 months prior to admission? : No Is patient at risk for suicide?: No Suicidal Plan?: No Has patient had any suicidal plan within the past 6 months prior to admission? : No Access to Means: No What has been your use of drugs/alcohol within the last 12 months?: pt denies use Previous Attempts/Gestures: No How many times?: 0 Other Self Harm Risks: pt denies Triggers for Past Attempts: (n/a) Intentional Self Injurious Behavior: None Family Suicide History: Unknown Recent stressful life event(s): Other (Comment)(per dad, pt's wife left and pt not taking meds) Persecutory voices/beliefs?: Yes Depression: No Depression Symptoms: Insomnia, Feeling angry/irritable Substance abuse history and/or treatment for substance abuse?: No Suicide prevention information given to non-admitted patients: Not applicable  Risk to Others within the past 6 months Homicidal Ideation: No Does patient have any lifetime risk of violence toward others beyond the six months prior to admission? : No Thoughts of Harm to Others: No Current Homicidal Intent: No Current Homicidal Plan: No Access to Homicidal Means: No Identified Victim: none History of harm to others?: No Assessment of Violence: None Noted Violent Behavior Description: pt denies hx violence Does patient have access to weapons?: No Criminal Charges Pending?: No Does patient have a court date: No Is patient on probation?: No  Psychosis Hallucinations: None noted Delusions: Grandiose, Persecutory(pt thinks Clinical research associatewriter recording his life, pt says he is a Systems developercelebri)  Mental Status Report Appearance/Hygiene: Unremarkable, In scrubs Eye Contact: Good Motor Activity: Freedom of movement Speech: Logical/coherent, Tangential Level of Consciousness:  Alert Mood: ("perfect") Affect: Anxious, Irritable, Labile Anxiety Level: Moderate Thought Processes: Relevant, Coherent, Tangential Judgement: Impaired Orientation: Person, Place, Time Obsessive Compulsive Thoughts/Behaviors: None  Cognitive Functioning Concentration: Normal Memory: Remote Intact, Recent Intact IQ: Average Insight: Poor Impulse Control: Poor Appetite: Good Sleep: Decreased(per dad pt not sleeping) Vegetative Symptoms: None  ADLScreening [facility] Assessment Services) Patient's cognitive ability adequate to safely complete daily activities?: Yes Patient able to express need for assistance with ADLs?: Yes Independently performs ADLs?: Yes (appropriate for developmental age)  Prior Inpatient Therapy Prior Inpatient Therapy: Yes Prior Therapy Dates: 2012 & 2019 [facility] Reason for Treatment: psychosis  Prior Outpatient Therapy Prior Outpatient Therapy: Yes Prior Therapy Dates: currently Prior Therapy Facilty/Provider(s[place] [facility] [place] Reason for Treatment: psychosis Does patient have an ACCT team?: No Does patient have Intensive In-House Services?  : No Does patient have [facility] services? : No Does patient have P4CC services?: No  ADL Screening (condition at time of admission) Patient's cognitive ability adequate to safely complete daily activities?: Yes Is the patient deaf or have difficulty hearing?: No Does the patient have difficulty seeing, even when wearing glasses/contacts?: No Does the patient have difficulty concentrating, remembering, or making decisions?: No Patient able to  express need for assistance with ADLs?: Yes Does the patient have difficulty dressing or bathing?: No Independently performs ADLs?: Yes (appropriate for developmental age) Does the patient have difficulty walking or climbing stairs?: No Weakness of Legs: None Weakness of Arms/Hands: None  Home Assistive  Devices/Equipment Home Assistive Devices/Equipment: None    Abuse/Neglect Assessment (Assessment to be complete while patient is alone) Abuse/Neglect Assessment Can Be Completed: Yes Physical Abuse: Denies Verbal Abuse: Denies Sexual Abuse: Denies Exploitation of patient/patient's resources: Denies Self-Neglect: Denies     Merchant navy officer (For Healthcare) Does Patient Have a Medical Advance Directive?: No Would patient like information on creating a medical advance directive?: No - Patient declined    Additional Information 1:1 In Past 12 Months?: No CIRT Risk: Yes Elopement Risk: Yes Does patient have medical clearance?: Yes     Disposition:  Disposition Initial Assessment Completed for this Encounter: Yes Disposition of Patient: Inpatient treatment program Type of inpatient treatment program: Adult(tina okonkwo NP recommends inpatient treatment)  This service was provided via telemedicine using a 2-way, interactive audio and video technology.  Names of all persons participating in this telemedicine service and their role in this encounter. Name:    Name:  Role:   Name:  Role:   Name:  Role:     Brad Moreno 03/22/2017 2:06 PM

## 2017-03-22 NOTE — ED Notes (Signed)
Faxed IVC paperwork at 1830.

## 2017-03-22 NOTE — ED Provider Notes (Addendum)
[facility] EMERGENCY DEPARTMENT Provider Note   CSN: [id number] Arrival date & time: 03/22/17  [id number]     History   Chief Complaint Chief Complaint  Patient presents with  . Psychiatric Evaluation    HPI Brad Moreno is a 37 y.o. male.  Level 5 caveat for psychiatric disorder.  Patient complains of feeling stressed out.  He denies suicidal or homicidal ideation.  Patient has a history of schizophrenia and bipolar disorder.  He has not been taking his medicine.  Patient denies auditory or visual hallucination.  Father reports that patient has been pacing lately and "talking out of his head".  Father also reports that patient was talking about the "Sheriff coming to his house to shoot him".  Patient had a verbal altercation with his mother this morning.      Past Medical History:  Diagnosis Date  . Bipolar 1 disorder (HCC)   . Depression 06/2010  . Schizophrenia [facility](HCC)     Patient Active Problem List   Diagnosis Date Noted  . Alcohol use 02/17/2017  . Depression 06/09/2010    History reviewed. No pertinent surgical history.     Home Medications    Prior to Admission medications   Medication Sig Start Date End Date Taking? Authorizing Provider  lithium carbonate (ESKALITH) 450 MG CR tablet Take 450 mg by mouth 2 (two) times daily.   Yes [provider]    Family History Family History  Problem Relation Age of Onset  . Depression Mother   . Diabetes Mother   . Cancer Paternal Grandfather [id number]       Colon Cancer  . Alcohol abuse Brother        Was Heavy drinker and smoker--has quit    Social History Social History   Tobacco Use  . Smoking status: Current Every Day Smoker    Last attempt to quit: 02/11/2002    Years since quitting: 15.1  . Smokeless tobacco: Never Used  Substance Use Topics  . Alcohol use: Yes    Alcohol/week: 1.8 oz    Types: 3 Cans of beer per week  . Drug use: Yes    Types: Marijuana     Allergies   Amoxicillin and  Pork-derived products   Review of Systems Review of Systems  Unable to perform ROS: Psychiatric disorder     Physical Exam Updated Vital Signs BP (!) 164/107 (BP Location: Left Arm)   Pulse 98   Resp 18   Ht 5\' 11"  (1.803 m)   Wt 111.1 kg (245 lb)   SpO2 100%   BMI 34.17 kg/m   Physical Exam  Constitutional: He is oriented to person, place, and time. He appears well-developed and well-nourished.  HENT:  Head: Normocephalic and atraumatic.  Eyes: Conjunctivae are normal.  Neck: Neck supple.  Cardiovascular: Normal rate and regular rhythm.  Pulmonary/Chest: Effort normal and breath sounds normal.  Abdominal: Soft. Bowel sounds are normal.  Musculoskeletal: Normal range of motion.  Neurological: He is alert and oriented to person, place, and time.  Skin: Skin is warm and dry.  Psychiatric:  Flight of ideas, tangential thinking  Nursing note and vitals reviewed.    ED Treatments / Results  Labs (all labs ordered are listed, but only abnormal results are displayed) Labs Reviewed  BASIC METABOLIC PANEL - Abnormal; Notable for the following components:      Result Value   Glucose, Bld 117 (*)    All other components within normal limits  CBC WITH  DIFFERENTIAL/PLATELET  ETHANOL  RAPID URINE DRUG SCREEN, HOSP PERFORMED    EKG  EKG Interpretation None       Radiology No results found.  Procedures Procedures (including critical care time)  Medications Ordered in ED Medications  haloperidol lactate (HALDOL) injection 5 mg (not administered)     Initial Impression / Assessment and Plan / ED Course  I have reviewed the triage vital signs and the nursing notes.  Pertinent labs & imaging results that were available during my care of the patient were reviewed by me and considered in my medical decision making (see chart for details).     Patient with a known history of schizophrenia and bipolar 1 disorder presents with aberrant thinking.  He has been  noncompliant with his medications.  Will obtain behavioral health consult.  Involuntary commitment papers signed.   1415:  Behavioral health consult obtained.  Patient treatment was recommended.  IVC papers filled out. CRITICAL CARE Performed by: Donnetta Hutching Total critical care time: 30 minutes Critical care time was exclusive of separately billable procedures and treating other patients. Critical care was necessary to treat or prevent imminent or life-threatening deterioration. Critical care was time spent personally by me on the following activities: development of treatment plan with patient and/or surrogate as well as nursing, discussions with consultants, evaluation of patient's response to treatment, examination of patient, obtaining history from patient or surrogate, ordering and performing treatments and interventions, ordering and review of laboratory studies, ordering and review of radiographic studies, pulse oximetry and re-evaluation of patient's condition.  Final Clinical Impressions(s) / ED Diagnoses   Final diagnoses:  Schizophrenia, unspecified type [facility])  Bipolar 1 disorder [facility])    ED Discharge Orders    None       Donnetta Hutching, MD 03/22/17 1454    Donnetta Hutching, MD 03/22/17 1500
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
## 2017-03-22 NOTE — ED Notes (Signed)
[place] police officer sitting with an IVC next to this pt and heard pt talking.  States pt was looking at picture on the wall and pt balled both of  his fist up and states I am ready for you.

## 2017-03-22 NOTE — ED Notes (Signed)
Pt wanded by security in triage. 

## 2017-03-23 ENCOUNTER — Other Ambulatory Visit: Payer: Self-pay

## 2017-03-23 ENCOUNTER — Encounter (HOSPITAL_COMMUNITY): Payer: Self-pay

## 2017-03-23 ENCOUNTER — Inpatient Hospital Stay (HOSPITAL_COMMUNITY)
Admission: AD | Admit: 2017-03-23 | Discharge: 2017-03-31 | DRG: 885 | Disposition: A | Discharge status: Home / Self Care | Payer: Medicaid Other | Source: Intra-hospital | Attending: Psychiatry | Admitting: Psychiatry

## 2017-03-23 DIAGNOSIS — F209 Schizophrenia, unspecified: Secondary | ICD-10-CM | POA: Diagnosis present

## 2017-03-23 DIAGNOSIS — Z91018 Allergy to other foods: Secondary | ICD-10-CM

## 2017-03-23 DIAGNOSIS — R451 Restlessness and agitation: Secondary | ICD-10-CM | POA: Diagnosis not present

## 2017-03-23 DIAGNOSIS — Z630 Problems in relationship with spouse or partner: Secondary | ICD-10-CM | POA: Diagnosis not present

## 2017-03-23 DIAGNOSIS — Z880 Allergy status to penicillin: Secondary | ICD-10-CM

## 2017-03-23 DIAGNOSIS — F29 Unspecified psychosis not due to a substance or known physiological condition: Secondary | ICD-10-CM | POA: Diagnosis not present

## 2017-03-23 DIAGNOSIS — F312 Bipolar disorder, current episode manic severe with psychotic features: Secondary | ICD-10-CM | POA: Diagnosis not present

## 2017-03-23 DIAGNOSIS — Z811 Family history of alcohol abuse and dependence: Secondary | ICD-10-CM | POA: Diagnosis not present

## 2017-03-23 DIAGNOSIS — F17210 Nicotine dependence, cigarettes, uncomplicated: Secondary | ICD-10-CM | POA: Diagnosis present

## 2017-03-23 DIAGNOSIS — Z818 Family history of other mental and behavioral disorders: Secondary | ICD-10-CM | POA: Diagnosis not present

## 2017-03-23 DIAGNOSIS — F319 Bipolar disorder, unspecified: Principal | ICD-10-CM | POA: Diagnosis present

## 2017-03-23 DIAGNOSIS — Z79899 Other long term (current) drug therapy: Secondary | ICD-10-CM | POA: Diagnosis not present

## 2017-03-23 DIAGNOSIS — F419 Anxiety disorder, unspecified: Secondary | ICD-10-CM | POA: Diagnosis not present

## 2017-03-23 DIAGNOSIS — G4700 Insomnia, unspecified: Secondary | ICD-10-CM | POA: Diagnosis not present

## 2017-03-23 DIAGNOSIS — F1099 Alcohol use, unspecified with unspecified alcohol-induced disorder: Secondary | ICD-10-CM | POA: Diagnosis not present

## 2017-03-23 DIAGNOSIS — F250 Schizoaffective disorder, bipolar type: Secondary | ICD-10-CM | POA: Diagnosis present

## 2017-03-23 DIAGNOSIS — F1290 Cannabis use, unspecified, uncomplicated: Secondary | ICD-10-CM | POA: Diagnosis not present

## 2017-03-23 DIAGNOSIS — R450 Nervousness: Secondary | ICD-10-CM | POA: Diagnosis not present

## 2017-03-23 DIAGNOSIS — F3160 Bipolar disorder, current episode mixed, unspecified: Secondary | ICD-10-CM | POA: Diagnosis not present

## 2017-03-23 DIAGNOSIS — Z87891 Personal history of nicotine dependence: Secondary | ICD-10-CM | POA: Diagnosis not present

## 2017-03-23 HISTORY — DX: Unspecified psychosis not due to a substance or known physiological condition: F29

## 2017-03-23 LAB — LITHIUM LEVEL: LITHIUM LVL: 0.41 mmol/L — AB (ref 0.60–1.20)

## 2017-03-23 MED ORDER — HALOPERIDOL 5 MG PO TABS
5.0000 mg | ORAL_TABLET | Freq: Every morning | ORAL | Status: DC
  Administered 2017-03-23: 5 mg via ORAL
  Filled 2017-03-23: qty 1

## 2017-03-23 MED ORDER — LITHIUM CARBONATE ER 450 MG PO TBCR
450.0000 mg | EXTENDED_RELEASE_TABLET | Freq: Two times a day (BID) | ORAL | Status: DC
  Administered 2017-03-23: 450 mg via ORAL
  Filled 2017-03-23 (×5): qty 1

## 2017-03-23 MED ORDER — HALOPERIDOL 5 MG PO TABS
5.0000 mg | ORAL_TABLET | Freq: Every morning | ORAL | Status: DC
  Administered 2017-03-24: 5 mg via ORAL
  Filled 2017-03-23 (×2): qty 1

## 2017-03-23 MED ORDER — BENZTROPINE MESYLATE 1 MG PO TABS
0.5000 mg | ORAL_TABLET | Freq: Every day | ORAL | Status: DC
  Administered 2017-03-23: 0.5 mg via ORAL
  Filled 2017-03-23: qty 1

## 2017-03-23 MED ORDER — LITHIUM CARBONATE ER 450 MG PO TBCR
450.0000 mg | EXTENDED_RELEASE_TABLET | Freq: Two times a day (BID) | ORAL | Status: DC
  Administered 2017-03-23 – 2017-03-24 (×2): 450 mg via ORAL
  Filled 2017-03-23 (×5): qty 1

## 2017-03-23 MED ORDER — BENZTROPINE MESYLATE 0.5 MG PO TABS
0.5000 mg | ORAL_TABLET | Freq: Every day | ORAL | Status: DC
  Administered 2017-03-24: 0.5 mg via ORAL
  Filled 2017-03-23 (×2): qty 1

## 2017-03-23 NOTE — Progress Notes (Signed)
Pt. meets criteria for inpatient treatment per Leighton Ruffina Okonkwo, NP Referred out to the following hospitals:  [facility]     [facility]  [facility]  [facility]  [facility]  [facility]  [facility]  [facility]        Disposition CSW will continue to follow for placement.  Timmothy EulerJean T. Kaylyn LimSutter, MSW, LCSWA Disposition Clinical Social Work [phone] (cell) [phone] (office)

## 2017-03-23 NOTE — BH Assessment (Signed)
[facility] Assessment Progress Note  Pt reassessed today. Pt is labile in mood with tangential speech. He also exhibits flight of ideas and thought blocking. IP treatment continues to be recommended for pt.   Johny ShockSamantha M. Ladona Ridgelaylor, MS, NCC, LPCA Counselor

## 2017-03-23 NOTE — ED Notes (Signed)
[facility]. Dr. Richrd PrimeMcMannus informed.

## 2017-03-23 NOTE — Progress Notes (Signed)
[facility] Bed [facility]2 Brad SievertSpencer Simon, PA is the accepting provider.  Dr. Altamese Carolinaainville is the attending provider.  Call report to [phone]  [facility].   Pt is IVC   Pt may be transported by [employer] Pt scheduled  [facility] as [facility] can be arranged.  Timmothy EulerJean T. Kaylyn LimSutter, MSW, LCSWA Disposition Clinical Social Work [phone] (cell) [phone] (office)

## 2017-03-23 NOTE — Progress Notes (Signed)
Brad Moreno is a 37 year old male being admitted involuntarily to 63507-2 from [facility].  He came to the ED with his father for being delusional and not taking his psychiatric medications.  He stated that he stopped taking his lithium because it "felt like by brain was exploding."  He was delusional, thinking he was a Research scientist (physical sciences)superstar celebrity.  [facility], he denies SI/HI or A/V hallucinations.  He did appear to be responding to internal stimuli, laughing at inappropriate times.  He was alert and oriented x 3.  He was pleasant and cooperative and when asked what symptoms he was having, he responded "nothing, I feel good."  He denies any pain or discomfort and appears to be in no physical distress.  Oriented him to the unit.  Admission paperwork completed and signed.  Belongings searched and secured in locker # 14, no contraband found.  Skin assessment completed and no skin issues noted.  Q 15 minute checks initiated for safety.  We will continue to monitor the progress towards his goals.

## 2017-03-23 NOTE — Tx Team (Signed)
Initial Treatment Plan 03/23/2017 7:01 PM Ethelda ChickJonathan E Haring ZOX:[id number]    PATIENT STRESSORS: Medication change or noncompliance Other: Ongoing mental illness   PATIENT STRENGTHS: Motivation for treatment/growth Physical Health Supportive family/friends   PATIENT IDENTIFIED PROBLEMS: Psychosis  "I don't know, I guess my family needs to be here and not me"                   DISCHARGE CRITERIA:  Improved stabilization in mood, thinking, and/or behavior Verbal commitment to aftercare and medication compliance  PRELIMINARY DISCHARGE PLAN: Outpatient therapy Medication management  PATIENT/FAMILY INVOLVEMENT: This treatment plan has been presented to and reviewed with the patient, Ethelda ChickJonathan E Puccio.  The patient and family have been given the opportunity to ask questions and make suggestions.  Levin BaconHeather V Tydarius Yawn, RN 03/23/2017, 7:01 PM

## 2017-03-23 NOTE — BH Assessment (Signed)
Chart reviewed with shuvon rankin np- noted patient acutely psychotic, delusional. Orders received from shuvon rankin np- to obtain lithium level, start haldol 5mg  po daily with cogentin 0.5mg  po daily.  obtain EKG. Notified Dr. Clarene DukeMcManus of above and this was also relayed to [place] SpringsBrad patients primary RN. [facility] at this time.

## 2017-03-23 NOTE — Progress Notes (Signed)
  DATA ACTION RESPONSE  Objective- Pt. is visible in the dayroom, seen watching TV.Presents with an anxious affect and mood.No further c/o. Guarded and minimal with interaction. Subjective- Denies having any SI/HI/Pain at this time. +AVH.Is cooperative and remains safe on the unit.  1:1 interaction in private to establish rapport. Encouragement, education, & support given from staff.    Safety maintained with Q 15 checks. Continue with POC.

## 2017-03-24 DIAGNOSIS — F3160 Bipolar disorder, current episode mixed, unspecified: Secondary | ICD-10-CM

## 2017-03-24 DIAGNOSIS — R450 Nervousness: Secondary | ICD-10-CM

## 2017-03-24 DIAGNOSIS — Z811 Family history of alcohol abuse and dependence: Secondary | ICD-10-CM

## 2017-03-24 DIAGNOSIS — F419 Anxiety disorder, unspecified: Secondary | ICD-10-CM

## 2017-03-24 DIAGNOSIS — Z818 Family history of other mental and behavioral disorders: Secondary | ICD-10-CM

## 2017-03-24 DIAGNOSIS — F29 Unspecified psychosis not due to a substance or known physiological condition: Secondary | ICD-10-CM

## 2017-03-24 MED ORDER — MAGNESIUM HYDROXIDE 400 MG/5ML PO SUSP
15.0000 mL | Freq: Every day | ORAL | Status: DC | PRN
  Administered 2017-03-25: 15 mL via ORAL
  Filled 2017-03-24: qty 30

## 2017-03-24 MED ORDER — ALUM & MAG HYDROXIDE-SIMETH 200-200-20 MG/5ML PO SUSP: 15.0000 mL | ORAL | Status: DC | PRN

## 2017-03-24 MED ORDER — LORAZEPAM 1 MG PO TABS
1.0000 mg | ORAL_TABLET | ORAL | Status: AC | PRN
  Administered 2017-03-25: 1 mg via ORAL
  Filled 2017-03-24: qty 1

## 2017-03-24 MED ORDER — ZIPRASIDONE MESYLATE 20 MG IM SOLR
20.0000 mg | INTRAMUSCULAR | Status: AC | PRN
  Administered 2017-03-27: 20 mg via INTRAMUSCULAR
  Filled 2017-03-24: qty 20

## 2017-03-24 MED ORDER — OLANZAPINE 10 MG PO TBDP
10.0000 mg | ORAL_TABLET | Freq: Three times a day (TID) | ORAL | Status: DC | PRN
  Administered 2017-03-25 – 2017-03-28 (×2): 10 mg via ORAL
  Filled 2017-03-24 (×4): qty 1

## 2017-03-24 MED ORDER — TRAZODONE HCL 50 MG PO TABS
50.0000 mg | ORAL_TABLET | Freq: Every evening | ORAL | Status: DC | PRN
  Administered 2017-03-24 (×2): 50 mg via ORAL
  Filled 2017-03-24: qty 1

## 2017-03-24 MED ORDER — DIVALPROEX SODIUM 500 MG PO DR TAB
500.0000 mg | DELAYED_RELEASE_TABLET | Freq: Two times a day (BID) | ORAL | Status: DC
  Administered 2017-03-24 – 2017-03-28 (×8): 500 mg via ORAL
  Filled 2017-03-24 (×14): qty 1

## 2017-03-24 MED ORDER — ARIPIPRAZOLE 10 MG PO TABS
10.0000 mg | ORAL_TABLET | Freq: Every day | ORAL | Status: DC
  Administered 2017-03-24 – 2017-03-25 (×2): 10 mg via ORAL
  Filled 2017-03-24 (×3): qty 1

## 2017-03-24 MED ORDER — ACETAMINOPHEN 325 MG PO TABS
650.0000 mg | ORAL_TABLET | Freq: Four times a day (QID) | ORAL | Status: DC | PRN
  Administered 2017-03-27: 650 mg via ORAL
  Administered 2017-03-28: 325 mg via ORAL
  Administered 2017-03-31: 650 mg via ORAL
  Filled 2017-03-24 (×3): qty 2

## 2017-03-24 MED ORDER — HYDROXYZINE HCL 50 MG PO TABS
50.0000 mg | ORAL_TABLET | Freq: Four times a day (QID) | ORAL | Status: DC | PRN
  Administered 2017-03-25 – 2017-03-31 (×5): 50 mg via ORAL
  Filled 2017-03-24 (×5): qty 1
  Filled 2017-03-24: qty 10

## 2017-03-24 NOTE — Progress Notes (Signed)
Recreation Therapy Notes  INPATIENT RECREATION THERAPY ASSESSMENT  Patient Details Name: Brad Moreno MRN: [id number] DOB: 07/20/1980 Today's Date: 03/24/2017       Information Obtained From: Patient  Able to Participate in Assessment/Interview: Yes  Patient Presentation: Alert, Oriented  Reason for Admission (Per Patient): Other (Comments)(Not being able to focus around son; medication change)  Patient Stressors: Family, Relationship(Pt stated his son was Moreno stressor and his relationship with his wife was stressful)  Coping Skills:   Isolation, Journal, Sports, TV, Arguments, Aggression, Music, Exercise, Meditate, Deep Breathing, Substance Abuse, Art, Read, Hot Bath/Shower  Leisure Interests (2+):  Individual - TV, Individual - Other (Comment)(Shower; fantasize about women)  Frequency of Recreation/Participation: Other (Comment)(Daily)  Awareness of Community Resources:  Yes  Community Resources:  Library, [facility], [facility]  Current Use: Yes  Expressed Interest in [employer] CorporationCommunity Resource Information: Yes  Patient Main Form of Transportation: Set designerCar  Patient Strengths:  Physical; strong mind  Patient Identified Areas of Improvement:  Separating fantasy from real life  Current Recreation Participation:  Daily  Patient Goal for Hospitalization:  "Get back to the way things used to be, find an apartment and file taxes"  [place]  [place]idence[place]  Current SI (including self-harm):  No  Current HI:  No  Current AVH: No  Staff Intervention Plan: Group Attendance  Consent to Intern Participation: N/Moreno    Brad Moreno, Brad Moreno  Brad Moreno, Brad Moreno 03/24/2017, 1:24 PM
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
## 2017-03-24 NOTE — B[facility] Suicide Risk Assessment (Signed)
[name]   Nursing information obtained from:  Patient Demographic factors:  Male Current Mental Status:  NA Loss Factors:  Financial problems / change in socioeconomic status Historical Factors:  NA Risk Reduction Factors:  Living with another person, especially a relative  Total Time spent with patient: 1 hour Principal Problem: Bipolar disorder, unspecified (HCC) Diagnosis:   Patient Active Problem List   Diagnosis Date Noted  . Psychosis (HCC) [F29] 03/23/2017  . Alcohol use [Z78.9] 02/17/2017  . Bipolar disorder, unspecified (HCC) [F31.9] 06/09/2010   Subjective Data: See H&P for full HPI  Brad Moreno is a 37 y/o M with reported history of schizophrenia and bipolar disorder who was admitted on IVC placed by pt's wife with worsening symptoms of agitation and disorganization. Pt had been talking to himself and had non-goal directed activities of placing objects in the driveway and pouring trash all over the yard. 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 Pt agreed to be changed from lithium to Depakote. He also agreed to trial of abilify.  Continued Clinical Symptoms:  Alcohol Use Disorder Identification Test Final Score (AUDIT): 4 The "Alcohol Use Disorders Identification Test", Guidelines for Use in Primary Care, Second Edition.  World Science writer [facility]). Score between 0-7:  no or low risk or alcohol related problems. Score between 8-15:  moderate risk of alcohol related problems. Score between 16-19:  high risk of alcohol related problems. Score 20 or above:  warrants further diagnostic evaluation for alcohol dependence and treatment.   CLINICAL FACTORS:   Severe Anxiety and/or Agitation Bipolar Disorder:   Mixed State More than one psychiatric diagnosis Currently Psychotic Unstable or Poor Therapeutic Relationship Previous Psychiatric Diagnoses and Treatments   Musculoskeletal: Strength & Muscle Tone:  within normal limits Gait & Station: normal Patient leans: N/A  Psychiatric Specialty Exam: Physical Exam  Nursing note and vitals reviewed.   ROS - see H&P  Blood pressure 123/68, pulse 94, temperature 98.2 F (36.8 C), temperature source Oral, resp. rate 20, height 5' 8.75" (1.746 m), weight 95.3 kg (210 lb).Body mass index is 31.24 kg/m.  General Appearance: Casual  Eye Contact:  Good  Speech:  Clear and Coherent and Normal Rate  Volume:  Normal  Mood:  Anxious  Affect:  Congruent and Constricted  Thought Process:  Coherent, Disorganized, Goal Directed and Descriptions of Associations: Loose  Orientation:  Full (Time, Place, and Person)  Thought Content:  Logical and Ideas of Reference:   Delusions  Suicidal Thoughts:  No  Homicidal Thoughts:  No  Memory:  Immediate;   Fair Recent;   Fair Remote;   Fair  Judgement:  Poor  Insight:  Lacking  Psychomotor Activity:  Normal  Concentration:  Concentration: Fair  Recall:  [employer] of Knowledge:  Fair  Language:  Fair  Akathisia:  No  Handed:    AIMS (if indicated):     Assets:  Communication Skills Resilience Social Support  ADL's:  Intact  Cognition:  WNL  Sleep:  Number of Hours: 3.25        COGNITIVE FEATURES THAT CONTRIBUTE TO RISK:  None    SUICIDE RISK:   Minimal: No identifiable suicidal ideation.  Patients presenting with no risk factors but with morbid ruminations; may be classified as minimal risk based on the severity of the depressive symptoms  PLAN OF CARE:   - admit to inpatient level of care  - Bipolar unspecified   - start depakote  DR 500mg  po BID   - Start abilify 10mg  po qDay  - continue all other current orders without changes   I certify that inpatient services furnished can reasonably be expected to improve the patient's condition.   Brad Likenshristopher T Drema Eddington, MD 03/24/2017, 5:25 PM
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
## 2017-03-24 NOTE — Progress Notes (Signed)
Recreation Therapy Notes  Date: 03/24/17 Time: 1015 Location: 500 Hall Dayroom  Group Topic: Leisure Education  Goal Area(s) Addresses:  Patient will identify positive leisure activities.  Patient will identify one positive benefit of participation in leisure activities.   Behavioral Response: Engaged  Intervention: Dry erase marker, white board  Activity: Pictionary.  The group was divided into two teams.  One person from the team would come to the board and draw the picture.  LRT would tell the person what to draw.  Each team would get one minute to guess the picture.  If the team did not guess the picture, the opposing team would get a chance to steal.  Education:  Leisure Education, Discharge Planning  Education Outcome: Acknowledges education/In group clarification offered/Needs additional education  Clinical Observations/Feedback: Pt was engaged, smiling and appropriate during group.  Pt seemed to be having a good time and was active throughout activity.     Caroll RancherMarjette Undrea Shipes, LRT/CTRS      Caroll RancherLindsay, Caniyah Murley A 03/24/2017 12:18 PM

## 2017-03-24 NOTE — Progress Notes (Signed)
Adult Psychoeducational Group Note  Date:  03/24/2017 Time:  8:32 PM  Group Topic/Focus:  Wrap-Up Group:   The focus of this group is to help patients review their daily goal of treatment and discuss progress on daily workbooks.  Participation Level:  Active  Participation Quality:  Appropriate  Affect:  Appropriate  Cognitive:  Appropriate  Insight: Appropriate  Engagement in Group:  Engaged  Modes of Intervention:  Discussion  Additional Comments:The patiient expressed that he attended groups and rates his day a 6.  Brad Moreno, Brad Moreno 03/24/2017, 8:32 PM

## 2017-03-24 NOTE — [facility] Group Notes (Signed)
[facility] 03/24/2017 1:15pm  Type of Therapy: Mental Health Association Presentation  Participation Level: Active  Participation Quality: Attentive  Affect: Appropriate  Cognitive: Oriented  Insight: Developing/Improving  Engagement in Therapy: Engaged  Modes of Intervention: Discussion, Education and Socialization  Summary of Progress/Problems: Mental Health Association (MHA) Speaker came to talk about his personal journey with mental health. The pt processed ways by which to relate to the speaker. MHA speaker provided handouts and educational information pertaining to groups and services offered by the [facility]. Pt was engaged in speaker's presentation and was receptive to resources provided.    Lorri FrederickWierda, Harlee Pursifull Jon, LCSW 03/24/2017 3:46 PM

## 2017-03-24 NOTE — Progress Notes (Signed)
DATA ACTION RESPONSE  Objective- Pt. is visible at nurses station, seen talking to self loudly. Presents with an animated/anxious affect and mood. Pt was hyperactive and restless this evening.  Subjective- Denies having any SI/HI/Pain at this time. +AVH and internal stimuli.Is cooperative and remains safe on the unit.  1:1 interaction in private to establish rapport. Encouragement, education, & support given from staff. PRN trazodone with repeat requested and given. Will re-eval.    Safety maintained with Q 15 checks. Continue with POC.

## 2017-03-24 NOTE — Progress Notes (Signed)
Patient denies SI, HI and AVH.  Patient was noted to be on the unit pacing, dancing in the day room and throwing an imaginary football.  Patient has been in a bright mood and even though patient denied SI, HI and AVH patient was noted to be responding to internal stimuli.   Assess patient for safety, offer medications as prescribed, engage patient in 1:1 therapeutic staff talks, monitor patient for safety.   Patient was able to contract for safety. Continue to monitor as planned.

## 2017-03-24 NOTE — Progress Notes (Signed)
Pt has been pacing all night; seen dancing in the halls. Pt appears to be responding to internal stimuli. Poor boundaries and exhibiting bizarre behaviors.

## 2017-03-25 DIAGNOSIS — Z87891 Personal history of nicotine dependence: Secondary | ICD-10-CM

## 2017-03-25 MED ORDER — NICOTINE POLACRILEX 2 MG MT GUM
2.0000 mg | CHEWING_GUM | OROMUCOSAL | Status: DC | PRN
  Administered 2017-03-25 – 2017-03-27 (×4): 2 mg via ORAL
  Filled 2017-03-25 (×2): qty 1

## 2017-03-25 MED ORDER — ARIPIPRAZOLE 15 MG PO TABS
15.0000 mg | ORAL_TABLET | Freq: Every day | ORAL | Status: DC
  Administered 2017-03-26 – 2017-03-28 (×3): 15 mg via ORAL
  Filled 2017-03-25 (×5): qty 1

## 2017-03-25 NOTE — [facility] Group Notes (Signed)
Pt attended group led by chaplain Ashley MarinerMatt Stalnaker.  Group topic was hope. Pt participated at times, off topic some, talked about being world class athlete.  Pt also got up and walked in and out of the group room several times.  Pt was engaged when he was present. Garner NashGregory Tongela Encinas, MSW, LCSW Clinical Social Worker 03/25/2017 3:08 PM

## 2017-03-25 NOTE — [facility] Suicide Risk Assessment (Signed)
[facility] INPATIENT:  Family/Significant Other Suicide Prevention Education  Suicide Prevention Education:  Education Completed; Brad Moreno, father, [phone], has been identified by the patient as the family member/significant other with whom the patient will be residing, and identified as the person(s) who will aid the patient in the event of a mental health crisis (suicidal ideations/suicide attempt).  With written consent from the patient, the family member/significant other has been provided the following suicide prevention education, prior to the and/or following the discharge of the patient.  The suicide prevention education provided includes the following:  Suicide risk factors  Suicide prevention and interventions  National Suicide Hotline telephone number  [facility] assessment telephone number  [facility] Emergency Assistance 911  [facility] and/or [facility]idential [facility] telephone number  Request made of family/significant other to:  Remove weapons (e.g., guns, rifles, knives), all items previously/currently identified as safety concern.  Brad SorrowJerry has 2 guns but keeps them locked up.  Remove drugs/medications (over-the-counter, prescriptions, illicit drugs), all items previously/currently identified as a safety concern.  The family member/significant other verbalizes understanding of the suicide prevention education information provided.  The family member/significant other agrees to remove the items of safety concern listed above.  Pt has been under some stress.  Separated from wife 4-5 months ago.  Lost one of his two jobs.  Has not been taking his medications--[facility].  Pt got worse 2-3 weeks ago, probably lost his other job due to talking to customers in a strange way.  Prior to admission, pt talking to himself, seeing things.  He does much better when he is on his medication.  Brad Moreno, Brad Helzer Jon,  LCSW 03/25/2017, 3:50 PM

## 2017-03-25 NOTE — Progress Notes (Signed)
Recreation Therapy Notes  Date: 03/25/17 Time: 1010 Location: 500 Hall Dayroom  Group Topic: Music  Goal Area(s) Addresses:   Patient will verbalize benefit of listening to music. Patient will verbalize positive effect of using music post d/c.   Behavioral Response: Engaged  Intervention:  Music  Activity:  Music Therapy.  LRT played soothing music in the dayroom, creating a peaceful environment for the patients.   Education: Music, Discharge Planning  Education Outcome: Acknowledges understanding/In group clarification offered/Needs additional education.   Clinical Observations/Feedback: Pt was engaged.  Pt was singing along and bobbing his head to the music.  Pt was pleasant and appropriate during group.      Caroll RancherMarjette Seydou Hearns, LRT/CTRS      Caroll RancherLindsay, Amberlin Utke A 03/25/2017 12:18 PM

## 2017-03-25 NOTE — Progress Notes (Signed)
[facility] MD Progress Note  03/25/2017 1:28 PM Brad Moreno  MRN:  [id number] Subjective:    Brad Moreno is a 37 y/o M with reported history of schizophrenia and bipolar disorder who was admitted on IVC placed by pt's wife with worsening symptoms of agitation and disorganization. Pt had been talking to himself and had non-goal directed activities of placing objects in the driveway and pouring trash all over the yard. 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 Pt agreed to be changed from lithium to Depakote. He also agreed to trial of abilify.  As per RN report, pt had bizarre/agitated behaviors this AM and was provoking peers. He was making multiple inappropriate gestures to attempt to provoke peers into escalating conflict, but he was able to be redirected. He also had excess energy, yelling and talking to himself loudly. He was also pacing on the unit, dancing, and hopping/jumping - causing disturbance to other patients.  Upon interview today, pt is irritable, pressured, and minimally able to participate in the interview. He states, "I'm tired of not being treated equally," and he goes on to explain that nursing staff has unfairly singled him out; however, pt was unable to provide specific detail of how nursing staff had treated him unfairly. He denies SI/AH/VH. He endorses HI towards "the person who's got my daughter" but he has no plan or intent to follow through with his HI. Pt grew increasingly irritated during interview, and he began cursing repeatedly, so interview was concluded. We will increase dose of abilify and continue on his other current regimen without changes.  Principal Problem: Bipolar disorder, unspecified (HCC) Diagnosis:   Patient Active Problem List   Diagnosis Date Noted  . Psychosis (HCC) [F29] 03/23/2017  . Alcohol use [Z78.9] 02/17/2017  . Bipolar disorder, unspecified (HCC) [F31.9] 06/09/2010   Total Time spent with patient: 30  minutes  Past Psychiatric History: see H&P  Past Medical History:  Past Medical History:  Diagnosis Date  . Bipolar 1 disorder (HCC)   . Depression 06/2010  . Schizophrenia (HCC)    History reviewed. No pertinent surgical history. Family History:  Family History  Problem Relation Age of Onset  . Depression Mother   . Diabetes Mother   . Cancer Paternal Grandfather 20       Colon Cancer  . Alcohol abuse Brother        Was Heavy drinker and smoker--has quit   Family Psychiatric  History: see H&P Social History:  Social History   Substance and Sexual Activity  Alcohol Use Yes  . Alcohol/week: 1.8 oz  . Types: 3 Cans of beer per week     Social History   Substance and Sexual Activity  Drug Use Yes  . Types: Marijuana    Social History   Socioeconomic History  . Marital status: Legally Separated    Spouse name: None  . Number of children: None  . Years of education: None  . Highest education level: None  Social Needs  . Financial resource strain: None  . Food insecurity - worry: None  . Food insecurity - inability: None  . Transportation needs - medical: None  . Transportation needs - non-medical: None  Occupational History  . None  Tobacco Use  . Smoking status: Former Smoker    Packs/day: 0.50  . Smokeless tobacco: Never Used  Substance and Sexual Activity  . Alcohol use: Yes    Alcohol/week: 1.8 oz    Types: 3 Cans  of beer per week  . Drug use: Yes    Types: Marijuana  . Sexual activity: No  Other Topics Concern  . None  Social History Narrative   Entered at PCP Office 07/2013:   On Disability   Has 2 children--2 girls--age 578 y/o and 2/ y/o   At home, it is him and the kids.   Says he is separated. Says he goes to gym 5 days per week. The children's mother will watch children while he goes to the gym.   Says he has the kids himself most of the time.    Says he wants to go back to school himself-- once his youngest child gets into school.     Additional Social History:                         Sleep: Fair  Appetite:  Fair  Current Medications: Current Facility-Administered Medications  Medication Dose Route Frequency Provider Last Rate Last Dose  . acetaminophen (TYLENOL) tablet 650 mg  650 mg Oral Q6H PRN Micheal [place], Sean Macwilliams T, MD      . alum & mag hydroxide-simeth (MAALOX/MYLANTA) 200-200-20 MG/5ML suspension 15 mL  15 mL Oral Q4H PRN Micheal [place], Tyah Acord T, MD      . ARIPiprazole (ABILIFY) tablet 10 mg  10 mg Oral Daily Micheal [place], Michaelpaul Apo T, MD   10 mg at 03/25/17 0750  . divalproex (DEPAKOTE) DR tablet 500 mg  500 mg Oral Q12H Micheal [place], Kitana Gage T, MD   500 mg at 03/25/17 0751  . hydrOXYzine (ATARAX/VISTARIL) tablet 50 mg  50 mg Oral Q6H PRN Micheal [place], Zerina Hallinan T, MD   50 mg at 03/25/17 0352  . OLANZapine zydis (ZYPREXA) disintegrating tablet 10 mg  10 mg Oral Q8H PRN Micheal [place], Kollyns Mickelson T, MD   10 mg at 03/25/17 [id number]   And  . LORazepam (ATIVAN) tablet 1 mg  1 mg Oral PRN Micheal [place], Dagen Beevers T, MD       And  . ziprasidone (GEODON) injection 20 mg  20 mg Intramuscular PRN Micheal [place], Victoria Euceda T, MD      . magnesium hydroxide (MILK OF MAGNESIA) suspension 15 mL  15 mL Oral Daily PRN Micheal [place], Kaelei Wheeler T, MD      . nicotine polacrilex (NICORETTE) gum 2 mg  2 mg Oral PRN Micheal [place], Enda Santo T, MD   2 mg at 03/25/17 1122    Lab Results: No results found for this or any previous visit (from the past 48 hour(s)).  Blood Alcohol level:  Lab Results  Component Value Date   ETH <10 03/22/2017   ETH <10 03/03/2017    Metabolic Disorder Labs: No results found for: HGBA1C, MPG No results found for: PROLACTIN Lab Results  Component Value Date   CHOL 176 02/17/2017   TRIG 153 (H) 02/17/2017   HDL 66 02/17/2017   CHOLHDL 2.7 02/17/2017   VLDL 19 09/14/2013   LDLCALC 96 09/14/2013    Physical Findings: AIMS: Facial and Oral Movements Muscles of Facial Expression: None,  normal Lips and Perioral Area: None, normal Jaw: None, normal Tongue: None, normal,Extremity Movements Upper (arms, wrists, hands, fingers): None, normal Lower (legs, knees, ankles, toes): None, normal, Trunk Movements Neck, shoulders, hips: None, normal, Overall Severity Severity of abnormal movements (highest score from questions above): None, normal Incapacitation due to abnormal movements: None, normal Patient's awareness of abnormal movements (rate only patient's report): No Awareness, Dental Status Current problems with teeth and/or dentures?: No Does patient usually wear  dentures?: No  CIWA:    COWS:     Musculoskeletal: Strength & Muscle Tone: within normal limits Gait & Station: normal Patient leans: N/A  Psychiatric Specialty Exam: Physical Exam  Nursing note and vitals reviewed.   Review of Systems  Constitutional: Negative for chills and fever.  Respiratory: Negative for cough and shortness of breath.   Cardiovascular: Negative for chest pain.  Gastrointestinal: Negative for abdominal pain, heartburn, nausea and vomiting.  Psychiatric/Behavioral: Negative for depression, hallucinations and suicidal ideas. The patient is nervous/anxious.     Blood pressure 136/82, pulse (!) 109, temperature 98.3 F (36.8 C), temperature source Oral, resp. rate 20, height 5' 8.75" (1.746 m), weight 95.3 kg (210 lb).Body mass index is 31.24 kg/m.  General Appearance: Casual and Disheveled  Eye Contact:  Fair  Speech:  Clear and Coherent and Normal Rate  Volume:  Normal  Mood:  Anxious, Dysphoric and Irritable  Affect:  Blunt, Inappropriate and Labile  Thought Process:  Coherent, Goal Directed and Descriptions of Associations: Loose  Orientation:  Full (Time, Place, and Person)  Thought Content:  Delusions, Ideas of Reference:   Paranoia Delusions, Paranoid Ideation and Rumination  Suicidal Thoughts:  No  Homicidal Thoughts:  Yes.  without intent/plan  Memory:  Immediate;    Fair Recent;   Fair Remote;   Fair  Judgement:  Poor  Insight:  Lacking  Psychomotor Activity:  Increased  Concentration:  Concentration: Poor  Recall:  Poor  Fund of Knowledge:  Poor  Language:  Poor  Akathisia:  No  Handed:    AIMS (if indicated):     Assets:  Resilience  ADL's:  Intact  Cognition:  WNL  Sleep:  Number of Hours: 4.25    Treatment Plan Summary: Daily contact with patient to assess and evaluate symptoms and progress in treatment and Medication management. Pt has agitation, irritability, bizarre/disorganized behaviors, behaviors provoking other patients, and poor insight regarding his current symptoms. He agrees to increase dose of abilify.   - Continue inpatient hospitalization  - Bipolar unspecified             - Continue depakote DR 500mg  po BID             - Change abilify 10mg  po qDay to abilify 15mg  po qDay  -Anxiety   - Continue atarax 50mg  po q6h prn anxiety  -Agitation   - Continue protocol with zydis/ativan/geodon PRN agitation  - Encourage participation in groups and the therapeutic milieu  -Discharge planning will be ongoing  Micheal Likens, MD 03/25/2017, 1:28 PM

## 2017-03-25 NOTE — Progress Notes (Signed)
DAR NOTE: Patient presents with anxious affect and labile mood.  Pt has observed pacing on the hall way, rapping and sig loud. Pt became irritable this morning during med pass, cooled off after redirection. Pt observed dropping his pants on the hallway. Denies pain, auditory and visual hallucinations.  Rates depression at 4, hopelessness at 3, and anxiety at 7.  Maintained on routine safety checks.  Medications given as prescribed.  Support and encouragement offered as needed.  Attended group and participated.  States goal for today is " staying clean."  Patient observed socializing with peers in the dayroom, will continue to monitor.

## 2017-03-25 NOTE — Tx Team (Signed)
Interdisciplinary Treatment and Diagnostic Plan Update  03/25/2017 Time of Session: [place] MRN: [id number]  Principal Diagnosis: Bipolar disorder, unspecified ([place])  Secondary Diagnoses: Principal Problem:   Bipolar disorder, unspecified ([place]) Active Problems:   Psychosis ([place])   Current Medications:  Current Facility-Administered Medications  Medication Dose Route Frequency Provider Last Rate Last Dose  . acetaminophen (TYLENOL) tablet 650 mg  650 mg Oral Q6H PRN Pennelope Bracken, MD      . alum & mag hydroxide-simeth (MAALOX/MYLANTA) 200-200-20 MG/5ML suspension 15 mL  15 mL Oral Q4H PRN Pennelope Bracken, MD      . Derrill Memo ON 03/26/2017] ARIPiprazole (ABILIFY) tablet 15 mg  15 mg Oral Daily [place], Christopher T, MD      . divalproex (DEPAKOTE) DR tablet 500 mg  500 mg Oral Q12H Pennelope Bracken, MD   500 mg at 03/25/17 0751  . hydrOXYzine (ATARAX/VISTARIL) tablet 50 mg  50 mg Oral Q6H PRN Pennelope Bracken, MD   50 mg at 03/25/17 0352  . OLANZapine zydis (ZYPREXA) disintegrating tablet 10 mg  10 mg Oral Q8H PRN Pennelope Bracken, MD   10 mg at 03/25/17 1696   And  . LORazepam (ATIVAN) tablet 1 mg  1 mg Oral PRN Pennelope Bracken, MD       And  . ziprasidone (GEODON) injection 20 mg  20 mg Intramuscular PRN Pennelope Bracken, MD      . magnesium hydroxide (MILK OF MAGNESIA) suspension 15 mL  15 mL Oral Daily PRN Pennelope Bracken, MD      . nicotine polacrilex (NICORETTE) gum 2 mg  2 mg Oral PRN Pennelope Bracken, MD   2 mg at 03/25/17 1122   PTA Medications: Medications Prior to Admission  Medication Sig Dispense Refill Last Dose  . lithium carbonate (ESKALITH) 450 MG CR tablet Take 450 mg by mouth 2 (two) times daily.   03/22/2017 at Unknown time    Patient Stressors: Medication change or noncompliance Other: Ongoing mental illness  Patient Strengths: Motivation for treatment/growth Physical  Health Supportive family/friends  Treatment Modalities: Medication Management, Group therapy, Case management,  1 to 1 session with clinician, Psychoeducation, Recreational therapy.   Physician Treatment Plan for Primary Diagnosis: Bipolar disorder, unspecified ([place]) Long Term Goal(s): Improvement in symptoms so as ready for discharge Improvement in symptoms so as ready for discharge   Short Term Goals: Ability to demonstrate self-control will improve Compliance with prescribed medications will improve  Medication Management: Evaluate patient's response, side effects, and tolerance of medication regimen.  Therapeutic Interventions: 1 to 1 sessions, Unit Group sessions and Medication administration.  Evaluation of Outcomes: Not Met  Physician Treatment Plan for Secondary Diagnosis: Principal Problem:   Bipolar disorder, unspecified ([place]) Active Problems:   Psychosis ([place])  Long Term Goal(s): Improvement in symptoms so as ready for discharge Improvement in symptoms so as ready for discharge   Short Term Goals: Ability to demonstrate self-control will improve Compliance with prescribed medications will improve     Medication Management: Evaluate patient's response, side effects, and tolerance of medication regimen.  Therapeutic Interventions: 1 to 1 sessions, Unit Group sessions and Medication administration.  Evaluation of Outcomes: Not Met   RN Treatment Plan for Primary Diagnosis: Bipolar disorder, unspecified ([place]) Long Term Goal(s): Knowledge of disease and therapeutic regimen to maintain health will improve  Short Term Goals: Ability to identify and develop effective coping behaviors will improve and Compliance with prescribed medications will improve  Medication Management: RN will administer medications as ordered by provider, will assess and evaluate patient's response and provide education to patient for prescribed medication. RN will report any adverse and/or side  effects to prescribing provider.  Therapeutic Interventions: 1 on 1 counseling sessions, Psychoeducation, Medication administration, Evaluate responses to treatment, Monitor vital signs and CBGs as ordered, Perform/monitor CIWA, COWS, AIMS and Fall Risk screenings as ordered, Perform wound care treatments as ordered.  Evaluation of Outcomes: Not Met   LCSW Treatment Plan for Primary Diagnosis: Bipolar disorder, unspecified ([place]) Long Term Goal(s): Safe transition to appropriate next level of care at discharge, Engage patient in therapeutic group addressing interpersonal concerns.  Short Term Goals: Engage patient in aftercare planning with referrals and resources, Increase social support and Increase skills for wellness and recovery  Therapeutic Interventions: Assess for all discharge needs, 1 to 1 time with Social worker, Explore available resources and support systems, Assess for adequacy in community support network, Educate family and significant other(s) on suicide prevention, Complete Psychosocial Assessment, Interpersonal group therapy.  Evaluation of Outcomes: Not Met   Progress in Treatment: Attending groups: Yes. Participating in groups: Yes. Taking medication as prescribed: Yes. Toleration medication: Yes. Family/Significant other contact made: No, will contact:  father Patient understands diagnosis: No. Discussing patient identified problems/goals with staff: Yes. Medical problems stabilized or resolved: Yes. Denies suicidal/homicidal ideation: Yes. Issues/concerns per patient self-inventory: No. Other: none  New problem(s) identified: No, Describe:  none  New Short Term/Long Term Goal(s):Pt goal: "to get peace of mind"  Discharge Plan or Barriers:   Reason for Continuation of Hospitalization: Delusions  Medication stabilization  Estimated Length of Stay: 5-7 days.  Attendees: Patient:Brad Moreno 03/25/2017 3:26 PM  Physician: Dr Nancy Fetter, MD 03/25/2017 3:26  PM  Nursing: Sena Hitch, RN 03/25/2017 3:26 PM  RN Care Manager: 03/25/2017 3:26 PM  Social Worker: Lurline Idol, LCSW 03/25/2017 3:26 PM  Recreational Therapist:  03/25/2017 3:26 PM  Other:  03/25/2017 3:26 PM  Other:  03/25/2017 3:26 PM  Other: 03/25/2017 3:26 PM        Scribe for Treatment Team: Joanne Chars, LCSW 03/25/2017 3:29 PM

## 2017-03-25 NOTE — Progress Notes (Signed)
D    Pt pacing the halls and making rude comments and gestures   Called this staff person a niggar when trying to give medications after patient had asked for them   He refused them then came back a little later and took his medications   His verbalizations are violent and vulgar    A   Verbal support given   Medications administered effectiveness monitored    Redirection as needed   Limit setting    Q 15 min checks R   Pt is safe at present time

## 2017-03-25 NOTE — Progress Notes (Signed)
Pt appears to be responding to VH/internal stimuli and is irritable. Pt is seen making big hand gestures/movements in proximity to peers. Pt is also seen dancing in hallway. PRN Zyprexa requested and given. Pt is restricted to unit this a.m. See orders.

## 2017-03-26 DIAGNOSIS — R451 Restlessness and agitation: Secondary | ICD-10-CM

## 2017-03-26 DIAGNOSIS — F319 Bipolar disorder, unspecified: Principal | ICD-10-CM

## 2017-03-26 DIAGNOSIS — F1290 Cannabis use, unspecified, uncomplicated: Secondary | ICD-10-CM

## 2017-03-26 DIAGNOSIS — F1099 Alcohol use, unspecified with unspecified alcohol-induced disorder: Secondary | ICD-10-CM

## 2017-03-26 MED ORDER — LORAZEPAM 1 MG PO TABS
1.0000 mg | ORAL_TABLET | Freq: Once | ORAL | Status: AC
  Administered 2017-03-26: 1 mg via ORAL
  Filled 2017-03-26: qty 1

## 2017-03-26 MED ORDER — LORAZEPAM 1 MG PO TABS
2.0000 mg | ORAL_TABLET | Freq: Once | ORAL | Status: AC
  Administered 2017-03-26: 2 mg via ORAL
  Filled 2017-03-26 (×2): qty 2

## 2017-03-26 NOTE — Progress Notes (Signed)
Adult Psychoeducational Group Note  Date:  03/26/2017 Time:  9:09 PM  Group Topic/Focus:  Wrap-Up Group:   The focus of this group is to help patients review their daily goal of treatment and discuss progress on daily workbooks.  Participation Level:  Active  Participation Quality:  Appropriate  Affect:  Appropriate  Cognitive:  Appropriate  Insight: Appropriate  Engagement in Group:  Engaged  Modes of Intervention:  Discussion  Additional Comments: [facility].The patient also said that he rates today a 5.  Octavio Mannshigpen, Elexia Friedt Lee 03/26/2017, 9:09 PM

## 2017-03-26 NOTE — Progress Notes (Signed)
Pt appears to be responding, pt confrontational. Pt continues to challenge why he is on unit restriction. Pt was given 2 mg Ativan per mar 1x per Barbara CowerJason NP.

## 2017-03-26 NOTE — Progress Notes (Signed)
[facility] MD Progress Note  03/26/2017 8:57 AM Brad Moreno  MRN:  [id number]   Subjective:  Brad Moreno seen irritable and agitated  regarding mediations. Reports he was told that he only need to take night medication and that the staff is trying to kill him. Reports " my lithium was to high that why I am here. States he is willing to take Abilify as ordered by MD. Patient can become irritable and aggiated easily however is redirectable. denies medication side effects.   Brad Moreno denies suicidal or homicidal ideations during this assessment. Patient seen walking in and out of group sessions. Brad Moreno reports a good appetite and states he is resting well. Support, encouragement and reassurance was provided.   Principal Problem: Bipolar disorder, unspecified (HCC) Diagnosis:   Patient Active Problem List   Diagnosis Date Noted  . Psychosis (HCC) [F29] 03/23/2017  . Alcohol use [Z78.9] 02/17/2017  . Bipolar disorder, unspecified (HCC) [F31.9] 06/09/2010   Total Time spent with patient: 30 minutes  Past Psychiatric History: see H&P  Past Medical History:  Past Medical History:  Diagnosis Date  . Bipolar 1 disorder (HCC)   . Depression 06/2010  . Schizophrenia (HCC)    History reviewed. No pertinent surgical history. Family History:  Family History  Problem Relation Age of Onset  . Depression Mother   . Diabetes Mother   . Cancer Paternal Grandfather 59       Colon Cancer  . Alcohol abuse Brother        Was Heavy drinker and smoker--has quit   Family Psychiatric  History: see H&P Social History:  Social History   Substance and Sexual Activity  Alcohol Use Yes  . Alcohol/week: 1.8 oz  . Types: 3 Cans of beer per week     Social History   Substance and Sexual Activity  Drug Use Yes  . Types: Marijuana    Social History   Socioeconomic History  . Marital status: Legally Separated    Spouse name: None  . Number of children: None  . Years of education: None  . Highest  education level: None  Social Needs  . Financial resource strain: None  . Food insecurity - worry: None  . Food insecurity - inability: None  . Transportation needs - medical: None  . Transportation needs - non-medical: None  Occupational History  . None  Tobacco Use  . Smoking status: Former Smoker    Packs/day: 0.50  . Smokeless tobacco: Never Used  Substance and Sexual Activity  . Alcohol use: Yes    Alcohol/week: 1.8 oz    Types: 3 Cans of beer per week  . Drug use: Yes    Types: Marijuana  . Sexual activity: No  Other Topics Concern  . None  Social History Narrative   Entered at PCP Office 07/2013:   On Disability   Has 2 children--2 girls--age 77 y/o and 2/ y/o   At home, it is him and the kids.   Says he is separated. Says he goes to gym 5 days per week. The children's mother will watch children while he goes to the gym.   Says he has the kids himself most of the time.    Says he wants to go back to school himself-- once his youngest child gets into school.    Additional Social History:                         Sleep: Fair  Appetite:  Fair  Current Medications: Current Facility-Administered Medications  Medication Dose Route Frequency Provider Last Rate Last Dose  . acetaminophen (TYLENOL) tablet 650 mg  650 mg Oral Q6H PRN Micheal Likensainville, Christopher T, MD      . alum & mag hydroxide-simeth (MAALOX/MYLANTA) 200-200-20 MG/5ML suspension 15 mL  15 mL Oral Q4H PRN Micheal Likensainville, Christopher T, MD      . ARIPiprazole (ABILIFY) tablet 15 mg  15 mg Oral Daily [place], Christopher T, MD      . divalproex (DEPAKOTE) DR tablet 500 mg  500 mg Oral Q12H Micheal Likensainville, Christopher T, MD   500 mg at 03/26/17 0653  . hydrOXYzine (ATARAX/VISTARIL) tablet 50 mg  50 mg Oral Q6H PRN Micheal Likensainville, Christopher T, MD   50 mg at 03/26/17 0455  . magnesium hydroxide (MILK OF MAGNESIA) suspension 15 mL  15 mL Oral Daily PRN Micheal Likensainville, Christopher T, MD   15 mL at 03/25/17 1631  . nicotine  polacrilex (NICORETTE) gum 2 mg  2 mg Oral PRN Micheal Likensainville, Christopher T, MD   2 mg at 03/25/17 1122  . OLANZapine zydis (ZYPREXA) disintegrating tablet 10 mg  10 mg Oral Q8H PRN Micheal Likensainville, Christopher T, MD   10 mg at 03/25/17 [id number]   And  . ziprasidone (GEODON) injection 20 mg  20 mg Intramuscular PRN Micheal Likensainville, Christopher T, MD        Lab Results: No results found for this or any previous visit (from the past 48 hour(s)).  Blood Alcohol level:  Lab Results  Component Value Date   ETH <10 03/22/2017   ETH <10 03/03/2017    Metabolic Disorder Labs: No results found for: HGBA1C, MPG No results found for: PROLACTIN Lab Results  Component Value Date   CHOL 176 02/17/2017   TRIG 153 (H) 02/17/2017   HDL 66 02/17/2017   CHOLHDL 2.7 02/17/2017   VLDL 19 09/14/2013   LDLCALC 96 09/14/2013    Physical Findings: AIMS: Facial and Oral Movements Muscles of Facial Expression: None, normal Lips and Perioral Area: None, normal Jaw: None, normal Tongue: None, normal,Extremity Movements Upper (arms, wrists, hands, fingers): None, normal Lower (legs, knees, ankles, toes): None, normal, Trunk Movements Neck, shoulders, hips: None, normal, Overall Severity Severity of abnormal movements (highest score from questions above): None, normal Incapacitation due to abnormal movements: None, normal Patient's awareness of abnormal movements (rate only patient's report): No Awareness, Dental Status Current problems with teeth and/or dentures?: No Does patient usually wear dentures?: No  CIWA:    COWS:     Musculoskeletal: Strength & Muscle Tone: within normal limits Gait & Station: normal Patient leans: N/A  Psychiatric Specialty Exam: Physical Exam  Nursing note and vitals reviewed. Constitutional: He appears well-developed.  Neurological: He is alert.  Psychiatric: He has a normal mood and affect.    Review of Systems  Psychiatric/Behavioral: Negative for depression, hallucinations and  suicidal ideas. The patient is nervous/anxious.   All other systems reviewed and are negative.   Blood pressure 120/80, pulse (!) 107, temperature 98.6 F (37 C), temperature source Oral, resp. rate 20, height 5' 8.75" (1.746 m), weight 95.3 kg (210 lb).Body mass index is 31.24 kg/m.  General Appearance: Casual and Disheveled  Eye Contact:  Fair  Speech:  Clear and Coherent and Normal Rate  Volume:  Normal  Mood:  Anxious, Dysphoric and Irritable  Affect:  Blunt, Inappropriate and Labile  Thought Process:  Coherent, Goal Directed and Descriptions of Associations: Loose  Orientation:  Full (Time, Place, and  Person)  Thought Content:  Delusions, Ideas of Reference:   Paranoia Delusions, Paranoid Ideation and Rumination  Suicidal Thoughts:  No  Homicidal Thoughts:  No  Memory:  Immediate;   Fair Recent;   Fair Remote;   Fair  Judgement:  Poor  Insight:  Lacking  Psychomotor Activity:  Increased  Concentration:  Concentration: Poor  Recall:  Poor  Fund of Knowledge:  Poor  Language:  Poor  Akathisia:  No  Handed:    AIMS (if indicated):     Assets:  Resilience  ADL's:  Intact  Cognition:  WNL  Sleep:  Number of Hours: 1.75    Treatment Plan Summary: Daily contact with patient to assess and evaluate symptoms and progress in treatment and Medication management..   - Continue with current treatment plan on 03/26/2017 except where noted  - Bipolar unspecified             - Continue depakote DR 500mg  po BID             - Continue  abilify 15mg  po qDay  -Anxiety   - Continue atarax 50mg  po q6h prn anxiety  -Agitation   - Continue protocol with zydis/ativan/geodon PRN agitation  - Encourage participation in groups and the therapeutic milieu -Discharge planning will be ongoing  Oneta Rack, NP 03/26/2017, 8:57 AM

## 2017-03-26 NOTE — [facility] Group Notes (Signed)
LCSW Group Therapy Note  03/26/2017 9:30-10:30AM - 300 Hall, 10:30-11:30 - 400 Hall, 11:30-12:00 - 500 Hall  Type of Therapy and Topic:  Group Therapy: Anger Cues and Responses  Participation Level:  Active   Description of Group:   In this group, patients learned how to recognize the physical, cognitive, emotional, and behavioral responses they have to anger-provoking situations.  They identified a recent time they became angry and how they reacted.  They analyzed how their reaction was possibly beneficial and how it was possibly unhelpful.  The group discussed a variety of healthier coping skills that could help with such a situation in the future.  Deep breathing was practiced briefly.  Therapeutic Goals: 1. Patients will remember their last incident of anger and how they felt emotionally and physically, what their thoughts were at the time, and how they behaved. 2. Patients will identify how their behavior at that time worked for them, as well as how it worked against them. 3. Patients will explore possible new behaviors to use in future anger situations. 4. Patients will learn that anger itself is normal and cannot be eliminated, and that healthier reactions can assist with resolving conflict rather than worsening situations.  Summary of Patient Progress:  The patient shared that his most recent time of anger was today and he spent most of the group time talking about not being able to go to the cafeteria because of being on restriction to the unit, feeling this is very unfair and he is going to lose weight because he cannot get enough to eat.  He said he has not had dessert since being restricted to the unit.  He made threatening statements such as "Today is the day for bloodshed.  Somebody's going to get hurt today."  Later he had left the room and came back calmer, was able to acknowledge that such statements are not helpful to getting him more freedoms while in the hospital.  CSW  role-played with him threatening posture and threatening words and he was able to tell the difference, was calm and pleasant throughout the process.  However, as soon as group was over he exited and started yelling and cursing at staff in the hall, particularly at a nurse with an accent, calling her and CSW names such as "bitc-" and specifically being rude to the nurse about her accent and claiming he could not understand while not listening to her at all.  He once again verbalized threats such as "I'm going to bust some heads up in here.".  All efforts by CSW to deescalate him were in vain. CSW left the unit to avoid further escalating him.  Therapeutic Modalities:   Cognitive Behavioral Therapy  Lynnell ChadMareida J Grossman-Orr  03/26/2017 8:36 AM

## 2017-03-26 NOTE — [facility] Group Notes (Signed)
Date:  03/26/2017  Time:  7:30 PM  Type of Therapy:  Psychoeducational Skills  Participation Level:  Active  Participation Quality:  Appropriate  Affect:  Appropriate  Cognitive:  Appropriate  Insight:  Appropriate  Engagement in Group:  Engaged  Modes of Intervention:  Problem-solving  Summary of Progress/Problems: Making goals  Brad Moreno 03/26/2017, 7:30 PM  

## 2017-03-26 NOTE — Progress Notes (Signed)
Pt pacing the hall, pt trying to intimidate staff and peers. Pt seen walking behind people jumping at them. Pt has poor boundaries , pt presents with postured affect.

## 2017-03-26 NOTE — Progress Notes (Signed)
DAR NOTE: Patient presents with anxious affect and labile mood. Pt became very irritable and verbally abusive towards the staff during med pass because pt did not want to take his medications. Pt also became very aggressive during lunch demanding for two trays, pt called staff names and threaten to beat up the staff unless he gets two trays. Pt has been pacing on the hall way rapping. Denies pain, auditory and visual hallucinations.  Rates depression at 3, hopelessness at 3, and anxiety at 6.  Maintained on routine safety checks.  Medications given as prescribed.  Support and encouragement offered as needed.  Attended group and participated.  States goal for today is " being humble." Patient observed socializing with peers in the dayroom, will continue to monitor.

## 2017-03-26 NOTE — Plan of Care (Signed)
  Safety: Periods of time without injury will increase 03/26/2017 2147 - Progressing by Delos HaringPhillips, Hamed Debella A, RN Note Pt safe on the unit at this time

## 2017-03-26 NOTE — Progress Notes (Signed)
Pt up most of the night pacing the halls  Acting out parts of a play   Making wide gestures with his hands but non threatening and easily redirected  Pt said the ativan made him feel calmer   His thought processes are disorganized

## 2017-03-26 NOTE — Progress Notes (Signed)
"  I'm still on punishment". Pt was informed he was on unit restriction for his behavior . Pt continues to be verbally aggressive and has no insight into his Tx. Pt continues to Animal nutritionistposture/ threaten staff. Pt got upset when he was told to leave the dayroom at 2300

## 2017-03-26 NOTE — Progress Notes (Signed)
D: Pt denies SI/HI/AVH. Pt is labile, irritable, confrontational and delusional. "I don't need to get harassed when I'm taking my medications" , pt did not want to answer questions when asked this evening.   A: Pt was offered support and encouragement. Pt was given scheduled medications. Pt was encourage to attend groups. Q 15 minute checks were done for safety.   R: Pt is taking medication. Pt receptive to treatment and safety maintained on unit.

## 2017-03-27 MED ORDER — LORAZEPAM 1 MG PO TABS
2.0000 mg | ORAL_TABLET | Freq: Once | ORAL | Status: AC
  Administered 2017-03-27: 2 mg via ORAL
  Filled 2017-03-27: qty 2

## 2017-03-27 MED ORDER — ZIPRASIDONE MESYLATE 20 MG IM SOLR
20.0000 mg | Freq: Once | INTRAMUSCULAR | Status: DC
  Filled 2017-03-27 (×3): qty 20

## 2017-03-27 MED ORDER — LORAZEPAM 1 MG PO TABS: 1.0000 mg | ORAL_TABLET | Freq: Once | ORAL | Status: DC

## 2017-03-27 MED ORDER — BENZONATATE 100 MG PO CAPS
100.0000 mg | ORAL_CAPSULE | Freq: Three times a day (TID) | ORAL | Status: DC | PRN
  Administered 2017-03-27 – 2017-03-31 (×9): 100 mg via ORAL
  Filled 2017-03-27 (×9): qty 1

## 2017-03-27 NOTE — [facility] Group Notes (Signed)
Adult Psychoeducational Group Note  Date:  03/27/2017 Time:  8:48 AM  Group Topic/Focus:  Goals Group:   The focus of this group is to help patients establish daily goals to achieve during treatment and discuss how the patient can incorporate goal setting into their daily lives to aide in recovery.  Participation Level:  Minimal  Participation Quality:  Appropriate  Affect:  Appropriate  Cognitive:  Disorganized  Insight: Limited  Engagement in Group:  Limited  Modes of Intervention:  Orientation  Additional Comments:  Pt didn't have a goal for today.  Dellia NimsJaquesha M Bernhard Koskinen 03/27/2017, 8:48 AM

## 2017-03-27 NOTE — [facility] Group Notes (Signed)
[facility] Group Notes:  (Nursing/MHT/Case Management/Adjunct)  Date:  03/27/2017  Time:  4:00 p.m.  Type of Therapy:  Psychoeducational Skills  Participation Level:  Active  Participation Quality:  Appropriate  Affect:  Appropriate  Cognitive:  Appropriate  Insight:  Appropriate  Engagement in Group:  Engaged  Modes of Intervention:  Education  Summary of Progress/Problems:    Patient involved and active in group.            Earline MayotteKnight, Deyon Chizek Shephard 03/27/2017, 5:48 PM

## 2017-03-27 NOTE — Plan of Care (Signed)
Nurse discussed coping skills with patient.  

## 2017-03-27 NOTE — [facility] Group Notes (Signed)
Adult Psychoeducational Group Note  Date:  03/27/2017 Time:  7:44 PM  Group Topic/Focus:  Healthy Communication:   The focus of this group is to discuss communication, barriers to communication, as well as healthy ways to communicate with others.  Participation Level:  Minimal  Participation Quality:  Intrusive  Affect:  Irritable  Cognitive:  Disorganized  Insight: Lacking  Engagement in Group:  Off Topic  Modes of Intervention:  Discussion  Additional Comments:  Pt attend group but kept talking over people. Pt was not respectful at all to other peers.  Pt kept walking in and out of group. Pt was told repeatedly to respect other peers. Pt then left out of group and got upset because he couldn't return to group.   Dellia NimsJaquesha M Kalev Temme 03/27/2017, 7:44 PM

## 2017-03-27 NOTE — Progress Notes (Signed)
Patient's geodon IM was prepared and then patient refused.  Patient was given ativan 2 mg p.o. Per NP orders.

## 2017-03-27 NOTE — [facility] Group Notes (Signed)
[facility]  Date/Time:  03/27/2017  11:00AM-12:00PM  Type of Therapy and Topic:  Group Therapy:  Music and Mood  Participation Level:  Active   Description of Group: In this process group, members listened to a variety of genres of music and identified that different types of music evoke different responses.  Patients were encouraged to identify music that was soothing for them and music that was energizing for them.  Patients discussed how this knowledge can help with wellness and recovery in various ways including managing depression and anxiety as well as encouraging healthy sleep habits.    Therapeutic Goals: 1. Patients will explore the impact of different varieties of music on mood 2. Patients will verbalize the thoughts they have when listening to different types of music 3. Patients will identify music that is soothing to them as well as music that is energizing to them 4. Patients will discuss how to use this knowledge to assist in maintaining wellness and recovery 5. Patients will explore the use of music as a coping skill  Summary of Patient Progress:  At the beginning of group, patient expressed feeling "great."  He was in and out of the room, stated he enjoyed the music whenever present, singing along.  Therapeutic Modalities: Solution Focused Brief Therapy Activity   Ambrose MantleMareida Grossman-Orr, LCSW

## 2017-03-27 NOTE — Progress Notes (Addendum)
D:  Patient's self inventory sheet, patient sleeps good, sleep medication helpful.  Good appetite, hyper energy level, poor concentration.  Denied depression, anxiety, hopeless. dened SI.  Denied physical problems.  Does have headaches, worst pain #3 in past 24 hours.  No discharge plans.  At discharge, his dad will pick up the patient. A:  Medications administered per MD orders.  Emotional support and encouragement given patient.  R:  Patient denied SI and HI, contracts for safety.  Denied A/V hallucinations.  Safety maintained with 15 minute checks.i

## 2017-03-27 NOTE — Progress Notes (Signed)
D   Pt is cooperative and clam   He is easliy redirected   He continues to have some mild irritability and mood swings  A   Verbal support given  Medications administered and effectiveness monitored Q 15 min checks R   Pt is safe at present time

## 2017-03-27 NOTE — Progress Notes (Signed)
Pt up pacing the hall needing constant re-direction . Pt speaking bizarre, loose associations , flight of ideas.

## 2017-03-27 NOTE — Plan of Care (Signed)
Nurse discussed depression, anxiety, coping skills with patient.  

## 2017-03-27 NOTE — [facility] Group Notes (Signed)
[facility] Group Notes:  (Nursing/MHT/Case Management/Adjunct)  Date:  03/27/2017  Time:  9:30 a.m.  Type of Therapy:  Psychoeducational Skills  Participation Level:  Active  Participation Quality:  Appropriate  Affect:  Appropriate  Cognitive:  Appropriate  Insight:  Appropriate  Engagement in Group:  Engaged  Modes of Intervention:  Education  Summary of Progress/Problems: Discussed healthy support system and team building after discharge.    Earline MayotteKnight, Brad Moreno 03/27/2017, 10:32 AM

## 2017-03-27 NOTE — Progress Notes (Signed)
Pt has been very labile on the unit , pacing, talking verbally aggressive, pt performing karate moves. Pt was convinced to take 20 mg Geodon IM to help calm him down.

## 2017-03-27 NOTE — Progress Notes (Signed)
[facility] MD Progress Note  03/27/2017 3:26 PM Brad Moreno  MRN:  [id number]   Subjective: Brad Moreno seen pacing the unit. Patient present with irritability and is tangential this morning. Patient is rumination with W2 and working out side of the hospital." - Reported that patient hasn't slept  last night.  Agitation protocol was given at 0500 am.   Brad Moreno get irritable when is time to take medications. Brad Moreno needs a encouragement  and support when taken medications. Reports " they always trying to make me take medications that are not for me."  Brad Moreno continues to be sexually inappropriate and needs constant  Redirection.  Brad Moreno denies suicidal or homicidal ideations during this assessment. Patient seen walking in and out of group sessions. Brad Moreno reports a good appetite and is requesting to be taken off unit restriction. Patient was escorted to cafeteria however he patient remains on unit restriction.  Support, encouragement and reassurance was provided.   Principal Problem: Bipolar disorder, unspecified (HCC) Diagnosis:   Patient Active Problem List   Diagnosis Date Noted  . Psychosis (HCC) [F29] 03/23/2017  . Alcohol use [Z78.9] 02/17/2017  . Bipolar disorder, unspecified (HCC) [F31.9] 06/09/2010   Total Time spent with patient: 30 minutes  Past Psychiatric History: see H&P  Past Medical History:  Past Medical History:  Diagnosis Date  . Bipolar 1 disorder (HCC)   . Depression 06/2010  . Schizophrenia (HCC)    History reviewed. No pertinent surgical history. Family History:  Family History  Problem Relation Age of Onset  . Depression Mother   . Diabetes Mother   . Cancer Paternal Grandfather 72       Colon Cancer  . Alcohol abuse Brother        Was Heavy drinker and smoker--has quit   Family Psychiatric  History: see H&P Social History:  Social History   Substance and Sexual Activity  Alcohol Use Yes  . Alcohol/week: 1.8 oz  . Types: 3 Cans of beer per  week     Social History   Substance and Sexual Activity  Drug Use Yes  . Types: Marijuana    Social History   Socioeconomic History  . Marital status: Legally Separated    Spouse name: None  . Number of children: None  . Years of education: None  . Highest education level: None  Social Needs  . Financial resource strain: None  . Food insecurity - worry: None  . Food insecurity - inability: None  . Transportation needs - medical: None  . Transportation needs - non-medical: None  Occupational History  . None  Tobacco Use  . Smoking status: Former Smoker    Packs/day: 0.50  . Smokeless tobacco: Never Used  Substance and Sexual Activity  . Alcohol use: Yes    Alcohol/week: 1.8 oz    Types: 3 Cans of beer per week  . Drug use: Yes    Types: Marijuana  . Sexual activity: No  Other Topics Concern  . None  Social History Narrative   Entered at PCP Office 07/2013:   On Disability   Has 2 children--2 girls--age 56 y/o and 2/ y/o   At home, it is him and the kids.   Says he is separated. Says he goes to gym 5 days per week. The children's mother will watch children while he goes to the gym.   Says he has the kids himself most of the time.    Says he wants to go back to school himself-- once  his youngest child gets into school.    Additional Social History:                         Sleep: Fair  Appetite:  Fair  Current Medications: Current Facility-Administered Medications  Medication Dose Route Frequency Provider Last Rate Last Dose  . acetaminophen (TYLENOL) tablet 650 mg  650 mg Oral Q6H PRN Micheal Likensainville, Christopher T, MD   650 mg at 03/27/17 1128  . alum & mag hydroxide-simeth (MAALOX/MYLANTA) 200-200-20 MG/5ML suspension 15 mL  15 mL Oral Q4H PRN Jolyne Loaainville, Christopher T, MD      . ARIPiprazole (ABILIFY) tablet 15 mg  15 mg Oral Daily Micheal Likensainville, Christopher T, MD   15 mg at 03/27/17 0810  . benzonatate (TESSALON) capsule 100 mg  100 mg Oral TID PRN Oneta RackLewis,  Dayle Sherpa N, NP   100 mg at 03/27/17 1454  . divalproex (DEPAKOTE) DR tablet 500 mg  500 mg Oral Q12H Micheal Likensainville, Christopher T, MD   500 mg at 03/27/17 0810  . hydrOXYzine (ATARAX/VISTARIL) tablet 50 mg  50 mg Oral Q6H PRN Micheal Likensainville, Christopher T, MD   50 mg at 03/26/17 0455  . magnesium hydroxide (MILK OF MAGNESIA) suspension 15 mL  15 mL Oral Daily PRN Micheal Likensainville, Christopher T, MD   15 mL at 03/25/17 1631  . nicotine polacrilex (NICORETTE) gum 2 mg  2 mg Oral PRN Micheal Likensainville, Christopher T, MD   2 mg at 03/27/17 0841  . OLANZapine zydis (ZYPREXA) disintegrating tablet 10 mg  10 mg Oral Q8H PRN Micheal Likensainville, Christopher T, MD   10 mg at 03/25/17 [id number]    Lab Results: No results found for this or any previous visit (from the past 48 hour(s)).  Blood Alcohol level:  Lab Results  Component Value Date   ETH <10 03/22/2017   ETH <10 03/03/2017    Metabolic Disorder Labs: No results found for: HGBA1C, MPG No results found for: PROLACTIN Lab Results  Component Value Date   CHOL 176 02/17/2017   TRIG 153 (H) 02/17/2017   HDL 66 02/17/2017   CHOLHDL 2.7 02/17/2017   VLDL 19 09/14/2013   LDLCALC 96 09/14/2013    Physical Findings: AIMS: Facial and Oral Movements Muscles of Facial Expression: None, normal Lips and Perioral Area: None, normal Jaw: None, normal Tongue: None, normal,Extremity Movements Upper (arms, wrists, hands, fingers): None, normal Lower (legs, knees, ankles, toes): None, normal, Trunk Movements Neck, shoulders, hips: None, normal, Overall Severity Severity of abnormal movements (highest score from questions above): None, normal Incapacitation due to abnormal movements: None, normal Patient's awareness of abnormal movements (rate only patient's report): No Awareness, Dental Status Current problems with teeth and/or dentures?: No Does patient usually wear dentures?: No  CIWA:  CIWA-Ar Total: 1 COWS:  COWS Total Score: 3  Musculoskeletal: Strength & Muscle Tone: within  normal limits Gait & Station: normal Patient leans: N/A  Psychiatric Specialty Exam: Physical Exam  Nursing note and vitals reviewed. Constitutional: He appears well-developed and well-nourished.  Neurological: He is alert.  Psychiatric: He has a normal mood and affect.    Review of Systems  Psychiatric/Behavioral: Negative for depression, hallucinations and suicidal ideas. The patient is nervous/anxious.   All other systems reviewed and are negative.   Blood pressure 132/67, pulse (!) 101, temperature 98.4 F (36.9 C), temperature source Oral, resp. rate 20, height 5' 8.75" (1.746 m), weight 95.3 kg (210 lb).Body mass index is 31.24 kg/m.  General Appearance: Disheveled and Guarded  Eye Contact:  Fair  Speech:  Clear and Coherent and Normal Rate  Volume:  Normal  Mood:  Anxious, Dysphoric and Irritable  Affect:  Blunt, Inappropriate and Labile- sexually inappropriate and needs constant redirection   Thought Process:  Coherent, Goal Directed and Descriptions of Associations: Loose  Orientation:  Full (Time, Place, and Person)  Thought Content:  Delusions, Ideas of Reference:   Paranoia Delusions, Paranoid Ideation and Rumination  Suicidal Thoughts:  No  Homicidal Thoughts:  No  Memory:  Immediate;   Fair Recent;   Fair Remote;   Fair  Judgement:  Poor  Insight:  Lacking  Psychomotor Activity:  Increased  Concentration:  Concentration: Poor  Recall:  Poor  Fund of Knowledge:  Poor  Language:  Poor  Akathisia:  No  Handed:    AIMS (if indicated):     Assets:  Resilience  ADL's:  Intact  Cognition:  WNL  Sleep:  Number of Hours: 1.75    Treatment Plan Summary: Daily contact with patient to assess and evaluate symptoms and progress in treatment and Medication management.  - Continue with current treatment plan on 03/27/2017 except where noted  - Bipolar unspecified             - Continue depakote DR 500 mg po BID             - Continue abilify 15mg  po  qDay  -Anxiety   - Continue atarax 50mg  po q6h prn anxiety  -Agitation   - Continue protocol with zydis/ativan/geodon PRN agitation  - Encourage participation in groups and the therapeutic milieu -Discharge planning will be ongoing  Oneta Rack, NP 03/27/2017, 3:26 PM

## 2017-03-28 LAB — VALPROIC ACID LEVEL: VALPROIC ACID LVL: 53 ug/mL (ref 50.0–100.0)

## 2017-03-28 MED ORDER — LORAZEPAM 1 MG PO TABS
2.0000 mg | ORAL_TABLET | Freq: Once | ORAL | Status: AC
  Administered 2017-03-28: 2 mg via ORAL

## 2017-03-28 MED ORDER — ARIPIPRAZOLE 10 MG PO TABS
20.0000 mg | ORAL_TABLET | Freq: Every day | ORAL | Status: DC
  Administered 2017-03-29 – 2017-03-30 (×2): 20 mg via ORAL
  Filled 2017-03-28 (×2): qty 2
  Filled 2017-03-28: qty 14
  Filled 2017-03-28: qty 2

## 2017-03-28 MED ORDER — ZIPRASIDONE MESYLATE 20 MG IM SOLR
20.0000 mg | INTRAMUSCULAR | Status: AC | PRN
  Administered 2017-03-29: 20 mg via INTRAMUSCULAR

## 2017-03-28 MED ORDER — OLANZAPINE 10 MG PO TBDP
10.0000 mg | ORAL_TABLET | Freq: Three times a day (TID) | ORAL | Status: DC | PRN
  Administered 2017-03-28 – 2017-03-29 (×2): 10 mg via ORAL
  Filled 2017-03-28 (×2): qty 1

## 2017-03-28 MED ORDER — LORAZEPAM 1 MG PO TABS
ORAL_TABLET | ORAL | Status: AC
  Filled 2017-03-28: qty 2

## 2017-03-28 MED ORDER — GUAIFENESIN ER 600 MG PO TB12: 600.0000 mg | ORAL_TABLET | Freq: Two times a day (BID) | ORAL | Status: DC | PRN

## 2017-03-28 MED ORDER — OLANZAPINE 10 MG PO TBDP
10.0000 mg | ORAL_TABLET | Freq: Once | ORAL | Status: AC
  Administered 2017-03-28: 10 mg via ORAL
  Filled 2017-03-28 (×2): qty 1

## 2017-03-28 MED ORDER — GUAIFENESIN ER 600 MG PO TB12
600.0000 mg | ORAL_TABLET | Freq: Two times a day (BID) | ORAL | Status: DC | PRN
  Administered 2017-03-28 – 2017-03-31 (×6): 600 mg via ORAL
  Filled 2017-03-28 (×5): qty 1

## 2017-03-28 MED ORDER — LORAZEPAM 1 MG PO TABS
1.0000 mg | ORAL_TABLET | ORAL | Status: AC | PRN
  Administered 2017-03-28: 1 mg via ORAL
  Filled 2017-03-28: qty 1

## 2017-03-28 MED ORDER — DIVALPROEX SODIUM 250 MG PO DR TAB
500.0000 mg | DELAYED_RELEASE_TABLET | ORAL | Status: DC
  Administered 2017-03-29 – 2017-03-31 (×3): 500 mg via ORAL
  Filled 2017-03-28: qty 35
  Filled 2017-03-28 (×4): qty 1

## 2017-03-28 MED ORDER — DIVALPROEX SODIUM 500 MG PO DR TAB
750.0000 mg | DELAYED_RELEASE_TABLET | Freq: Every day | ORAL | Status: DC
  Administered 2017-03-28 – 2017-03-30 (×3): 750 mg via ORAL
  Filled 2017-03-28 (×5): qty 1

## 2017-03-28 NOTE — Progress Notes (Signed)
[facility] MD Progress Note  03/28/2017 1:58 PM Brad Moreno  MRN:  [id number] Subjective:    Brad Moreno is a 37 y/o M with reported history of schizophrenia and bipolar disorder who was admitted on IVC placed by pt's wife with worsening symptoms of agitation and disorganization. Pt had been talking to himself and had non-goal directed activities of placing objects in the driveway and pouring trash all over the yard. 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 On initial presentation agreed to be changed from lithium to Depakote and agreed to trial of abilify. Pt had some ongoing agitation and disorganized behaviors such as making multiple inappropriate gestures to attempt to provoke peers into escalating conflict, yelling and talking to himself loudly. He was also pacing on the unit, dancing, and hopping/jumping - causing disturbance to other patients.   As per RN report, pt had ongoing episodic agitation and disorganized behaviors. This AM, he was posturing and threatening to RN staff about killing them, but he was able to be redirected.  Upon interview today, pt shares, "I'm good but they keep trying to give me medications all the time I don't need." Pt was asked for clarification, and he notes that he typically takes abilify at bedtime, but he is being given it in the morning. Pt denies SI/HI/AH/VH. With further questioning, pt grew increasingly irritated, and he began to curse with pressured speech and a string of vulgar comments. Pt was asked about threats to RN staff, and then he immediately began to direct vaguely threatening statements to this provider as he grew increasingly agitated. Interview was concluded due to patient's behavior.   As per pt's request we will change abilify to bedtime, and we will increase dose to address his agitation/aggressive behaviors. Pt had depakote level of 53 this AM, so will increase his evening dose of depakote by 250mg  as  well.   Principal Problem: Bipolar disorder, unspecified (HCC) Diagnosis:   Patient Active Problem List   Diagnosis Date Noted  . Psychosis (HCC) [F29] 03/23/2017  . Alcohol use [Z78.9] 02/17/2017  . Bipolar disorder, unspecified (HCC) [F31.9] 06/09/2010   Total Time spent with patient: 30 minutes  Past Psychiatric History: see H&P  Past Medical History:  Past Medical History:  Diagnosis Date  . Bipolar 1 disorder (HCC)   . Depression 06/2010  . Schizophrenia (HCC)    History reviewed. No pertinent surgical history. Family History:  Family History  Problem Relation Age of Onset  . Depression Mother   . Diabetes Mother   . Cancer Paternal Grandfather 50       Colon Cancer  . Alcohol abuse Brother        Was Heavy drinker and smoker--has quit   Family Psychiatric  History: see H&P Social History:  Social History   Substance and Sexual Activity  Alcohol Use Yes  . Alcohol/week: 1.8 oz  . Types: 3 Cans of beer per week     Social History   Substance and Sexual Activity  Drug Use Yes  . Types: Marijuana    Social History   Socioeconomic History  . Marital status: Legally Separated    Spouse name: None  . Number of children: None  . Years of education: None  . Highest education level: None  Social Needs  . Financial resource strain: None  . Food insecurity - worry: None  . Food insecurity - inability: None  . Transportation needs - medical: None  . Transportation needs -  non-medical: None  Occupational History  . None  Tobacco Use  . Smoking status: Former Smoker    Packs/day: 0.50  . Smokeless tobacco: Never Used  Substance and Sexual Activity  . Alcohol use: Yes    Alcohol/week: 1.8 oz    Types: 3 Cans of beer per week  . Drug use: Yes    Types: Marijuana  . Sexual activity: No  Other Topics Concern  . None  Social History Narrative   Entered at PCP Office 07/2013:   On Disability   Has 2 children--2 girls--age 64 y/o and 2/ y/o   At home, it  is him and the kids.   Says he is separated. Says he goes to gym 5 days per week. The children's mother will watch children while he goes to the gym.   Says he has the kids himself most of the time.    Says he wants to go back to school himself-- once his youngest child gets into school.    Additional Social History:                         Sleep: Fair  Appetite:  Fair  Current Medications: Current Facility-Administered Medications  Medication Dose Route Frequency Provider Last Rate Last Dose  . acetaminophen (TYLENOL) tablet 650 mg  650 mg Oral Q6H PRN Micheal Likens, MD   325 mg at 03/28/17 [phone]  . alum & mag hydroxide-simeth (MAALOX/MYLANTA) 200-200-20 MG/5ML suspension 15 mL  15 mL Oral Q4H PRN Micheal Likens, MD      . ARIPiprazole (ABILIFY) tablet 15 mg  15 mg Oral Daily Micheal Likens, MD   15 mg at 03/28/17 0742  . benzonatate (TESSALON) capsule 100 mg  100 mg Oral TID PRN Oneta Rack, NP   100 mg at 03/28/17 1354  . divalproex (DEPAKOTE) DR tablet 500 mg  500 mg Oral Q12H Micheal Likens, MD   500 mg at 03/28/17 9604  . hydrOXYzine (ATARAX/VISTARIL) tablet 50 mg  50 mg Oral Q6H PRN Micheal Likens, MD   50 mg at 03/27/17 2105  . OLANZapine zydis (ZYPREXA) disintegrating tablet 10 mg  10 mg Oral Q8H PRN Micheal Likens, MD       And  . LORazepam (ATIVAN) tablet 1 mg  1 mg Oral PRN Micheal Likens, MD       And  . ziprasidone (GEODON) injection 20 mg  20 mg Intramuscular PRN Micheal Likens, MD      . magnesium hydroxide (MILK OF MAGNESIA) suspension 15 mL  15 mL Oral Daily PRN Micheal Likens, MD   15 mL at 03/25/17 1631  . nicotine polacrilex (NICORETTE) gum 2 mg  2 mg Oral PRN Micheal Likens, MD   2 mg at 03/27/17 2015  . ziprasidone (GEODON) injection 20 mg  20 mg Intramuscular Once Oneta Rack, NP        Lab Results:  Results for orders placed or performed during  the hospital encounter of 03/23/17 (from the past 48 hour(s))  Valproic acid level     Status: None   Collection Time: 03/28/17  6:33 AM  Result Value Ref Range   Valproic Acid Lvl 53 50.0 - 100.0 ug/mL    Comment: Performed at [facility], [address], [place], [place] [address]    Blood Alcohol level:  Lab Results  Component Value Date   ETH <10 03/22/2017  ETH <10 03/03/2017    Metabolic Disorder Labs: No results found for: HGBA1C, MPG No results found for: PROLACTIN Lab Results  Component Value Date   CHOL 176 02/17/2017   TRIG 153 (H) 02/17/2017   HDL 66 02/17/2017   CHOLHDL 2.7 02/17/2017   VLDL 19 09/14/2013   LDLCALC 96 09/14/2013    Physical Findings: AIMS: Facial and Oral Movements Muscles of Facial Expression: None, normal Lips and Perioral Area: None, normal Jaw: None, normal Tongue: None, normal,Extremity Movements Upper (arms, wrists, hands, fingers): None, normal Lower (legs, knees, ankles, toes): None, normal, Trunk Movements Neck, shoulders, hips: None, normal, Overall Severity Severity of abnormal movements (highest score from questions above): None, normal Incapacitation due to abnormal movements: None, normal Patient's awareness of abnormal movements (rate only patient's report): No Awareness, Dental Status Current problems with teeth and/or dentures?: No Does patient usually wear dentures?: No  CIWA:  CIWA-Ar Total: 1 COWS:  COWS Total Score: 3  Musculoskeletal: Strength & Muscle Tone: within normal limits Gait & Station: normal Patient leans: N/A  Psychiatric Specialty Exam: Physical Exam  Nursing note and vitals reviewed.   Review of Systems  Constitutional: Negative for chills and fever.  Respiratory: Negative for cough.   Cardiovascular: Negative for chest pain.  Gastrointestinal: Negative for abdominal pain, heartburn, nausea and vomiting.  Psychiatric/Behavioral: Negative for depression, hallucinations and  suicidal ideas. The patient is not nervous/anxious.     Blood pressure 140/77, pulse 100, temperature 98.6 F (37 C), temperature source Oral, resp. rate 16, height 5' 8.75" (1.746 m), weight 95.3 kg (210 lb).Body mass index is 31.24 kg/m.  General Appearance: Casual and Fairly Groomed  Eye Contact:  Fair  Speech:  Clear and Coherent and Pressured  Volume:  Normal  Mood:  Dysphoric and Irritable  Affect:  Blunt  Thought Process:  Goal Directed and Descriptions of Associations: Loose  Orientation:  Full (Time, Place, and Person)  Thought Content:  Delusions, Ideas of Reference:   Paranoia Delusions and Paranoid Ideation  Suicidal Thoughts:  No  Homicidal Thoughts:  No  Memory:  Immediate;   Fair Recent;   Fair Remote;   Fair  Judgement:  Poor  Insight:  Lacking  Psychomotor Activity:  Normal  Concentration:  Concentration: Poor  Recall:  Poor  Fund of Knowledge:  Poor  Language:  Fair  Akathisia:  No  Handed:    AIMS (if indicated):     Assets:  Physical Health Resilience Social Support  ADL's:  Intact  Cognition:  WNL  Sleep:  Number of Hours: 3.5    Treatment Plan Summary: Daily contact with patient to assess and evaluate symptoms and progress in treatment and Medication management. Pt has ongoing episodic agitation and disorganization, and he has threatened nursing and medical staff today. We will increase dose of abilify and change to bedtime as per pt's request. We will increase depakote to 750mg  at bedtime and continue AM dose without change.  - Continue with current treatment plan on 03/26/2017 except where noted  - Bipolar unspecified - Change depakote DR 500mg  po BID to depakote DR 500mg  po qAM + 750mg  po qhs - Change  abilify 15mg  po qDay to abilify 20mg  po qhs  -Anxiety             - Continue atarax 50mg  po q6h prn anxiety  -Agitation             - Continue protocol with zydis/ativan/geodon PRN agitation  - Encourage  participation in  groups and the therapeutic milieu -Discharge planning will be ongoing    Micheal Likenshristopher T Callen Zuba, MD 03/28/2017, 1:58 PM

## 2017-03-28 NOTE — Progress Notes (Signed)
Patient approached medication window while another nurse was there and states, "I need something for my thyroid". Patient informed by RN Britta MccreedyBarbara B nothing is ordered for his thyroid at this time. Patient immediately becomes irate and yells, "so when I die my family can sue you?" RN Selena BattenKim asks Clinical research associatewriter to described his symptoms and explain why he needs something for his thyroid. Patient yells, "because this is my fucking thyroid right here and I'm not a fucking idiot". Patient pointing to his throat. Patient walks away from staff and continues to yell, cuss and returns to his room.

## 2017-03-28 NOTE — BH Specialist Note (Signed)
Pt's statements disorganized; flight-of-ideas; erratic behaviors (spontaneously clapped hands). Pacing slowly up and down halls; intrusive in conversations that others are having; talking to self "I saw a puddy cat. My grandma, I ned her". Asked for a set of ear plugs (given by staff). Can focus on direct communication, but evasive and veers off-topic. Intermittent cough noted. "I need a cough drop - I am about to die". Tessalon dose given by RN earlier. Lanora ManisElizabeth, RN obtained order for cough syrup.

## 2017-03-28 NOTE — Progress Notes (Signed)
Recreation Therapy Notes  Date: 03/28/17 Time: 1000 Location: 500 Hall Dayroom  Group Topic: Coping Skills  Goal Area(s) Addresses:  Patients will be able to identify positive coping skills. Patients will be able to identify benefits of using coping skills. Patients will be able to identify benefits of using coping skills post d/c.  Behavioral Response: Engaged  Intervention: Worksheet, dry erase board, dry erase marker.  Activity: Mind map.  Each patient was given a blank diagram of a mind map.  LRT and patients filled in the first 8 boxes together with situations were coping skills would be used (finances, overstimulated, anger, being picked on, depression, fatigue, restless and poor nutrition).  Individually, patients were to come up with at least 3 coping skills for each situation.  Group would reconvene and LRT would fill in coping skills on the board.  Education: PharmacologistCoping Skills, Building control surveyorDischarge Planning.   Education Outcome: Acknowledges understanding/In group clarification offered/Needs additional education.   Clinical Observations/Feedback: Pt was engaged and social with peers.  Pt identified some coping skills as eat more fruit, not over spending, checking blood sugar, take prescribed medications, talk to someone and read a book.    Caroll RancherMarjette Jarrel Knoke, LRT/CTRS      Caroll RancherLindsay, Lira Stephen A 03/28/2017 12:37 PM

## 2017-03-28 NOTE — Progress Notes (Signed)
Pt calmed a little after receiving all of the medications but has not slept but a short while   He spent most of the night pacing the hall and making delusional statements   He said he stole this writers eyes

## 2017-03-28 NOTE — Progress Notes (Signed)
Adult Psychoeducational Group Note  Date:  03/28/2017 Time:  6:18 PM  Group Topic/Focus:  Self Care:   The focus of this group is to help patients understand the importance of self-care in order to improve or restore emotional, physical, spiritual, interpersonal, and financial health. Wellness Toolbox:   The focus of this group is to discuss various aspects of wellness, balancing those aspects and exploring ways to increase the ability to experience wellness.  Patients will create a wellness toolbox for use upon discharge.  Participation Level:  Active  Participation Quality:  Attentive  Affect:  Appropriate  Cognitive:  Appropriate  Insight: Good  Engagement in Group:  Engaged  Modes of Intervention:  Education  Additional Comments:  Pt was discussing her goals and her plans for the future as it relates to wellness.  Pt talked about her family and home life.    Wyn ForsterLeonard B Kiah Keay 03/28/2017, 6:18 PM

## 2017-03-28 NOTE — Progress Notes (Signed)
Nursing Progress Note 1900-0730  D) Patient has been intrusive at the nurse's station with labile mood. Patient is laughing and smiling at times then irritable and cursing at staff. Patient denies SI/HI/AVH or pain. Patient refused to take the 250 mg tablet in his 750 mg dose of Depakote and threw it in the trash. Patient did take the 500 mg tablet. Patient states, "why is it 500 mg in the morning and 750 mg at night? I will only take 500 mg, I will tell the doctor tomorrow". Patient refused educated by Clinical research associatewriter and walked away from medication window. Patient states, "I have been off medications since 2012. I don't need them". Patient later requested PRN cough medicine and was agreeable to take PRN Vistaril for increasing anxiety. Patient became verbally aggressive to new male patient on the unit and required redirection by multiple staff members. Patient became increasingly agitated and was seen pacing, posturing and cursing in the hallway but refused other PRN anxiety/agitation medications from writer and stated, "no white doctor is gonna kill my brain". Patient eventually agreed to take PRN medications from RN Kim B. Patient currently resting in bed.  A) Patient educated about and provided medication as scheduled or requested per provider's orders. Patient safety maintained with q15 min safety checks. Low fall risk precautions in place. Emotional support given. 1:1 interaction and active listening provided. Snacks and fluids provided. Labs, vital signs and patient behavior monitored throughout shift. Patient encouraged to work on treatment plan.  R) Patient remains safe on the unit at this time. Patient agrees to make needs known to staff. Will continue to monitor and assess for changes.

## 2017-03-28 NOTE — [facility] Group Notes (Signed)
LCSW Group Therapy Note  03/28/2017 1:15pm  Type of Therapy/Topic:  Group Therapy:  Feelings about Diagnosis  Participation Level:  Did Not Attend   Description of Group:   This group will allow patients to explore their thoughts and feelings about diagnoses they have received. Patients will be guided to explore their level of understanding and acceptance of these diagnoses. Facilitator will encourage patients to process their thoughts and feelings about the reactions of others to their diagnosis and will guide patients in identifying ways to discuss their diagnosis with significant others in their lives. This group will be process-oriented, with patients participating in exploration of their own experiences, giving and receiving support, and processing challenge from other group members.   Therapeutic Goals: 1. Patient will demonstrate understanding of diagnosis as evidenced by identifying two or more symptoms of the disorder 2. Patient will be able to express two feelings regarding the diagnosis 3. Patient will demonstrate their ability to communicate their needs through discussion and/or role play  Summary of Patient Progress:       Therapeutic Modalities:   Cognitive Behavioral Therapy Brief Therapy Feelings Identification    Brad RogueRodney B Romonia Yanik, LCSW 03/28/2017 3:09 PM

## 2017-03-28 NOTE — Progress Notes (Signed)
Pt up at the nurses station cursing threatening staff calling people crackers and he said his family was cut up in a box and he has been murdered and that was the first time he had been killed    He is loud intrusive and cursing   Notified NP and received orders for ativan 2mg  one time dose   Pt took medication but argued about and said he wanted a bar of xanax

## 2017-03-29 MED ORDER — OLANZAPINE 10 MG PO TBDP
10.0000 mg | ORAL_TABLET | Freq: Three times a day (TID) | ORAL | Status: DC | PRN
  Administered 2017-03-29 – 2017-03-31 (×4): 10 mg via ORAL
  Filled 2017-03-29 (×4): qty 1

## 2017-03-29 MED ORDER — LORAZEPAM 1 MG PO TABS
1.0000 mg | ORAL_TABLET | ORAL | Status: AC | PRN
  Administered 2017-03-30: 1 mg via ORAL
  Filled 2017-03-29: qty 1

## 2017-03-29 MED ORDER — ZIPRASIDONE MESYLATE 20 MG IM SOLR: 20.0000 mg | INTRAMUSCULAR | Status: DC | PRN

## 2017-03-29 NOTE — [facility] Group Notes (Signed)
LCSW Group Therapy 03/29/2017 1:15pm  Type of Therapy and Topic:  Group Therapy:  Change and Accountability  Participation Level:  Active  Description of Group In this group, patients discussed power and accountability for change.  The group identified the challenges related to accountability and the difficulty of accepting the outcomes of negative behaviors.  Patients were encouraged to openly discuss a challenge/change they could take responsibility for.  Patients discussed the use of "change talk" and positive thinking as ways to support achievement of personal goals.  The group discussed ways to give support and empowerment to peers.  Therapeutic Goals: 1. Patients will state the relationship between personal power and accountability in the change process 2. Patients will identify the positive and negative consequences of a personal choice they have made 3. Patients will identify one challenge/choice they will take responsibility for making 4. Patients will discuss the role of "change talk" and the impact of positive thinking as it supports successful personal change 5. Patients will verbalize support and affirmation of change efforts in peers  Summary of Patient Progress:  In and out several times, buyt was present for most of the group.  Active.  Good mood.  No hostility. Talked about the death of his grandmother a year ago or so, and the death of his son's mother several years ago.  He stated that he is at peace with the loss of others, and this is because of his faith, and knowing that those that die "are in the hands of God."  Therapeutic Modalities Solution Focused Brief Therapy Motivational Interviewing Cognitive Behavioral Therapy  Brad RogueRodney B [name], LCSW 03/29/2017 2:07 PM

## 2017-03-29 NOTE — [facility] Group Notes (Signed)
[facility] Group Notes:  (Nursing/MHT/Case Management/Adjunct)  Date:  03/29/2017  Time:  5:08 PM  Type of Therapy:  Psychoeducational Skills  Participation Level:  Active  Participation Quality:  Appropriate and Attentive  Affect:  Appropriate  Cognitive:  Alert and Appropriate  Insight:  Appropriate and Good  Engagement in Group:  Developing/Improving and Engaged  Modes of Intervention:  Clarification, Discussion and Education  Summary of Progress/Problems:  Gwenyth AllegraHOMAS J Kya Mayfield 03/29/2017, 5:08 PM

## 2017-03-29 NOTE — Progress Notes (Signed)
Patient up pacing the halls. Patient states, "why can't I sit down in the dayroom? I'm tired and I want to watch TV". Patient informed the dayroom is closed for cleaning at this time. Patient states, "no one is in there you dumb hoe". Patient refusing to sit down in his room. Patient continues to pace and cuss.

## 2017-03-29 NOTE — Progress Notes (Signed)
DAR NOTE: Patient presents with irritable affect and mood.  Patient was disruptive in milieu and on the unit.  Verbally aggressive toward staff and peers.  Making threat to physically harm staff and peers.  Verbally redirected with poor effect.  Zyprexa zydis given with poor effect.  IM Geodon given with show of support when patient began to throw objects in the dayroom and threatening peers again. Denies pain, auditory and visual hallucinations.  Described energy level as low and concentration as good.  Rates depression at 0, hopelessness at 0, and anxiety at 3.  Maintained on routine safety checks.  Medications given as prescribed.  Support and encouragement offered as needed.  Attended group and participated.  States goal for today is "getting out of this hell hole."  Patient is safe on the unit.

## 2017-03-29 NOTE — Progress Notes (Signed)
[facility] MD Progress Note  03/29/2017 2:33 PM Brad Moreno  MRN:  [id number] Subjective:    Brad Moreno "Brad Kotyk" Moreno is a 37 y/o M with reported history of schizophrenia and bipolar disorder who was admitted on IVC placed by pt's wife with worsening symptoms of agitation and disorganization. Pt had been talking to himself and had non-goal directed activities of placing objects in the driveway and pouring trash all over the yard. 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 On initial presentation agreed to be changed from lithium to Depakote and agreed to trial of abilify. Pt had ongoing agitation and disorganized behaviors such as making multiple inappropriate gestures to attempt to provoke peers into escalating conflict, yelling and talking to himself loudly. He was also pacing on the unit, dancing, and hopping/jumping - causing disturbance to other patients. He made threatening statements to RN staff and this provider, and had difficulty being redirected on 2/18, requiring injection of geodon to control his agitation.  As per RN report, pt refused to take increased dose of depakote last evening. This AM, prior to interview, pt was escalating on the unit and throwing objects in the day room and scaring peers, which also required IM geodon to help control his behaviors.  Upon interview today pt was evaluated with RN staff present due to safety concerns. He presented as generally irritable, pressured, and labile. He was only minimally cooperative with interview, and did not answer questions about sleep, appetite, SI/HI/AH/VH - instead directing this provider to "read my sheet," referring to the daily patient symptom inventory. Pt was encouraged to share more about how he is feeling directly to this provider, and pt states, "I was doing ok until you all shot me up with medications I don't need." Pt was irritable, erratic, and escalating during the interview. He began to curse and made vague  threatening statements to this provider and RN, so interview was concluded. As pt had medications adjusted yesterday, we will continue his current regimen without changes, but we will consider medication adjustment if pt has ongoing difficulty with agitation.  Principal Problem: Bipolar disorder, unspecified (HCC) Diagnosis:   Patient Active Problem List   Diagnosis Date Noted  . Psychosis (HCC) [F29] 03/23/2017  . Alcohol use [Z78.9] 02/17/2017  . Bipolar disorder, unspecified (HCC) [F31.9] 06/09/2010   Total Time spent with patient: 30 minutes  Past Psychiatric History: see H&P  Past Medical History:  Past Medical History:  Diagnosis Date  . Bipolar 1 disorder (HCC)   . Depression 06/2010  . Schizophrenia (HCC)    History reviewed. No pertinent surgical history. Family History:  Family History  Problem Relation Age of Onset  . Depression Mother   . Diabetes Mother   . Cancer Paternal Grandfather 25       Colon Cancer  . Alcohol abuse Brother        Was Heavy drinker and smoker--has quit   Family Psychiatric  History: see H&P Social History:  Social History   Substance and Sexual Activity  Alcohol Use Yes  . Alcohol/week: 1.8 oz  . Types: 3 Cans of beer per week     Social History   Substance and Sexual Activity  Drug Use Yes  . Types: Marijuana    Social History   Socioeconomic History  . Marital status: Legally Separated    Spouse name: None  . Number of children: None  . Years of education: None  . Highest education level: None  Social  Needs  . Financial resource strain: None  . Food insecurity - worry: None  . Food insecurity - inability: None  . Transportation needs - medical: None  . Transportation needs - non-medical: None  Occupational History  . None  Tobacco Use  . Smoking status: Former Smoker    Packs/day: 0.50  . Smokeless tobacco: Never Used  Substance and Sexual Activity  . Alcohol use: Yes    Alcohol/week: 1.8 oz    Types: 3 Cans of  beer per week  . Drug use: Yes    Types: Marijuana  . Sexual activity: No  Other Topics Concern  . None  Social History Narrative   Entered at PCP Office 07/2013:   On Disability   Has 2 children--2 girls--age 9 y/o and 2/ y/o   At home, it is him and the kids.   Says he is separated. Says he goes to gym 5 days per week. The children's mother will watch children while he goes to the gym.   Says he has the kids himself most of the time.    Says he wants to go back to school himself-- once his youngest child gets into school.    Additional Social History:                         Sleep: Fair  Appetite:  Fair  Current Medications: Current Facility-Administered Medications  Medication Dose Route Frequency Provider Last Rate Last Dose  . acetaminophen (TYLENOL) tablet 650 mg  650 mg Oral Q6H PRN Micheal Likens, MD   325 mg at 03/28/17 [phone]  . alum & mag hydroxide-simeth (MAALOX/MYLANTA) 200-200-20 MG/5ML suspension 15 mL  15 mL Oral Q4H PRN Jolyne Loa T, MD      . ARIPiprazole (ABILIFY) tablet 20 mg  20 mg Oral QHS Jolyne Loa T, MD      . benzonatate (TESSALON) capsule 100 mg  100 mg Oral TID PRN Oneta Rack, NP   100 mg at 03/29/17 0512  . divalproex (DEPAKOTE) DR tablet 500 mg  500 mg Oral Veatrice Kells, MD   500 mg at 03/29/17 9604   And  . divalproex (DEPAKOTE) DR tablet 750 mg  750 mg Oral QHS Micheal Likens, MD   750 mg at 03/28/17 2001  . guaiFENesin (MUCINEX) 12 hr tablet 600 mg  600 mg Oral BID PRN Micheal Likens, MD   600 mg at 03/29/17 0511  . hydrOXYzine (ATARAX/VISTARIL) tablet 50 mg  50 mg Oral Q6H PRN Micheal Likens, MD   50 mg at 03/28/17 2115  . OLANZapine zydis (ZYPREXA) disintegrating tablet 10 mg  10 mg Oral Q8H PRN Micheal Likens, MD       And  . LORazepam (ATIVAN) tablet 1 mg  1 mg Oral PRN Micheal Likens, MD       And  . ziprasidone (GEODON)  injection 20 mg  20 mg Intramuscular PRN Micheal Likens, MD      . magnesium hydroxide (MILK OF MAGNESIA) suspension 15 mL  15 mL Oral Daily PRN Micheal Likens, MD   15 mL at 03/25/17 1631  . nicotine polacrilex (NICORETTE) gum 2 mg  2 mg Oral PRN Micheal Likens, MD   2 mg at 03/27/17 2015  . ziprasidone (GEODON) injection 20 mg  20 mg Intramuscular Once Oneta Rack, NP        Lab Results:  Results for  orders placed or performed during the hospital encounter of 03/23/17 (from the past 48 hour(s))  Valproic acid level     Status: None   Collection Time: 03/28/17  6:33 AM  Result Value Ref Range   Valproic Acid Lvl 53 50.0 - 100.0 ug/mL    Comment: Performed at [facility], [address] [place], [place] [address]    Blood Alcohol level:  Lab Results  Component Value Date   ETH <10 03/22/2017   ETH <10 03/03/2017    Metabolic Disorder Labs: No results found for: HGBA1C, MPG No results found for: PROLACTIN Lab Results  Component Value Date   CHOL 176 02/17/2017   TRIG 153 (H) 02/17/2017   HDL 66 02/17/2017   CHOLHDL 2.7 02/17/2017   VLDL 19 09/14/2013   LDLCALC 96 09/14/2013    Physical Findings: AIMS: Facial and Oral Movements Muscles of Facial Expression: None, normal Lips and Perioral Area: None, normal Jaw: None, normal Tongue: None, normal,Extremity Movements Upper (arms, wrists, hands, fingers): None, normal Lower (legs, knees, ankles, toes): None, normal, Trunk Movements Neck, shoulders, hips: None, normal, Overall Severity Severity of abnormal movements (highest score from questions above): None, normal Incapacitation due to abnormal movements: None, normal Patient's awareness of abnormal movements (rate only patient's report): No Awareness, Dental Status Current problems with teeth and/or dentures?: No Does patient usually wear dentures?: No  CIWA:  CIWA-Ar Total: 1 COWS:  COWS Total Score:  3  Musculoskeletal: Strength & Muscle Tone: within normal limits Gait & Station: normal Patient leans: N/A  Psychiatric Specialty Exam: Physical Exam  Nursing note and vitals reviewed.   Review of Systems  Constitutional: Negative for chills and fever.  Respiratory: Negative for cough and shortness of breath.   Cardiovascular: Negative for chest pain.  Gastrointestinal: Negative for abdominal pain, heartburn, nausea and vomiting.  Psychiatric/Behavioral: Negative for depression, hallucinations and suicidal ideas. The patient is not nervous/anxious.     Blood pressure (!) 144/99, pulse (!) 110, temperature 98.4 F (36.9 C), temperature source Oral, resp. rate 16, height 5' 8.75" (1.746 m), weight 95.3 kg (210 lb).Body mass index is 31.24 kg/m.  General Appearance: Disheveled  Eye Contact:  Fair  Speech:  Pressured  Volume:  Increased  Mood:  Angry, Dysphoric and Irritable  Affect:  Blunt, Inappropriate and Labile  Thought Process:  Coherent, Goal Directed and Descriptions of Associations: Loose  Orientation:  Full (Time, Place, and Person)  Thought Content:  Ideas of Reference:   Paranoia Delusions and Paranoid Ideation  Suicidal Thoughts:  No  Homicidal Thoughts:  No  Memory:  Immediate;   Fair Recent;   Fair Remote;   Fair  Judgement:  Poor  Insight:  Lacking  Psychomotor Activity:  Normal  Concentration:  Concentration: Fair  Recall:  FiservFair  Fund of Knowledge:  Fair  Language:  Fair  Akathisia:  No  Handed:    AIMS (if indicated):     Assets:  Manufacturing systems engineerCommunication Skills Physical Health Resilience Social Support  ADL's:  Intact  Cognition:  WNL  Sleep:  Number of Hours: 3.25    Treatment Plan Summary: Daily contact with patient to assess and evaluate symptoms and progress in treatment and Medication management. Pt has ongoing episodic agitation requiring PRN medications. His abilify and depakote were increased yesterday, and we will consider medication adjustment  tomorrow if he has ongoing agitation.  - Bipolar unspecified - Continue depakote DR 500mg  po qAM + 750mg  po qhs -Continue abilify 20mg  po qhs  -  Anxiety - Continue atarax 50mg  po q6h prn anxiety  -Agitation - Continue protocol with zydis/ativan/geodon PRN agitation  - Encourage participation in groups and the therapeutic milieu -Discharge planning will be ongoing   Micheal Likens, MD 03/29/2017, 2:33 PM

## 2017-03-29 NOTE — Progress Notes (Signed)
Recreation Therapy Notes  Date: 03/29/17 Time: 1000 Location: 500 Hall Dayroom  Group Topic: Self-Esteem  Goal Area(s) Addresses:  Patient will successfully identify positive attributes about themselves.  Patient will successfully identify benefit of improved self-esteem.   Behavioral Response: Engaged  Intervention: Markers, colored pencils, blank picture frame worksheet  Activity: Picture This.  Patients were given a blank sheet with an outline of a picture frame.  Patients were to describe themselves or things they like through pictures, words or a combination of both.  Education: Self-Esteem, Building control surveyorDischarge Planning.   Education Outcome: Acknowledges education/In group clarification offered/Needs additional education  Clinical Observations/Feedback: Pt arrived late to group.  Pt focused on his relationship with his daughter.  Pt stated "I love my daughter and I live for her. I don't know what I would do without her, I'm useless without her".  Had to be redirected a few time from getting of topic.      Caroll RancherMarjette Mickayla Trouten, LRT/CTRS      Lillia AbedLindsay, Yeudiel Mateo A 03/29/2017 11:40 AM

## 2017-03-29 NOTE — Progress Notes (Signed)
Writer approached patient who has been up at the nurse's station and in the dayroom offering scheduled Depakote. Patient refuses order saying, "I'll take it at 0800". Patient encouraged to take medication now but patient begins to Psychologist, counsellingmock writer and walk away. A few minutes later, patient approached nurses's station and asks "can I go to breakfast with the rest of these folk?" Patient is reminded he is on unit restriction per MD order. Patient becomes irate and begins to yell while walking down the hallway. Patient slams his bedroom door and yells, "fat fucking clown looking bitch". Patient then returns to nurse's station and asks, "where are the injections? Ya'll a bunch of fake bitches up in here". Patient continuing to be irritable, pace and be intrusive at the nurse's station. Will continue to support and monitor.

## 2017-03-29 NOTE — Progress Notes (Signed)
Adult Psychoeducational Group Note  Date:  03/29/2017 Time:  8:46 PM  Group Topic/Focus:  Wrap-Up Group:   The focus of this group is to help patients review their daily goal of treatment and discuss progress on daily workbooks.  Participation Level:  Active  Participation Quality:  Intrusive  Affect:  Anxious  Cognitive:  Confused  Insight: Limited  Engagement in Group:  Engaged  Modes of Intervention:  Discussion  Additional Comments:  The patient expressed that he rates today a 10.The patient still has a hard time saying on subject.  Octavio Mannshigpen, Nyia Tsao Lee 03/29/2017, 8:46 PM

## 2017-03-29 NOTE — Progress Notes (Signed)
Nursing Progress Note 1900-0730  D) Patient presents with hyperactivity and labile mood. On initial approach, patient does not engage much with Clinical research associatewriter and is making needs known to another RN on the floor. Patient did request nighttime medications from writer this evening and reported, "I don't think the Abilify is doing anything for me. That little yellow pill that dissolves works Artistamazing. I had a really bad phone call and I took some of that, it worked immediately". Patient states, "my ex-wife said some hurtful things to me, but that's life". Patient did attend group. Patient is seen interactive in the milieu. Patient denies SI/HI/AVH or pain. Patient contracts for safety on the unit. Patient compliant with medications and states, "I will take them because I want to get out of here".  A) Patient educated about and provided medication as scheduled or requested per provider's orders. Patient safety maintained with q15 min safety checks. Low fall risk precautions in place. Emotional support given. 1:1 interaction and active listening provided. Snacks and fluids provided. Labs, vital signs and patient behavior monitored throughout shift. Patient encouraged to work on treatment plan.  R) Patient remains safe on the unit at this time. Patient agrees to make needs known to staff. Will continue to monitor and assess for changes.

## 2017-03-30 MED ORDER — OLANZAPINE 5 MG PO TBDP
5.0000 mg | ORAL_TABLET | Freq: Two times a day (BID) | ORAL | Status: DC
  Administered 2017-03-30 – 2017-03-31 (×3): 5 mg via ORAL
  Filled 2017-03-30 (×2): qty 1
  Filled 2017-03-30: qty 14
  Filled 2017-03-30 (×3): qty 1
  Filled 2017-03-30: qty 14

## 2017-03-30 NOTE — Progress Notes (Addendum)
Patient approached nurses's station and states, "it's [id number]". Patient informed of correct time and patient becomes agitated and states, "ya'll turned the clocks back". Patient then states, "am I still on unit restriction? You can check my chart. Look it up. If I am then I'm going to hit somebody, male or male I don't care and that's not a threat it's a promise". Patient irate and yelling at staff in the hallway. Patient instructed to lower his voice because peers are sleeping. Patient states, "I need something for my anxiety". Patient provided PRN Zyprexa with no relief. Patient refuses PRN Geodon. Patient provided PRN Ativan with little effect. Patient remained at nurse's station singing loudly, refusing to return to his room. Patient states, "you can't make me go to my room". Patient awoke several peers during this time. Patient became verbally abusive to staff and stated to other RN on the hall, "you are foreign, you can't be trusted". Patient making sexually inappropriate comments to the male RNs on the hall including, "I don't mind being with bigger girls". Patient preoccupied with race and states this evening, "I'm Blasain" and told a peer "I'm Eden LatheJackie Chan". Patient difficult to redirect and disrespectful towards staff. Patient remains in the hallway at this time singing. AC and charge nurse made aware.

## 2017-03-30 NOTE — Progress Notes (Addendum)
Patient is attempting to view writer's computer. Patient informed he cannot view HIPPA protected information. Patient states, "you're writing horrible things about me so I stay here longer aren't you?" Patient becomes irate and states, "I am going to call someone to come after you. I'm gonna have them find out your license plate and follow you and beat your ass when you leave". Patient returns to his room.  Patient later pacing the halls singing, "this fat white bitch here has no man, and this ugly asian bitch is all alone". Patient redirected to his room and is compliant with MHTs request for only a few minutes.  Patient requiring constant redirection by staff.

## 2017-03-30 NOTE — [facility] Group Notes (Signed)
LCSW Group Therapy Note   03/30/2017 1:15pm   Type of Therapy and Topic:  Group Therapy:  Trust and Honesty  Participation Level:  Active  Description of Group:    In this group patients will be asked to explore the value of being honest.  Patients will be guided to discuss their thoughts, feelings, and behaviors related to honesty and trusting in others. Patients will process together how trust and honesty relate to forming relationships with peers, family members, and self. Each patient will be challenged to identify and express feelings of being vulnerable. Patients will discuss reasons why people are dishonest and identify alternative outcomes if one was truthful (to self or others). This group will be process-oriented, with patients participating in exploration of their own experiences, giving and receiving support, and processing challenge from other group members.   Therapeutic Goals: 1. Patient will identify why honesty is important to relationships and how honesty overall affects relationships.  2. Patient will identify a situation where they lied or were lied too and the  feelings, thought process, and behaviors surrounding the situation 3. Patient will identify the meaning of being vulnerable, how that feels, and how that correlates to being honest with self and others. 4. Patient will identify situations where they could have told the truth, but instead lied and explain reasons of dishonesty.   Summary of Patient Progress  Left for awhile, but was present for the majority of the group.  Very active.  Appropriate.  Talked about honesty in terms of people needing to be dishonest for the sake of self preservation.  Used examples of  [place] and slavery.  Later talked about forgiving his son's mother when she lied to him for the sake of his son.  "I had to do it for him, so I cried and then I was OK."  Therapeutic Modalities:   Cognitive Behavioral Therapy Solution Focused  Therapy Motivational Interviewing Brief Therapy  Brad RogueRodney B Yossef Gilkison, LCSW 03/30/2017 1:43 PM

## 2017-03-30 NOTE — Progress Notes (Signed)
Recreation Therapy Notes  Date: 03/30/17 Time: 1000 Location: 500 Hall Dayroom  Group Topic: Anger Management  Goal Area(s) Addresses:  Patient will identify triggers for anger.  Patient will identify physical reaction to anger.   Patient will identify benefit of using coping skills when angry.  Behavioral Response: Engaged  Intervention: Worksheet  Activity: Introduction to Anger Management.  Patients were to identify at least 3 things that get them angry, what's their reaction to anger and problems they have run into because of anger.  Education: Anger Management, Discharge Planning   Education Outcome: Acknowledges education/In group clarification offered/Needs additional education.   Clinical Observations/Feedback: Pt stated "anger is not bad".  Pt stated he gets angry when people snitch, tech's treating him different because of his skin tone and road rage.  Pt stated he reacts to anger by "calling the police, listening to Cardi B and sing loves songs".  Pt explained his anger has caused damaged relationships, public altercations and being cursed out.  Pt identified some of coping skills as exercise and reading.     Caroll RancherMarjette Oryon Gary, LRT/CTRS       Caroll RancherLindsay, Berlie Persky A 03/30/2017 12:40 PM

## 2017-03-30 NOTE — Progress Notes (Signed)
Patient verbally threatening to MHT. Patient states, "if I see this nigga again I'm gonna start swinging at this nigga". Patient has been targeting this particular MHT all night throughout the shift. Patient has returned to his room with breakfast tray but continues to yell and cuss.

## 2017-03-30 NOTE — Tx Team (Signed)
Interdisciplinary Treatment and Diagnostic Plan Update  03/30/2017 Time of Session: 3:53 PM  SHARAD VANEATON MRN: [id number]  Principal Diagnosis: Bipolar disorder, unspecified ([place])  Secondary Diagnoses: Principal Problem:   Bipolar disorder, unspecified ([place]) Active Problems:   Psychosis ([place])   Current Medications:  Current Facility-Administered Medications  Medication Dose Route Frequency Provider Last Rate Last Dose  . acetaminophen (TYLENOL) tablet 650 mg  650 mg Oral Q6H PRN Pennelope Bracken, MD   325 mg at 03/28/17 [phone]  . alum & mag hydroxide-simeth (MAALOX/MYLANTA) 200-200-20 MG/5ML suspension 15 mL  15 mL Oral Q4H PRN Maris Berger T, MD      . ARIPiprazole (ABILIFY) tablet 20 mg  20 mg Oral QHS Pennelope Bracken, MD   20 mg at 03/29/17 2100  . benzonatate (TESSALON) capsule 100 mg  100 mg Oral TID PRN Derrill Center, NP   100 mg at 03/30/17 2671  . divalproex (DEPAKOTE) DR tablet 500 mg  500 mg Oral Tana Conch T, MD   500 mg at 03/30/17 0606   And  . divalproex (DEPAKOTE) DR tablet 750 mg  750 mg Oral QHS Maris Berger T, MD   750 mg at 03/29/17 2100  . guaiFENesin (MUCINEX) 12 hr tablet 600 mg  600 mg Oral BID PRN Maris Berger T, MD   600 mg at 03/30/17 2458  . hydrOXYzine (ATARAX/VISTARIL) tablet 50 mg  50 mg Oral Q6H PRN Pennelope Bracken, MD   50 mg at 03/28/17 2115  . magnesium hydroxide (MILK OF MAGNESIA) suspension 15 mL  15 mL Oral Daily PRN Pennelope Bracken, MD   15 mL at 03/25/17 1631  . nicotine polacrilex (NICORETTE) gum 2 mg  2 mg Oral PRN Pennelope Bracken, MD   2 mg at 03/27/17 2015  . OLANZapine zydis (ZYPREXA) disintegrating tablet 10 mg  10 mg Oral Q8H PRN Pennelope Bracken, MD   10 mg at 03/30/17 0200   And  . ziprasidone (GEODON) injection 20 mg  20 mg Intramuscular PRN Pennelope Bracken, MD      . OLANZapine zydis (ZYPREXA) disintegrating tablet 5 mg  5  mg Oral BID Pennelope Bracken, MD   5 mg at 03/30/17 1505  . ziprasidone (GEODON) injection 20 mg  20 mg Intramuscular Once Derrill Center, NP        PTA Medications: Medications Prior to Admission  Medication Sig Dispense Refill Last Dose  . lithium carbonate (ESKALITH) 450 MG CR tablet Take 450 mg by mouth 2 (two) times daily.   03/22/2017 at Unknown time    Patient Stressors: Medication change or noncompliance Other: Ongoing mental illness  Patient Strengths: Motivation for treatment/growth Physical Health Supportive family/friends  Treatment Modalities: Medication Management, Group therapy, Case management,  1 to 1 session with clinician, Psychoeducation, Recreational therapy.   Physician Treatment Plan for Primary Diagnosis: Bipolar disorder, unspecified ([place]) Long Term Goal(s): Improvement in symptoms so as ready for discharge  Short Term Goals: Ability to demonstrate self-control will improve Compliance with prescribed medications will improve  Medication Management: Evaluate patient's response, side effects, and tolerance of medication regimen.  Therapeutic Interventions: 1 to 1 sessions, Unit Group sessions and Medication administration.  Evaluation of Outcomes: Progressing   2/20: Pt has ongoing pressured behaviors, irritability, and episodic agitation. He has been threatening to staff and peers. We will start scheduled zyprexa in addition to his current treatment regimen. - Bipolar unspecified             -  Start zyprexa 4m po BID - Continuedepakote DR 5013mpo qAM + 75031mo qhs -Continueabilify 104m47m qhs  -Anxiety - Continue atarax 50mg23mq6h prn anxiety  -Agitation - Continue protocol with zydis/ativan/geodon PRN agitation    Physician Treatment Plan for Secondary Diagnosis: Principal Problem:   Bipolar disorder, unspecified (HCC) [place] Problems:   Psychosis (HCC) [place] Term Goal(s):  Improvement in symptoms so as ready for discharge  Short Term Goals: Ability to demonstrate self-control will improve Compliance with prescribed medications will improve  Medication Management: Evaluate patient's response, side effects, and tolerance of medication regimen.  Therapeutic Interventions: 1 to 1 sessions, Unit Group sessions and Medication administration.  Evaluation of Outcomes: Progressing   RN Treatment Plan for Primary Diagnosis: Bipolar disorder, unspecified (HCC) [place] Term Goal(s): Knowledge of disease and therapeutic regimen to maintain health will improve  Short Term Goals: Ability to identify and develop effective coping behaviors will improve and Compliance with prescribed medications will improve  Medication Management: RN will administer medications as ordered by provider, will assess and evaluate patient's response and provide education to patient for prescribed medication. RN will report any adverse and/or side effects to prescribing provider.  Therapeutic Interventions: 1 on 1 counseling sessions, Psychoeducation, Medication administration, Evaluate responses to treatment, Monitor vital signs and CBGs as ordered, Perform/monitor CIWA, COWS, AIMS and Fall Risk screenings as ordered, Perform wound care treatments as ordered.  Evaluation of Outcomes: Progressing   LCSW Treatment Plan for Primary Diagnosis: Bipolar disorder, unspecified (HCC) [place] Term Goal(s): Safe transition to appropriate next level of care at discharge, Engage patient in therapeutic group addressing interpersonal concerns.  Short Term Goals: Engage patient in aftercare planning with referrals and resources  Therapeutic Interventions: Assess for all discharge needs, 1 to 1 time with Social worker, Explore available resources and support systems, Assess for adequacy in community support network, Educate family and significant other(s) on suicide prevention, Complete Psychosocial Assessment,  Interpersonal group therapy.  Evaluation of Outcomes: Met  Return home, follow up [place]: Attending groups: Yes Participating in groups: Yes Taking medication as prescribed: Yes Toleration medication: Yes, no side effects reported at this time Family/Significant other contact made: No Patient understands diagnosis: No Limited insight Discussing patient identified problems/goals with staff: Yes Medical problems stabilized or resolved: Yes Denies suicidal/homicidal ideation: Yes Issues/concerns per patient self-inventory: None Other: N/A  New problem(s) identified: None identified at this time.   New Short Term/Long Term Goal(s): "to get peace of mind"     Discharge Plan or Barriers:   Reason for Continuation of Hospitalization: Mood instability Paranoia Disorganization Medication stabilization   Estimated Length of Stay: 2/25  Attendees: Patient: 03/30/2017  3:53 PM  Physician: ChrisMaris Berger2/20/2019  3:53 PM  Nursing: Roni Elesa Massed2/20/2019  3:53 PM  RN Care Manager: JenniLars Pinks2/20/2019  3:53 PM  Social Worker: Rod NRipley Fraise/2019  3:53 PM  Recreational Therapist: MarjeWinfield Cunas/2019  3:53 PM  Other: DelorNorberto Sorenson/2019  3:53 PM  Other:  03/30/2017  3:53 PM    Scribe for Treatment Team:  RodneRoque Lias 03/30/2017 3:53 PM

## 2017-03-30 NOTE — Progress Notes (Addendum)
Patient refused to return to his room until both RNs were not visible on the unit. Both MHTs remained visible on the hall monitoring patient. Patient is now in his room. Will continue to monitor.

## 2017-03-30 NOTE — Progress Notes (Signed)
[facility]  03/30/2017 12:21 PM Brad Moreno  MRN:  [id number] Subjective:    Brad Moreno is a 37 y/o M with reported history of schizophrenia and bipolar disorder who was admitted on IVC placed by pt's wife with worsening symptoms of agitation and disorganization. Pt had been talking to himself and had non-goal directed activities of placing objects in the driveway and pouring trash all over the yard. Pt had also made statements that he was a Research scientist (physical sciences)superstar celebrity.On initial presentationagreed to be changed from lithium to Depakote andagreed to trial of abilify. Pt had ongoing agitation and disorganized behaviors such asmaking multiple inappropriate gestures to attempt to provoke peers into escalating conflict,yelling and talking to himself loudly. He was also pacing on the unit, dancing, and hopping/jumping - causing disturbance to other patients.He made threatening statements to RN staff and this provider, and had difficulty being redirected on 2/18, requiring injection of geodon to control his agitation.  As per RN report, pt  Made additional threats last night to be violent towards staff and/or peers. He also had poor sleep.   Upon interview today, pt is irritable, pressured, loud, and reactive to most questions and observations by this provider. Pt reports his mood is "great - ten out of ten." He denies SI/HI/AH/VH. He reports that he slept well despite RN staff recorded minimal sleep. He denies any physical complaints. Attempted to discuss with patient about his behaviors and threatening statements, and pt grew increasingly irritable. He refuted threatening anyone while cursing and beginning to raise his voice in the office. Suggested to patient that we add scheduled zyprexa to his regimen to help with controlling his behaviors, and pt replied, "I ain't taking shit and you can write that down, snitch!" Pt then left the interview room.  Principal Problem: Bipolar disorder,  unspecified (HCC) Diagnosis:   Patient Active Problem List   Diagnosis Date Noted  . Psychosis (HCC) [F29] 03/23/2017  . Alcohol use [Z78.9] 02/17/2017  . Bipolar disorder, unspecified (HCC) [F31.9] 06/09/2010   Total Time spent with patient: 30 minutes  Past Psychiatric History: see H&P  Past Medical History:  Past Medical History:  Diagnosis Date  . Bipolar 1 disorder (HCC)   . Depression 06/2010  . Schizophrenia (HCC)    History reviewed. No pertinent surgical history. Family History:  Family History  Problem Relation Age of Onset  . Depression Mother   . Diabetes Mother   . Cancer Paternal Grandfather [id number]       Colon Cancer  . Alcohol abuse Brother        Was Heavy drinker and smoker--has quit   Family Psychiatric  History: see H&P Social History:  Social History   Substance and Sexual Activity  Alcohol Use Yes  . Alcohol/week: 1.8 oz  . Types: 3 Cans of beer per week     Social History   Substance and Sexual Activity  Drug Use Yes  . Types: Marijuana    Social History   Socioeconomic History  . Marital status: Legally Separated    Spouse name: None  . Number of children: None  . Years of education: None  . Highest education level: None  Social Needs  . Financial resource strain: None  . Food insecurity - worry: None  . Food insecurity - inability: None  . Transportation needs - medical: None  . Transportation needs - non-medical: None  Occupational History  . None  Tobacco Use  . Smoking status: Former Smoker  Packs/day: 0.50  . Smokeless tobacco: Never Used  Substance and Sexual Activity  . Alcohol use: Yes    Alcohol/week: 1.8 oz    Types: 3 Cans of beer per week  . Drug use: Yes    Types: Marijuana  . Sexual activity: No  Other Topics Concern  . None  Social History Narrative   Entered at PCP Office 07/2013:   On Disability   Has 2 children--2 girls--age 107 y/o and 2/ y/o   At home, it is him and the kids.   Says he is separated.  Says he goes to gym 5 days per week. The children's mother will watch children while he goes to the gym.   Says he has the kids himself most of the time.    Says he wants to go back to school himself-- once his youngest child gets into school.    Additional Social History:                         Sleep: Poor  Appetite:  Fair  Current Medications: Current Facility-Administered Medications  Medication Dose Route Frequency Provider Last Rate Last Dose  . acetaminophen (TYLENOL) tablet 650 mg  650 mg Oral Q6H PRN Micheal Likens, MD   325 mg at 03/28/17 [phone]  . alum & mag hydroxide-simeth (MAALOX/MYLANTA) 200-200-20 MG/5ML suspension 15 mL  15 mL Oral Q4H PRN Jolyne Loa T, MD      . ARIPiprazole (ABILIFY) tablet 20 mg  20 mg Oral QHS Micheal Likens, MD   20 mg at 03/29/17 2100  . benzonatate (TESSALON) capsule 100 mg  100 mg Oral TID PRN Oneta Rack, NP   100 mg at 03/30/17 1191  . divalproex (DEPAKOTE) DR tablet 500 mg  500 mg Oral Billie Lade T, MD   500 mg at 03/30/17 0606   And  . divalproex (DEPAKOTE) DR tablet 750 mg  750 mg Oral QHS Jolyne Loa T, MD   750 mg at 03/29/17 2100  . guaiFENesin (MUCINEX) 12 hr tablet 600 mg  600 mg Oral BID PRN Jolyne Loa T, MD   600 mg at 03/30/17 4782  . hydrOXYzine (ATARAX/VISTARIL) tablet 50 mg  50 mg Oral Q6H PRN Micheal Likens, MD   50 mg at 03/28/17 2115  . magnesium hydroxide (MILK OF MAGNESIA) suspension 15 mL  15 mL Oral Daily PRN Micheal Likens, MD   15 mL at 03/25/17 1631  . nicotine polacrilex (NICORETTE) gum 2 mg  2 mg Oral PRN Micheal Likens, MD   2 mg at 03/27/17 2015  . OLANZapine zydis (ZYPREXA) disintegrating tablet 10 mg  10 mg Oral Q8H PRN Micheal Likens, MD   10 mg at 03/30/17 0200   And  . ziprasidone (GEODON) injection 20 mg  20 mg Intramuscular PRN Micheal Likens, MD      . ziprasidone (GEODON)  injection 20 mg  20 mg Intramuscular Once Oneta Rack, NP        Lab Results: No results found for this or any previous visit (from the past 48 hour(s)).  Blood Alcohol level:  Lab Results  Component Value Date   ETH <10 03/22/2017   ETH <10 03/03/2017    Metabolic Disorder Labs: No results found for: HGBA1C, MPG No results found for: PROLACTIN Lab Results  Component Value Date   CHOL 176 02/17/2017   TRIG 153 (H) 02/17/2017   HDL 66  02/17/2017   CHOLHDL 2.7 02/17/2017   VLDL 19 09/14/2013   LDLCALC 96 09/14/2013    Physical Findings: AIMS: Facial and Oral Movements Muscles of Facial Expression: None, normal Lips and Perioral Area: None, normal Jaw: None, normal Tongue: None, normal,Extremity Movements Upper (arms, wrists, hands, fingers): None, normal Lower (legs, knees, ankles, toes): None, normal, Trunk Movements Neck, shoulders, hips: None, normal, Overall Severity Severity of abnormal movements (highest score from questions above): None, normal Incapacitation due to abnormal movements: None, normal Patient's awareness of abnormal movements (rate only patient's report): No Awareness, Dental Status Current problems with teeth and/or dentures?: No Does patient usually wear dentures?: No  CIWA:  CIWA-Ar Total: 1 COWS:  COWS Total Score: 3  Musculoskeletal: Strength & Muscle Tone: within normal limits Gait & Station: normal Patient leans: N/A  Psychiatric Specialty Exam: Physical Exam  Nursing note and vitals reviewed.   Review of Systems  Constitutional: Negative for chills and fever.  Respiratory: Negative for cough and shortness of breath.   Cardiovascular: Negative for chest pain.  Gastrointestinal: Negative for abdominal pain, heartburn, nausea and vomiting.  Psychiatric/Behavioral: Negative for depression, hallucinations and suicidal ideas. The patient has insomnia. The patient is not nervous/anxious.     Blood pressure 117/71, pulse (!) 108,  temperature 98.1 F (36.7 C), temperature source Oral, resp. rate 18, height 5' 8.75" (1.746 m), weight 95.3 kg (210 lb).Body mass index is 31.24 kg/m.  General Appearance: Casual  Eye Contact:  Good  Speech:  Pressured  Volume:  Increased  Mood:  Angry, Dysphoric and Irritable  Affect:  Blunt and Labile  Thought Process:  Coherent, Goal Directed and Descriptions of Associations: Loose  Orientation:  Full (Time, Place, and Person)  Thought Content:  Delusions and Paranoid Ideation  Suicidal Thoughts:  No  Homicidal Thoughts:  No  Memory:  Immediate;   Fair Recent;   Fair Remote;   Fair  Judgement:  Poor  Insight:  Lacking  Psychomotor Activity:  Increased  Concentration:  Concentration: Poor  Recall:  Poor  Fund of Knowledge:  Fair  Language:  Fair  Akathisia:  No  Handed:    AIMS (if indicated):     Assets:  Resilience  ADL's:  Intact  Cognition:  WNL  Sleep:  Number of Hours: 1.75    Treatment Plan Summary: Daily contact with patient to assess and evaluate symptoms and progress in treatment and Medication management. Pt has ongoing pressured behaviors, irritability, and episodic agitation. He has been threatening to staff and peers. We will start scheduled zyprexa in addition to his current treatment regimen.   - Bipolar unspecified   - Start zyprexa 5mg  po BID - Continue depakote DR 500mg  po qAM + 750mg  po qhs -Continue abilify 20mg  po qhs  -Anxiety - Continue atarax 50mg  po q6h prn anxiety  -Agitation - Continue protocol with zydis/ativan/geodon PRN agitation  - Encourage participation in groups and the therapeutic milieu -Discharge planning will be ongoing   Micheal Likens, MD 03/30/2017, 12:21 PM

## 2017-03-30 NOTE — Progress Notes (Addendum)
Pt approach Clinical research associatewriter and states "You're fine; I like me Cong[place] woman". Pt continues to make inappropriate comments to Clinical research associatewriter.Pt remains intrusive and manic. Pt has poor boundaries. Will monitor behavior.

## 2017-03-30 NOTE — Progress Notes (Signed)
Patient back up at nurse's station rapping and singing in the hallway. Patient requesting scissors but is denied by staff. Patient encouraged to remain quiet to sleeping peers and return to bed. Patient is not redirectable by staff. Will continue to monitor.

## 2017-03-31 DIAGNOSIS — G4700 Insomnia, unspecified: Secondary | ICD-10-CM

## 2017-03-31 DIAGNOSIS — Z630 Problems in relationship with spouse or partner: Secondary | ICD-10-CM

## 2017-03-31 DIAGNOSIS — F312 Bipolar disorder, current episode manic severe with psychotic features: Secondary | ICD-10-CM

## 2017-03-31 MED ORDER — CHLORPROMAZINE HCL 25 MG/ML IJ SOLN
INTRAMUSCULAR | Status: AC
  Filled 2017-03-31: qty 2

## 2017-03-31 MED ORDER — DIPHENHYDRAMINE HCL 50 MG/ML IJ SOLN
50.0000 mg | Freq: Once | INTRAMUSCULAR | Status: DC
  Filled 2017-03-31 (×2): qty 1

## 2017-03-31 MED ORDER — DIVALPROEX SODIUM 500 MG PO DR TAB: 500.0000 mg | DELAYED_RELEASE_TABLET | ORAL | 0 refills | Status: DC

## 2017-03-31 MED ORDER — LORAZEPAM 1 MG PO TABS
ORAL_TABLET | ORAL | Status: AC
  Filled 2017-03-31: qty 2

## 2017-03-31 MED ORDER — HYDROXYZINE HCL 50 MG PO TABS: 50.0000 mg | ORAL_TABLET | Freq: Four times a day (QID) | ORAL | 0 refills | Status: DC | PRN

## 2017-03-31 MED ORDER — DIPHENHYDRAMINE HCL 25 MG PO CAPS
ORAL_CAPSULE | ORAL | Status: AC
  Filled 2017-03-31: qty 2

## 2017-03-31 MED ORDER — LORAZEPAM 1 MG PO TABS
2.0000 mg | ORAL_TABLET | Freq: Once | ORAL | Status: AC
  Administered 2017-03-31: 1 mg via ORAL

## 2017-03-31 MED ORDER — CHLORPROMAZINE HCL 25 MG/ML IJ SOLN
50.0000 mg | Freq: Once | INTRAMUSCULAR | Status: DC
  Filled 2017-03-31: qty 2

## 2017-03-31 MED ORDER — ARIPIPRAZOLE 20 MG PO TABS: 20.0000 mg | ORAL_TABLET | Freq: Every day | ORAL | 0 refills | Status: DC

## 2017-03-31 MED ORDER — CHLORPROMAZINE HCL 50 MG PO TABS
50.0000 mg | ORAL_TABLET | Freq: Once | ORAL | Status: AC
  Administered 2017-03-31: 50 mg via ORAL

## 2017-03-31 MED ORDER — DIVALPROEX SODIUM 250 MG PO DR TAB: 750.0000 mg | DELAYED_RELEASE_TABLET | Freq: Every day | ORAL | 0 refills | Status: DC

## 2017-03-31 MED ORDER — DIPHENHYDRAMINE HCL 50 MG PO CAPS
50.0000 mg | ORAL_CAPSULE | Freq: Once | ORAL | Status: AC
  Administered 2017-03-31: 50 mg via ORAL
  Filled 2017-03-31: qty 1

## 2017-03-31 MED ORDER — NICOTINE POLACRILEX 2 MG MT GUM: 2.0000 mg | CHEWING_GUM | OROMUCOSAL | 0 refills | Status: DC | PRN

## 2017-03-31 MED ORDER — CHLORPROMAZINE HCL 50 MG PO TABS
ORAL_TABLET | ORAL | Status: AC
  Filled 2017-03-31: qty 1

## 2017-03-31 MED ORDER — OLANZAPINE 5 MG PO TBDP: 5.0000 mg | ORAL_TABLET | Freq: Two times a day (BID) | ORAL | 0 refills | Status: DC

## 2017-03-31 NOTE — Discharge Summary (Signed)
Physician Discharge Summary Note  Patient:  Brad Moreno is an 37 y.o., male MRN:  [id number] DOB:  06/01/1980 Patient phone:  [phone] (home)  Patient address:   [address] [place] [id number],  Total Time spent with patient: Greater than 30 minutes  Date of Admission:  03/23/2017 Date of Discharge: 03-31-17  Reason for Admission: Worsening symptoms of Bipolar disorder manifested as disorganization & agitations.  Principal Problem: Bipolar disorder, unspecified [facility](HCC)  Discharge Diagnoses: Patient Active Problem List   Diagnosis Date Noted  . Psychosis (HCC) [F29] 03/23/2017  . Alcohol use [Z78.9] 02/17/2017  . Bipolar disorder, unspecified (HCC) [F31.9] 06/09/2010   Past Psychiatric History: Bipolar affective disorder with psychosis.  Past Medical History:  Past Medical History:  Diagnosis Date  . Bipolar 1 disorder (HCC)   . Depression 06/2010  . Schizophrenia (HCC)    History reviewed. No pertinent surgical history. Family History:  Family History  Problem Relation Age of Onset  . Depression Mother   . Diabetes Mother   . Cancer Paternal Grandfather [id number]       Colon Cancer  . Alcohol abuse Brother        Was Heavy drinker and smoker--has quit   Family Psychiatric  History: See H&P Social History:  Social History   Substance and Sexual Activity  Alcohol Use Yes  . Alcohol/week: 1.8 oz  . Types: 3 Cans of beer per week     Social History   Substance and Sexual Activity  Drug Use Yes  . Types: Marijuana    Social History   Socioeconomic History  . Marital status: Legally Separated    Spouse name: None  . Number of children: None  . Years of education: None  . Highest education level: None  Social Needs  . Financial resource strain: None  . Food insecurity - worry: None  . Food insecurity - inability: None  . Transportation needs - medical: None  . Transportation needs - non-medical: None  Occupational History  . None  Tobacco Use  .  Smoking status: Former Smoker    Packs/day: 0.50  . Smokeless tobacco: Never Used  Substance and Sexual Activity  . Alcohol use: Yes    Alcohol/week: 1.8 oz    Types: 3 Cans of beer per week  . Drug use: Yes    Types: Marijuana  . Sexual activity: No  Other Topics Concern  . None  Social History Narrative   Entered at PCP Office 07/2013:   On Disability   Has 2 children--2 girls--age 468 y/o and 2/ y/o   At home, it is him and the kids.   Says he is separated. Says he goes to gym 5 days per week. The children's mother will watch children while he goes to the gym.   Says he has the kids himself most of the time.    Says he wants to go back to school himself-- once his youngest child gets into school.    Hospital Course: (Per admission assessment): Brad Moreno is a 37 y/o M with reported history of schizophrenia and bipolar disorder who was admitted on IVC placed by pt's wife with worsening symptoms of agitation and disorganization. Pt had been talking to himself and had non-goal directed activities of placing objects in the driveway and pouring trash all over the yard. Pt had also made statements that he was a Research scientist (physical sciences)superstar celebrity. Upon initial presentation, pt states, "I can't get any peace at home." Pt is  generally a poor historian - giving vague and minimal responses. He appears to minimize the concerns which were raised on his initial presentation, but he does confirm that he has been off of psychotropic medications for at least months. Pt continues, "I just got out of [facility] on the 4th and I was there for two weeks, and this lithium has got me feeling slowed down".  After evaluation of his presenting symptoms, Brad Moreno was started on the medication regimen targeting those presenting symptoms. Their indications & side effects were explained to him. And with his consent, he received & was discharged on; Abilify 20 mg for mood control, Depakote 500 mg in am for mood control,  Depakote 750 mg for mood stabilization, Hydroxyzine 50 mg prn for anxiety, Nicorette gum 2 mg for smoking cessation & Olanzapine Zydis 5 mg bid for mood control. He presented no other significant pre-existing medical problems that required treatment.   As it is noted above, Brad Moreno is being discharge on 2 separate antipsychotic medications. This is because he has not been able to achieve symptoms control under an antipsychotic monotherapy., As a result, he is currently receiving and being discharged on 2 separate antipsychotic medications (Abilify & & Olanzapine Zydis) which seem effective in controlling his symptoms at this time. It will benefit patient to continue on these combination antipsychotic therapies as recommended. However, as his symptoms continue to improve, patient may be titrated down to an antipsychotic monotherapy. This is to decrease the chances for development of metabolic syndrome & other related adverse effects associated with use of multiple antipsychotic therapies. This has to be done within the discretion & proper judgement of his outpatient psychiatric provider.  During the course of his hosptalization, Brad Moreno's improvement was monitored by observation & daily assessments. It took quite sometime, but eventually his report of symptom reduction was noted & observed. His emotional & mental status were also monitored by daily self-inventory reports completed by him & the clinical staff. Brad Moreno reported gradual improvement on daily basis & denied any new concerns. He was enrolled & encouraged to attend group therapy seesions to help with recognizing the triggers of his emotional crises & ways to cope better with them.    Brad Moreno was seen this morning by the attending psychiatrist. His reason for admission, treatment plans & response to treatment were discussed. He endorsed that he is doing well & ready to be discharged to continue mental health care on an outpatient basis as noted  below. He was provided with all the necessary information needed to make this appointment without problems. While Brad Moreno was waiting for his father to come pick him up from the hospital, he was observed on the phone threatening his wife. He was approached by the NP & the charge nurse to inform him that he is to not threaten any harm to anyone or his threats will be reported to the law enforcement. Dustin replied that he was not threatening anyone, rather, he was calling his wife/father in-law to check on his daughter.  Upon discharge, Brad Moreno presents both mentally and medically stable denying suicidal/homicidal ideation, auditory/visual/tactile hallucinations, delusional thoughts and paranoia. He left [facility] with all personal belongings in no apparentdistress. Transportation per his arrangement (father).  Physical Findings: AIMS: Facial and Oral Movements Muscles of Facial Expression: None, normal Lips and Perioral Area: None, normal Jaw: None, normal Tongue: None, normal,Extremity Movements Upper (arms, wrists, hands, fingers): None, normal Lower (legs, knees, ankles, toes): None, normal, Trunk Movements Neck, shoulders, hips: None, normal,  Overall Severity Severity of abnormal movements (highest score from questions above): None, normal Incapacitation due to abnormal movements: None, normal Patient's awareness of abnormal movements (rate only patient's report): No Awareness, Dental Status Current problems with teeth and/or dentures?: No Does patient usually wear dentures?: No  CIWA:  CIWA-Ar Total: 1 COWS:  COWS Total Score: 3  Musculoskeletal: Strength & Muscle Tone: within normal limits Gait & Station: normal Patient leans: N/A  Psychiatric Specialty Exam: Physical Exam  Nursing note and vitals reviewed. Constitutional: He appears well-developed.  HENT:  Head: Normocephalic.  Eyes: Pupils are equal, round, and reactive to light.  Neck: Normal range of motion.  Cardiovascular:  Normal rate.  Respiratory: Effort normal.  GI: Soft.  Genitourinary:  Genitourinary Comments: Deferred  Musculoskeletal: Normal range of motion.  Neurological: He is alert.  Skin: Skin is warm.    Review of Systems  Constitutional: Negative.   HENT: Negative.   Eyes: Negative.   Respiratory: Negative.   Cardiovascular: Negative.   Gastrointestinal: Negative.   Genitourinary: Negative.   Musculoskeletal: Negative.   Skin: Negative.   Neurological: Negative.   Endo/Heme/Allergies: Negative.   Psychiatric/Behavioral: Positive for hallucinations (Hx. Psychosis, mania, paranoia). Negative for depression, memory loss and suicidal ideas. The patient has insomnia (Stable). The patient is not nervous/anxious.     Blood pressure 117/71, pulse (!) 108, temperature 98.1 F (36.7 C), temperature source Oral, resp. rate 18, height 5' 8.75" (1.746 m), weight 95.3 kg (210 lb).Body mass index is 31.24 kg/m.  See Md's SRA   Have you used any form of tobacco in the last 30 days? (Cigarettes, Smokeless Tobacco, Cigars, and/or Pipes): Yes  Has this patient used any form of tobacco in the last 30 days? (Cigarettes, Smokeless Tobacco, Cigars, and/or Pipes):Yes, an FDA-approved tobacco cessation medication was offered at discharge.  Blood Alcohol level:  Lab Results  Component Value Date   ETH <10 03/22/2017   ETH <10 03/03/2017   Metabolic Disorder Labs:  No results found for: HGBA1C, MPG No results found for: PROLACTIN Lab Results  Component Value Date   CHOL 176 02/17/2017   TRIG 153 (H) 02/17/2017   HDL 66 02/17/2017   CHOLHDL 2.7 02/17/2017   VLDL 19 09/14/2013   LDLCALC 96 09/14/2013   See Psychiatric Specialty Exam and Suicide Risk Assessment completed by Attending Physician prior to discharge.  Discharge destination:  Home  Is patient on multiple antipsychotic therapies at discharge:  Yes,   Do you recommend tapering to monotherapy for antipsychotics?  Yes, Per out outpatient  provider.  Has Patient had three or more failed trials of antipsychotic monotherapy by history:  Yes,   Antipsychotic medications that previously failed include:   1.  Risperdal., 2.  Abilify. and 3.  Haldol.  Recommended Plan for Multiple Antipsychotic Therapies: And because Tarus has not been able to achieve symptoms control under an antipsychotic monotherapy, he is currently receiving and being discharged on 2 separate antipsychotic medications (Abilify & & Olanzapine Zydis) which seem effective in controlling his symptoms at this time. It will benefit patient to continue on these combination antipsychotic therapies as recommended. However, as his symptoms continue to improve, patient may be titrated down to an antipsychotic monotherapy. This is to decrease the chances for development of metabolic syndrome & other related adverse effects associated with use of multiple antipsychotic therapies. This has to be done within the discretion & proper judgement of his outpatient psychiatric provider.   Allergies as of 03/31/2017  Reactions   Amoxicillin Other (See Comments)   Unknown reaction when he was young   Pork-derived Products Other (See Comments)   UNKNOWN      Medication List    STOP taking these medications   lithium carbonate 450 MG CR tablet Commonly known as:  ESKALITH     TAKE these medications     Indication  ARIPiprazole 20 MG tablet Commonly known as:  ABILIFY Take 1 tablet (20 mg total) by mouth at bedtime. For mood control  Indication:  Mood control   divalproex 250 MG DR tablet Commonly known as:  DEPAKOTE Take 3 tablets (750 mg total) by mouth at bedtime. For mood stabilization  Indication:  Mood stabilization   divalproex 500 MG DR tablet Commonly known as:  DEPAKOTE Take 1 tablet (500 mg total) by mouth every morning. For mood stabilization Start taking on:  04/01/2017  Indication:  Mood stabilization   hydrOXYzine 50 MG tablet Commonly known as:   ATARAX/VISTARIL Take 1 tablet (50 mg total) by mouth every 6 (six) hours as needed for anxiety.  Indication:  Feeling Anxious   nicotine polacrilex 2 MG gum Commonly known as:  NICORETTE Take 1 each (2 mg total) by mouth as needed for smoking cessation.  Indication:  Nicotine Addiction   OLANZapine zydis 5 MG disintegrating tablet Commonly known as:  ZYPREXA Take 1 tablet (5 mg total) by mouth 2 (two) times daily. For mood control  Indication:  Mood control      Follow-up Information    Services, [facility] Follow up.   Contact information: [address] [place] [phone]          Follow-up recommendations:  Activity:  As tolerated Diet: As recommended by your primary care doctor. Keep all scheduled follow-up appointments as recommended.  Comments: Patient is instructed prior to discharge to: Take all medications as prescribed by his/her mental healthcare provider. Report any adverse effects and or reactions from the medicines to his/her outpatient provider promptly. Patient has been instructed & cautioned: To not engage in alcohol and or illegal drug use while on prescription medicines. In the event of worsening symptoms, patient is instructed to call the crisis hotline, 911 and or go to the nearest ED for appropriate evaluation and treatment of symptoms. To follow-up with his/her primary care provider for your other medical issues, concerns and or health care needs.   Signed: Armandina Stammer, NP, PMHNP, FNP-BC. 03/31/2017, 10:26 AM

## 2017-03-31 NOTE — Progress Notes (Signed)
Recreation Therapy Notes  Date: 03/31/17 Time: 0950 Location: 500 [address] Dayroom  Group Topic: Wellness  Goal Area(s) Addresses:  Patient will define components of whole wellness. Patient will verbalize benefit of whole wellness.  Behavioral Response: Minimal  Intervention: Music  Activity: Exercise.  LRT went over the importance of exercise and physical activity with patients.  LRT played music and lead patients through a series of stretches before going into more extensive exercises (squats, wall push ups, seated crunches, jogging in place, etc).  Education: Wellness, Building control surveyorDischarge Planning.   Education Outcome: Acknowledges education/In group clarification offered/Needs additional education.   Clinical Observations/Feedback: Pt stated exercises help with "cholesteral, blood pressure and other medical stuff".  Pt was in and out of group.  Pt did complete some push ups.      Caroll RancherMarjette Celestina Gironda, LRT/CTRS    Lillia AbedLindsay, Blondie Riggsbee A 03/31/2017 11:00 AM

## 2017-03-31 NOTE — Progress Notes (Signed)
D: Pt continues to ruminate on an mht that previously worked the unit. Pt informed the writer that his unit restriction had been lifted and he was allowed to go to the gym.  Writer encouraged pt to continue to improve, so that he'll continue to be allowed to leave the unit for meals and recreation. Pt has no questions or concerns.    A:  Support and encouragement was offered. 15 min checks continued for safety.  R: Pt remains safe.

## 2017-03-31 NOTE — [facility] Suicide Risk Assessment (Signed)
[facility]   Principal Problem: Bipolar disorder, unspecified [facility](HCC) Discharge Diagnoses:  Patient Active Problem List   Diagnosis Date Noted  . Psychosis (HCC) [F29] 03/23/2017  . Alcohol use [Z78.9] 02/17/2017  . Bipolar disorder, unspecified (HCC) [F31.9] 06/09/2010  Patient is a 37 year old African-American male diagnosed with schizo affective disorder who was transferred on an IVC petition done by his wife due to worsening of symptoms of agitation and disorganization.  Patient was noted to be talking to himself, placing objects in the driveway and boarding trash all over the yard.  Patient also made statements that he was a Research scientist (physical sciences)superstar celebrity.  Patient seen this morning, patient states that he is getting agitated in the hospital as they do 15-minute checks at night and so he is unable to sleep.  He states that he is doing much better, wants to go home so he can sleep at night without someone coming into his room to check on him.  He states that he has been doing much better in groups for the past 2 days, gets upset when he feels people are not listening to him.  He states that he is eating fine, has no problems in falling asleep but does get his sleep disrupted as he is not able to tolerate people coming in and out of his room all night.  Patient states that he does not think he has any special powers, is okay with taking his medications and [facility]  Patient denies any hallucinations, any paranoia.  He has that he feels he is back to his baseline, just does not like people telling him what to do.  Patient denies any thoughts of hurting himself or others.  Adds that he can return back home.  Total Time spent with patient: 30 minutes  Musculoskeletal: Strength & Muscle Tone: within normal limits Gait & Station: normal Patient leans: N/A  Psychiatric Specialty Exam: Review of Systems  Constitutional: Negative.  Negative for fever and  malaise/fatigue.  HENT: Negative.  Negative for congestion and sore throat.   Eyes: Negative.  Negative for blurred vision, discharge and redness.  Respiratory: Negative.  Negative for cough, shortness of breath and wheezing.   Cardiovascular: Negative.  Negative for chest pain and palpitations.  Gastrointestinal: Negative.  Negative for abdominal pain, constipation, diarrhea, heartburn, nausea and vomiting.  Musculoskeletal: Negative.  Negative for falls, joint pain and myalgias.  Skin: Negative.  Negative for rash.  Neurological: Negative for dizziness, seizures, loss of consciousness, weakness and headaches.  Endo/Heme/Allergies: Negative.  Negative for environmental allergies.  Psychiatric/Behavioral: Negative.  Negative for depression, hallucinations, memory loss, substance abuse and suicidal ideas. The patient is not nervous/anxious and does not have insomnia.     Blood pressure 117/71, pulse (!) 108, temperature 98.1 F (36.7 C), temperature source Oral, resp. rate 18, height 5' 8.75" (1.746 m), weight 95.3 kg (210 lb).Body mass index is 31.24 kg/m.  General Appearance: Casual  Eye Contact::  Good  Speech:  Clear and Coherent and Normal Rate409  Volume:  Increased  Mood:  Irritable  Affect:  Appropriate, Congruent and Full Range  Thought Process:  Coherent, Goal Directed and Descriptions of Associations: Intact  Orientation:  Full (Time, Place, and Person)  Thought Content:  WDL and Logical  Suicidal Thoughts:  No  Homicidal Thoughts:  No  Memory:  Immediate;   Fair Recent;   Fair Remote;   Fair  Judgement:  Impaired  Insight:  Shallow  Psychomotor Activity:  Normal  Concentration:  Fair  Recall:  [employer] of Knowledge:Fair  Language: Fair  Akathisia:  No  Handed:  Right  AIMS (if indicated):     Assets:  Desire for Improvement Housing Physical Health Social Support  Sleep:  Number of Hours: 3.25  Cognition: WNL  ADL's:  Intact   Mental Status Per Nursing  Assessment::   On Admission:  NA  Demographic Factors:  Male  Loss Factors: Financial problems/change in socioeconomic status  Historical Factors: NA  Risk Reduction Factors:   Living with another person, especially a relative  Continued Clinical Symptoms:  Previous Psychiatric Diagnoses and Treatments  Cognitive Features That Contribute To Risk:  Closed-mindedness    Suicide Risk:  Minimal: No identifiable suicidal ideation.  Patients presenting with no risk factors but with morbid ruminations; may be classified as minimal risk based on the severity of the depressive symptoms  Follow-up Information    [facility] Follow up.   Contact information: [address] [place] [address] [phone]           Plan Of Care/Follow-up recommendations:  Activity:  As tolerated Diet:  Regular Other:  Keep follow-up appointments and take medications as prescribed  Discussed in length with patient the need for medication compliance, patient states that he understands he needs to take his medications regularly in order for him to be stable and to stay out of the hospital.  Also discussed coping skills, the need to keep his follow-up appointments at [name].  Nelly Rout, MD 03/31/2017, 10:41 AM

## 2017-03-31 NOTE — Plan of Care (Signed)
Pt engaged in leisure education, music and coping skills recreation therapy sessions in a calm and appropriate mood.  Caroll RancherMarjette Almena Hokenson, LRT/CTRS

## 2017-03-31 NOTE — Progress Notes (Signed)
D: Pt was given 50 mg of benadryl along with 50 mg of thorazine for increased agitation, threatening statements and posturing.

## 2017-03-31 NOTE — Progress Notes (Addendum)
Pt approach nurses station and stated "That white guy better stop opening my mother fucking door; How am I suppose to sleep. If he comes in, I'm going to break his fucking jaw". Pt is currently agitated/irritable/cussing loudly at staff. Pt states "That bitch better stop typing on that computer and making lies about me". Pt states "Ya'll better stop being pussies; I am voluntarily committed and I can leave". Pt is currently refusing to sleep and is pacing in hallway. Speech is tangential. Pt is verbally threatening to hurt staff. Poor boundaries and impulse control.

## 2017-03-31 NOTE — Progress Notes (Signed)
Pt continues on with tangential speech at nurses station. Pt states "If you open my door; I swear. That's not a threat, that's a promise".

## 2017-03-31 NOTE — Progress Notes (Signed)
Pt approach MHT while staff member was doing Q 15 checks in a jolting motion. Pt states "You pussy boy. I ain't scared of you". Pt states "I can't sleep. That black girl started hollering". All staff members, security, provider on call, A.C., and charge nurse notified. Show of support at this time. Pt remains loud/agitated/angry. New orders obtained. See MAR.

## 2017-03-31 NOTE — Progress Notes (Signed)
  [facility] :  Will you be returning to the same living situation after discharge:  Yes,  home with parents At discharge, do you have transportation home?: Yes,  [place] where father will pick up Do you have the ability to pay for your medications: Yes,  MCD  Release of information consent forms completed and in the chart;  Patient's signature needed at discharge.  Patient to Follow up at: Follow-up Information    [facility] Follow up.   Contact information: [address] [place] [id number] [phone]           Next level of care provider [facility] Link:no  Safety Planning and Suicide Prevention discussed: Yes,  yes  Have you used any form of tobacco in the last 30 days? (Cigarettes, Smokeless Tobacco, Cigars, and/or Pipes): Yes  Has patient been referred to the Quitline?: Patient refused referral  Patient has been referred for addiction treatment: N/A  Ida RogueRodney B Shruthi Northrup, LCSW 03/31/2017, 10:29 AM

## 2017-03-31 NOTE — Progress Notes (Signed)
Recreation Therapy Notes  INPATIENT RECREATION TR PLAN  Patient Details Name: Brad Moreno MRN: [id number] DOB: Feb 12, 1980 Today's Date: 03/31/2017  Rec Therapy Plan Is patient appropriate for Therapeutic Recreation?: Yes Treatment times per week: about 3 days Estimated Length of Stay: 5-7 days TR Treatment/Interventions: Group participation (Comment)  Discharge Criteria Pt will be discharged from therapy if:: Discharged Treatment plan/goals/alternatives discussed and agreed upon by:: Patient/family  Discharge Summary Short term goals set: Pt will engage in group in calm and appropriate mood x2 within 5 recreation therapy sessions. Short term goals met: Complete Progress toward goals comments: Groups attended Which groups?: Self-esteem, Wellness, Coping skills, Leisure education, Anger management Reason goals not met: None Therapeutic equipment acquired: N/A Reason patient discharged from therapy: Discharge from hospital Pt/family agrees with progress & goals achieved: Yes Date patient discharged from therapy: 03/31/17   Victorino Sparrow, LRT/CTRS   Ria Comment, [name] 03/31/2017, 11:14 AM

## 2017-03-31 NOTE — [facility] Group Notes (Deleted)
LCSW Group Therapy Note   03/31/2017 1:15pm   Type of Therapy and Topic:  Group Therapy:  Positive Affirmations   Participation Level:  Did Not Attend  Description of Group: This group addressed positive affirmation toward self and others. Patients went around the room and identified two positive things about themselves and two positive things about a peer in the room. Patients reflected on how it felt to share something positive with others, to identify positive things about themselves, and to hear positive things from others. Patients were encouraged to have a daily reflection of positive characteristics or circumstances.  Therapeutic Goals 1. Patient will verbalize two of their positive qualities 2. Patient will demonstrate empathy for others by stating two positive qualities about a peer in the group 3. Patient will verbalize their feelings when voicing positive self affirmations and when voicing positive affirmations of others 4. Patients will discuss the potential positive impact on their wellness/recovery of focusing on positive traits of self and others. Summary of Patient Progress:    Therapeutic Modalities Cognitive Behavioral Therapy Motivational Interviewing  Tc Kapusta M Sakura Denis, Student-Social Work 03/31/2017 1:24 PM  

## 2017-03-31 NOTE — [facility] Group Notes (Signed)
LCSW Group Therapy Note   03/31/2017 1:15pm   Type of Therapy and Topic:  Group Therapy:  Positive Affirmations   Participation Level:  Minimal  Description of Group: This group addressed positive affirmation toward self and others. Patients went around the room and identified two positive things about themselves and two positive things about a peer in the room. Patients reflected on how it felt to share something positive with others, to identify positive things about themselves, and to hear positive things from others. Patients were encouraged to have a daily reflection of positive characteristics or circumstances.  Therapeutic Goals 1. Patient will verbalize two of their positive qualities 2. Patient will demonstrate empathy for others by stating two positive qualities about a peer in the group 3. Patient will verbalize their feelings when voicing positive self affirmations and when voicing positive affirmations of others 4. Patients will discuss the potential positive impact on their wellness/recovery of focusing on positive traits of self and others. Summary of Patient Progress:  Brad Moreno came to group but he came in late and did not stay for the entire group.  He was in and out of the room.  He appeared to be manic and responding to internal stimuli.  He interrupted the speaker several times to ask questions that were not related to the story that was being told.  His behavior today was drastically different than the previous day.    Therapeutic Modalities Cognitive Behavioral Therapy Motivational Interviewing  Carlynn Heraldngel M Laketia Vicknair, Student-Social Work 03/31/2017 1:39 PM

## 2017-03-31 NOTE — Progress Notes (Signed)
DAR NOTE: Patient presents with irritable affect and mood.  Patient continues to be verbally aggressive and abusive towards staff and peers on the unit.  Patient redirected several times on the unit but continues to be disruptive to the milieu.  Patient received Vistaril and Zyprexa for anxiety and agitation with fair effect.  Denies pain, auditory and visual hallucinations.  Described energy level as normal and concentration as good.  Rates depression at 5, hopelessness at 3, and anxiety at 1.  Maintained on routine safety checks.  Medications given as prescribed.  Support and encouragement offered as needed.  Attended group and participated.  Patient pacing the hallway singing out loud and being sexually inappropriate with staff and peers.  Patient's treatment team met with patient and discussed threatening phone calls which he made to his wife.  Patient denies calling wife but admitted to calling his father in-law to ask for the whereabout of his daughter. Verbalizes understanding and readiness for discharge.  Denies suicidal thoughts, auditory and visual hallucinations.

## 2017-04-30 ENCOUNTER — Encounter (HOSPITAL_COMMUNITY): Payer: Self-pay | Admitting: Emergency Medicine

## 2017-04-30 ENCOUNTER — Emergency Department (HOSPITAL_COMMUNITY)
Admission: EM | Admit: 2017-04-30 | Discharge: 2017-05-01 | Disposition: A | Discharge status: Other Acute Inpatient Hospital | Payer: Medicaid Other | Attending: Emergency Medicine | Admitting: Emergency Medicine

## 2017-04-30 ENCOUNTER — Other Ambulatory Visit: Payer: Self-pay

## 2017-04-30 DIAGNOSIS — F209 Schizophrenia, unspecified: Secondary | ICD-10-CM | POA: Insufficient documentation

## 2017-04-30 DIAGNOSIS — Z79899 Other long term (current) drug therapy: Secondary | ICD-10-CM | POA: Insufficient documentation

## 2017-04-30 DIAGNOSIS — F319 Bipolar disorder, unspecified: Secondary | ICD-10-CM | POA: Diagnosis not present

## 2017-04-30 DIAGNOSIS — Z046 Encounter for general psychiatric examination, requested by authority: Secondary | ICD-10-CM | POA: Diagnosis present

## 2017-04-30 LAB — CBC WITH DIFFERENTIAL/PLATELET
Basophils Absolute: 0 10*3/uL (ref 0.0–0.1)
Basophils Relative: 0 %
EOS ABS: 0.2 10*3/uL (ref 0.0–0.7)
EOS PCT: 3 %
HCT: 43.5 % (ref 39.0–52.0)
Hemoglobin: 13.8 g/dL (ref 13.0–17.0)
LYMPHS ABS: 2.7 10*3/uL (ref 0.7–4.0)
Lymphocytes Relative: 39 %
MCH: 26.6 pg (ref 26.0–34.0)
MCHC: 31.7 g/dL (ref 30.0–36.0)
MCV: 84 fL (ref 78.0–100.0)
Monocytes Absolute: 0.4 10*3/uL (ref 0.1–1.0)
Monocytes Relative: 6 %
Neutro Abs: 3.6 10*3/uL (ref 1.7–7.7)
Neutrophils Relative %: 52 %
Platelets: 227 10*3/uL (ref 150–400)
RBC: 5.18 MIL/uL (ref 4.22–5.81)
RDW: 14.3 % (ref 11.5–15.5)
WBC: 6.8 10*3/uL (ref 4.0–10.5)

## 2017-04-30 LAB — COMPREHENSIVE METABOLIC PANEL
ALBUMIN: 3.8 g/dL (ref 3.5–5.0)
ALT: 28 U/L (ref 17–63)
AST: 26 U/L (ref 15–41)
Alkaline Phosphatase: 72 U/L (ref 38–126)
Anion gap: 10 (ref 5–15)
BUN: 12 mg/dL (ref 6–20)
CHLORIDE: 104 mmol/L (ref 101–111)
CO2: 25 mmol/L (ref 22–32)
CREATININE: 1 mg/dL (ref 0.61–1.24)
Calcium: 9 mg/dL (ref 8.9–10.3)
GFR calc Af Amer: >60 mL/min (ref >60)
GLUCOSE: 110 mg/dL — AB (ref 65–99)
Potassium: 3.9 mmol/L (ref 3.5–5.1)
Sodium: 139 mmol/L (ref 135–145)
Total Bilirubin: 0.3 mg/dL (ref 0.3–1.2)
Total Protein: 7.1 g/dL (ref 6.5–8.1)

## 2017-04-30 LAB — RAPID URINE DRUG SCREEN, HOSP PERFORMED
AMPHETAMINES: NOT DETECTED
Barbiturates: NOT DETECTED
Benzodiazepines: NOT DETECTED
Cocaine: NOT DETECTED
OPIATES: NOT DETECTED
Tetrahydrocannabinol: NOT DETECTED

## 2017-04-30 LAB — ETHANOL

## 2017-04-30 MED ORDER — OLANZAPINE 5 MG PO TBDP
5.0000 mg | ORAL_TABLET | Freq: Two times a day (BID) | ORAL | Status: DC
  Administered 2017-04-30 – 2017-05-01 (×2): 5 mg via ORAL
  Filled 2017-04-30 (×2): qty 1

## 2017-04-30 MED ORDER — ARIPIPRAZOLE 10 MG PO TABS
20.0000 mg | ORAL_TABLET | Freq: Every day | ORAL | Status: DC
  Administered 2017-04-30: 20 mg via ORAL
  Filled 2017-04-30 (×3): qty 2

## 2017-04-30 MED ORDER — NICOTINE 21 MG/24HR TD PT24: 21.0000 mg | MEDICATED_PATCH | Freq: Every day | TRANSDERMAL | Status: DC

## 2017-04-30 MED ORDER — HYDROXYZINE HCL 25 MG PO TABS
50.0000 mg | ORAL_TABLET | Freq: Four times a day (QID) | ORAL | Status: DC | PRN
Start: 2017-04-30 — End: 2017-05-01

## 2017-04-30 MED ORDER — ZIPRASIDONE MESYLATE 20 MG IM SOLR
20.0000 mg | Freq: Once | INTRAMUSCULAR | Status: AC
  Administered 2017-04-30: 20 mg via INTRAMUSCULAR
  Filled 2017-04-30: qty 20

## 2017-04-30 MED ORDER — ZIPRASIDONE MESYLATE 20 MG IM SOLR: 20.0000 mg | Freq: Two times a day (BID) | INTRAMUSCULAR | Status: DC | PRN

## 2017-04-30 MED ORDER — DIVALPROEX SODIUM 250 MG PO DR TAB
500.0000 mg | DELAYED_RELEASE_TABLET | ORAL | Status: DC
  Administered 2017-05-01: 500 mg via ORAL
  Filled 2017-04-30: qty 2

## 2017-04-30 MED ORDER — NICOTINE 21 MG/24HR TD PT24: 21.0000 mg | MEDICATED_PATCH | Freq: Every day | TRANSDERMAL | Status: DC | PRN

## 2017-04-30 MED ORDER — DIVALPROEX SODIUM 250 MG PO DR TAB
750.0000 mg | DELAYED_RELEASE_TABLET | Freq: Every day | ORAL | Status: DC
  Administered 2017-04-30: 750 mg via ORAL
  Filled 2017-04-30: qty 3

## 2017-04-30 MED ORDER — STERILE WATER FOR INJECTION IJ SOLN
INTRAMUSCULAR | Status: AC
  Administered 2017-04-30: 1.2 mL
  Filled 2017-04-30: qty 10

## 2017-04-30 MED ORDER — LORAZEPAM 1 MG PO TABS: 1.0000 mg | ORAL_TABLET | ORAL | Status: DC | PRN

## 2017-04-30 NOTE — ED Notes (Signed)
Patient acting out security called

## 2017-04-30 NOTE — ED Notes (Signed)
Ambulated patient to the bathroom and back.

## 2017-04-30 NOTE — ED Provider Notes (Signed)
[facility] EMERGENCY DEPARTMENT Provider Note   CSN: [id number] Arrival date & time: 04/30/17  1604     History   Chief Complaint Chief Complaint  Patient presents with  . V70.1    HPI Brad Moreno is a 37 y.o. male.  The history is provided by the patient and the police. The history is limited by the condition of the patient (minimally answering questions).  Pt was seen at 1635.  Per Police and IVC paperwork:  Pt brought to the ED by Police under IVC taken out by family. Family states pt "refuses to take his medication." "Pt challenged his 14yo son to go outside stating that he would kill him." "Neighbors witnessed pt jumping out in front of cars on [address]." Pt himself will only minimally answer questions and is not forthcoming regarding any information.    Past Medical History:  Diagnosis Date  . Bipolar 1 disorder (HCC)   . Depression 06/2010  . Schizophrenia [facility](HCC)     Patient Active Problem List   Diagnosis Date Noted  . Psychosis (HCC) 03/23/2017  . Alcohol use 02/17/2017  . Bipolar disorder, unspecified (HCC) 06/09/2010    History reviewed. No pertinent surgical history.      Home Medications    Prior to Admission medications   Medication Sig Start Date End Date Taking? Authorizing Provider  ARIPiprazole (ABILIFY) 20 MG tablet Take 1 tablet (20 mg total) by mouth at bedtime. For mood control 03/31/17   Armandina StammerNwoko, Agnes I, NP  divalproex (DEPAKOTE) 250 MG DR tablet Take 3 tablets (750 mg total) by mouth at bedtime. For mood stabilization 03/31/17   Armandina StammerNwoko, Agnes I, NP  divalproex (DEPAKOTE) 500 MG DR tablet Take 1 tablet (500 mg total) by mouth every morning. For mood stabilization 04/01/17   Armandina StammerNwoko, Agnes I, NP  hydrOXYzine (ATARAX/VISTARIL) 50 MG tablet Take 1 tablet (50 mg total) by mouth every 6 (six) hours as needed for anxiety. 03/31/17   Armandina StammerNwoko, Agnes I, NP  nicotine polacrilex (NICORETTE) 2 MG gum Take 1 each (2 mg total) by mouth as needed for smoking  cessation. 03/31/17   Armandina StammerNwoko, Agnes I, NP  OLANZapine zydis (ZYPREXA) 5 MG disintegrating tablet Take 1 tablet (5 mg total) by mouth 2 (two) times daily. For mood control 03/31/17   Sanjuana KavaNwoko, Agnes I, NP    Family History Family History  Problem Relation Age of Onset  . Depression Mother   . Diabetes Mother   . Cancer Paternal Grandfather 1365       Colon Cancer  . Alcohol abuse Brother        Was Heavy drinker and smoker--has quit    Social History Social History   Tobacco Use  . Smoking status: Former Smoker    Packs/day: 0.50  . Smokeless tobacco: Never Used  Substance Use Topics  . Alcohol use: Yes    Alcohol/week: 1.8 oz    Types: 3 Cans of beer per week  . Drug use: Yes    Types: Marijuana     Allergies   Amoxicillin and Pork-derived products   Review of Systems Review of Systems  Unable to perform ROS: Psychiatric disorder     Physical Exam Updated Vital Signs BP (!) 152/96 (BP Location: Right Arm)   Pulse (!) 109   Temp 98.4 F (36.9 C) (Oral)   Resp 18   Wt 95.3 kg (210 lb)   SpO2 100%   BMI 31.24 kg/m   Physical Exam 1640: Physical examination:  Nursing notes reviewed; Vital signs and O2 SAT reviewed;  Constitutional: Well developed, Well nourished, Well hydrated, In no acute distress; Head:  Normocephalic, atraumatic; Eyes: EOMI, PERRL, No scleral icterus; ENMT: Mouth and pharynx normal, Mucous membranes moist; Neck: Supple, Full range of motion; Cardiovascular: Regular rate and rhythm; Respiratory: Breath sounds clear, No wheezes.  Speaking full sentences with ease, Normal respiratory effort/excursion; Chest: No deformity, Movement normal; Abdomen: Nondistended; Extremities: No deformity.; Neuro: AA&Ox3, Major CN grossly intact.  Speech clear. No gross focal motor deficits in extremities. Climbs on and off stretcher easily by himself. Gait steady.; Skin: Color normal, Warm, Dry.; Psych:  Easily agitated.     ED Treatments / Results  Labs (all labs  ordered are listed, but only abnormal results are displayed)   EKG None  Radiology   Procedures Procedures (including critical care time)  Medications Ordered in ED Medications  ARIPiprazole (ABILIFY) tablet 20 mg (has no administration in time range)  divalproex (DEPAKOTE) DR tablet 750 mg (has no administration in time range)  divalproex (DEPAKOTE) DR tablet 500 mg (has no administration in time range)  hydrOXYzine (ATARAX/VISTARIL) tablet 50 mg (has no administration in time range)  OLANZapine zydis (ZYPREXA) disintegrating tablet 5 mg (has no administration in time range)  nicotine (NICODERM CQ - dosed in mg/24 hours) patch 21 mg (has no administration in time range)     Initial Impression / Assessment and Plan / ED Course  I have reviewed the triage vital signs and the nursing notes.  Pertinent labs & imaging results that were available during my care of the patient were reviewed by me and considered in my medical decision making (see chart for details).  MDM Reviewed: previous chart, nursing note and vitals Reviewed previous: labs Interpretation: labs   Results for orders placed or performed during the hospital encounter of 04/30/17  Comprehensive metabolic panel  Result Value Ref Range   Sodium 139 135 - 145 mmol/L   Potassium 3.9 3.5 - 5.1 mmol/L   Chloride 104 101 - 111 mmol/L   CO2 25 22 - 32 mmol/L   Glucose, Bld 110 (H) 65 - 99 mg/dL   BUN 12 6 - 20 mg/dL   Creatinine, Ser 1.61 0.61 - 1.24 mg/dL   Calcium 9.0 8.9 - 09.6 mg/dL   Total Protein 7.1 6.5 - 8.1 g/dL   Albumin 3.8 3.5 - 5.0 g/dL   AST 26 15 - 41 U/L   ALT 28 17 - 63 U/L   Alkaline Phosphatase 72 38 - 126 U/L   Total Bilirubin 0.3 0.3 - 1.2 mg/dL   GFR calc non Af Amer >60 >60 mL/min   GFR calc Af Amer >60 >60 mL/min   Anion gap 10 5 - 15  Ethanol  Result Value Ref Range   Alcohol, Ethyl (B) <10 <10 mg/dL  CBC with Differential  Result Value Ref Range   WBC 6.8 4.0 - 10.5 K/uL   RBC  5.18 4.22 - 5.81 MIL/uL   Hemoglobin 13.8 13.0 - 17.0 g/dL   HCT 04.5 40.9 - 81.1 %   MCV 84.0 78.0 - 100.0 fL   MCH 26.6 26.0 - 34.0 pg   MCHC 31.7 30.0 - 36.0 g/dL   RDW 91.4 78.2 - 95.6 %   Platelets 227 150 - 400 K/uL   Neutrophils Relative % 52 %   Neutro Abs 3.6 1.7 - 7.7 K/uL   Lymphocytes Relative 39 %   Lymphs Abs 2.7 0.7 - 4.0 K/uL  Monocytes Relative 6 %   Monocytes Absolute 0.4 0.1 - 1.0 K/uL   Eosinophils Relative 3 %   Eosinophils Absolute 0.2 0.0 - 0.7 K/uL   Basophils Relative 0 %   Basophils Absolute 0.0 0.0 - 0.1 K/uL  Urine rapid drug screen (hosp performed)  Result Value Ref Range   Opiates NONE DETECTED NONE DETECTED   Cocaine NONE DETECTED NONE DETECTED   Benzodiazepines NONE DETECTED NONE DETECTED   Amphetamines NONE DETECTED NONE DETECTED   Tetrahydrocannabinol NONE DETECTED NONE DETECTED   Barbiturates NONE DETECTED NONE DETECTED    1725:  Will have TTS evaluate. Holding orders written.   2000:  TTS has evaluated pt: recommends inpt treatment.     Final Clinical Impressions(s) / ED Diagnoses   Final diagnoses:  None    ED Discharge Orders    None       Samuel Jester, DO 04/30/17 2225

## 2017-04-30 NOTE — ED Notes (Signed)
Removed tray from patients room he finished his whole meal. Kept his tea in the room

## 2017-04-30 NOTE — ED Notes (Signed)
Patient taking scrubbs off saying they are to big they make him look fat gave him a smaller size. Patient is pacing around the room.

## 2017-04-30 NOTE — ED Notes (Signed)
Patient very agitated and talking to himself.

## 2017-04-30 NOTE — ED Notes (Signed)
Ambulated patient to the bathroom

## 2017-04-30 NOTE — ED Notes (Signed)
Patient started TTS assesment

## 2017-04-30 NOTE — ED Notes (Signed)
Officer signed off by nurse and approved by [employer] nurse [employer]

## 2017-04-30 NOTE — BH Assessment (Addendum)
Tele Assessment Note   Patient Name: Brad Moreno MRN: [id number] Referring Physician: Dr Clarene Duke  Location of [facility]: [facility] Location of Provider: Behavioral Health TTS Department  Brad Moreno is an 37 y.o. male. Patient presents under involuntary commitment taken out by his mother Brad Moreno. Per IVC, pt has been refusing his medications. He threatened to kill his 16 yo son and neighbors witnessed patient jumping in front of cars on [address]. Patient is irritable and a poor historian during teleassessment as he does not answer some questions. Per chart review, pt was discharged from [facility] on 03/31/17 after admission for psychosis. Pt is oriented to self, place and date but not situation. He appears paranoid as he thinks Clinical research associate has his house under surveillance. When asked if he was was jumping in front of cars, pt replies, "Check the (surveillance) footage." He reports insomnia and poor appetite. Pt says he does not like to eat because he is "fat". Pt reports he has songs on Youtube. Pt cursing at Clinical research associate. He is able to be redirected at start of assessment, but pt continues to say that Clinical research associate and writer's boss have been watching patient's house since patient was born.  He contradicts statements he made minutes earlier. Pt has been inpatient at [facility], [facility] and [facility]. Pt tells writer not to take "fake charges" out of him. He says he has never been on a plane, so Clinical research associate can fly him to [place] or [place].   Diagnosis: Schizophrenia  Past Medical History:  Past Medical History:  Diagnosis Date  . Bipolar 1 disorder (HCC)   . Depression 06/2010  . Schizophrenia (HCC)     History reviewed. No pertinent surgical history.  Family History:  Family History  Problem Relation Age of Onset  . Depression Mother   . Diabetes Mother   . Cancer Paternal Grandfather 58       Colon Cancer  . Alcohol abuse Brother        Was Heavy drinker and smoker--has quit    Social  History:  reports that he has quit smoking. He smoked 0.50 packs per day. He has never used smokeless tobacco. He reports that he drinks about 1.8 oz of alcohol per week. He reports that he has current or past drug history. Drug: Marijuana.  Additional Social History:  Alcohol / Drug Use Pain Medications: pt denies abuse - see pta meds list Prescriptions: pt denies abuse - see pta meds list Over the Counter: pt denies abuse - see pta meds list History of alcohol / drug use?: No history of alcohol / drug abuse  CIWA: CIWA-Ar BP: (!) 152/96 Pulse Rate: (!) 109 COWS:    Allergies:  Allergies  Allergen Reactions  . Amoxicillin Other (See Comments)    Unknown reaction when he was young  . Pork-Derived Products Other (See Comments)    UNKNOWN    Home Medications:  (Not in a hospital admission)  OB/GYN Status:  No LMP for male patient.  General Assessment Data Location of Assessment: [facility] TTS Assessment: In system Is this a Tele or Face-to-Face Assessment?: Tele Assessment Is this an Initial Assessment or a Re-assessment for this encounter?: Initial Assessment Marital status: Separated Maiden name: treyon wymore Is patient pregnant?: No Pregnancy Status: No Living Arrangements: Parent, Children(mom, dad, 61 yo son, 75 yo daughter) Can pt return to current living arrangement?: Yes Admission Status: Involuntary Is patient capable of signing voluntary admission?: No Referral Source: Self/Family/Friend Insurance type: medicaid  Crisis Care Plan Living Arrangements: Parent, Children(mom, dad, 103 yo son, 59 yo daughter) Armed forces operational officer Guardian: (himself) Name of Psychiatrist: Delice Bison in [place] Name of Therapist: none  Education Status Is patient currently in school?: No Is the patient employed, unemployed or receiving disability?: (unable to assess)  Risk to self with the past 6 months Suicidal Ideation: No Has patient been a risk to self within the past 6 months prior  to admission? : No Suicidal Intent: No Has patient had any suicidal intent within the past 6 months prior to admission? : No Is patient at risk for suicide?: Yes Suicidal Plan?: No Has patient had any suicidal plan within the past 6 months prior to admission? : No Access to Means: Yes(per IVC, pt jumping in front of cars on [address]) What has been your use of drugs/alcohol within the last 12 months?: none Previous Attempts/Gestures: No How many times?: 0 Other Self Harm Risks: pt denies Triggers for Past Attempts: (n/a) Intentional Self Injurious Behavior: None Family Suicide History: Unknown Recent stressful life event(s): (unable to assess) Persecutory voices/beliefs?: Yes Depression: No Depression Symptoms: Feeling angry/irritable, Insomnia Substance abuse history and/or treatment for substance abuse?: No Suicide prevention information given to non-admitted patients: Not applicable  Risk to Others within the past 6 months Homicidal Ideation: No Does patient have any lifetime risk of violence toward others beyond the six months prior to admission? : No Thoughts of Harm to Others: No Current Homicidal Intent: No Current Homicidal Plan: No Access to Homicidal Means: No Identified Victim: none History of harm to others?: No Assessment of Violence: None Noted Violent Behavior Description: pt denies history of violence(per IVC, pt threatened his son) Does patient have access to weapons?: No Criminal Charges Pending?: No Does patient have a court date: No Is patient on probation?: No  Psychosis Hallucinations: None noted Delusions: Persecutory, Grandiose  Mental Status Report Appearance/Hygiene: Unremarkable, In scrubs Eye Contact: Fair Motor Activity: Freedom of movement, Restlessness Speech: Logical/coherent, Tangential(able to redirected at first) Level of Consciousness: Alert, Irritable Mood: (unable to assess) Affect: Anxious, Irritable Anxiety Level:  Moderate Thought Processes: Relevant, Coherent, Tangential Judgement: Impaired Orientation: Person, Place, Time Obsessive Compulsive Thoughts/Behaviors: None  Cognitive Functioning Concentration: Unable to Assess Memory: Unable to Assess Is patient IDD: No Is patient DD?: No Insight: Poor Impulse Control: Poor Appetite: Fair Have you had any weight changes? : (pt reports not eating because he is fat) Sleep: Decreased Vegetative Symptoms: Unable to Assess  ADLScreening [facility] Assessment Services) Patient's cognitive ability adequate to safely complete daily activities?: Yes Patient able to express need for assistance with ADLs?: Yes Independently performs ADLs?: Yes (appropriate for developmental age)  Prior Inpatient Therapy Prior Inpatient Therapy: Yes Prior Therapy Dates: 2012, 2019 Prior Therapy Facilty/Provider(s): [place], [place], [facility] [facility] Reason for Treatment: psychosis  Prior Outpatient Therapy Prior Outpatient Therapy: Yes Prior Therapy Dates: currently Prior Therapy Facilty/Provider(s): Dr Geanie Cooley in [place] Reason for Treatment: psychosis Does patient have an ACCT team?: No Does patient have Intensive In-House Services?  : No Does patient have [facility] services? : No Does patient have P4CC services?: No  ADL Screening (condition at time of admission) Patient's cognitive ability adequate to safely complete daily activities?: Yes Is the patient deaf or have difficulty hearing?: No Does the patient have difficulty seeing, even when wearing glasses/contacts?: No Does the patient have difficulty concentrating, remembering, or making decisions?: Yes Patient able to express need for assistance with ADLs?: Yes Does the patient have difficulty dressing or bathing?:  No Independently performs ADLs?: Yes (appropriate for developmental age) Does the patient have difficulty walking or climbing stairs?: No Weakness of Legs: None Weakness of Arms/Hands: None  Home  Assistive Devices/Equipment Home Assistive Devices/Equipment: None    Abuse/Neglect Assessment (Assessment to be complete while patient is alone) Abuse/Neglect Assessment Can Be Completed: Yes Physical Abuse: Denies Verbal Abuse: Denies Sexual Abuse: Denies Exploitation of patient/patient's resources: Denies Self-Neglect: Denies     Merchant navy officerAdvance Directives (For Healthcare) Does Patient Have a Medical Advance Directive?: No Would patient like information on creating a medical advance directive?: No - Patient declined          Disposition:  Disposition Initial Assessment Completed for this Encounter: Yes Disposition of Patient: Admit Type of inpatient treatment program: Adult(tina okonkwo NP recommends inpatient treatment)   Leighton Ruffina Okonkwo NP recommends inpatient treatment for patient. He [facility] be [facility] 500 hall [facility], however, there are currently no available beds on that hall. TTS will seek inpatient placement. Writer notified pt's RN who will tell EDP the disposition recommendation.   This service was provided via telemedicine using a 2-way, interactive audio and video technology.  Names of all persons participating in this telemedicine service and their role in this encounter. Name: Fernande BoydenJONATHAN Gruwell patient  Evette Cristalaroline Paige Selma Mink TTS counselor          Thornell SartoriusMCLEAN, Evalyn Shultis P 04/30/2017 6:46 PM

## 2017-04-30 NOTE — ED Notes (Signed)
Patient is asleep and resting comfortably.  

## 2017-04-30 NOTE — ED Notes (Signed)
Pt ambulatory to bathroom and back to room escorted by this nurse. Pt back to bed. Lights dimmed for comfort.

## 2017-04-30 NOTE — ED Notes (Signed)
Officer informed patient that when he is discharged they will transport him back home.

## 2017-04-30 NOTE — ED Triage Notes (Signed)
Patient is brought in by law enforcement for IVC. Papers taken out by mother.

## 2017-04-30 NOTE — ED Notes (Signed)
Patient ask to change the channel got the remote let him choose a new station then removed the remote.

## 2017-04-30 NOTE — ED Notes (Signed)
Lab in room drawing blood  

## 2017-04-30 NOTE — ED Notes (Signed)
Nurse gave patient crackers and peanut butter.

## 2017-04-30 NOTE — ED Notes (Signed)
Pharmacy came to update patients med, I asked him if it was ok for him to speak with her he became very verbal and loud.

## 2017-04-30 NOTE — ED Notes (Signed)
[employer]SD (2 officers at bedside)

## 2017-04-30 NOTE — ED Notes (Signed)
Ambulated patient to bathroom and back 

## 2017-04-30 NOTE — ED Notes (Signed)
Pt cussing staff, standing in doorway, being verbally abusive. Dr Clarene DukeMcManus made aware. RSD called. Hospital security at bedside.

## 2017-04-30 NOTE — ED Notes (Addendum)
[employer] officers are no longer here observing pt.

## 2017-04-30 NOTE — ED Notes (Signed)
Patient given 7up by officer

## 2017-04-30 NOTE — ED Notes (Signed)
Gave patient a meal tray removed knife and fork from tray

## 2017-04-30 NOTE — ED Notes (Signed)
Patient completed TTS, ambulated to the bathroom and back

## 2017-05-01 NOTE — BHH Counselor (Signed)
Pt was reassessed this morning.  As reassessment began, he became increasingly agitated.  When asked why he was at the hospital, Pt stated he did not know. Pt became abusive in language, stated that if he wanted to kill his son, his son would be dead.  Pt became increasingly agitated, concluded that he was ready to go to a mental health hospital.

## 2017-05-01 NOTE — ED Notes (Addendum)
[employer] called about needing a Neurosurgeonsheriffs deputy to transport [facility] to [facility]. Dispatch is Facilities managersending deputy.

## 2017-05-01 NOTE — ED Notes (Signed)
Breakfast try given.

## 2017-05-01 NOTE — ED Notes (Signed)
Ambulated patient to the bathroom and back.

## 2017-05-01 NOTE — ED Notes (Signed)
Patient is pacing in room and laughing very loud at door way. Patient seems agitated. Sitter reports that patient is agitated and hitting his fist.  [place] police was called.

## 2017-05-01 NOTE — ED Provider Notes (Signed)
The patient has been stable throughout his emergency department stay with regards to vital signs and behavior, he has been accepted [facility] by a psychiatrist.  M tele-form completed at 11:20 AM   Eber HongMiller, Rahaf Carbonell, MD 05/01/17 1123

## 2017-05-01 NOTE — Progress Notes (Signed)
Patient has been accepted to [facility].  The  bed is [place] [address]nd [place].  Accepting provider is Dr. Robet Leuhotakura. Patient can arrive anytime.  Number for report is [phone].   CSW has called and notified Mandy at [facility] and faxed [facility]C paperwork to [facility].   Moss McKy-Sha Miyoshi Ligas MSW, LCSW-A, LCAS-A Clinical Social Worker 05/01/2017 10:26 AM

## 2017-05-01 NOTE — ED Notes (Signed)
Ambulated patient to the bathroom and back. Set up remote so he could change the TV channel then removed remote.

## 2017-05-01 NOTE — Progress Notes (Signed)
Patient meets criteria for inpatient treatment. There are currently no appropriate beds available at [facility] per [place], [facility]. CSW faxed referrals to the following facilities for review. [place], [address], [place], [address] [facility], [place], [address] [place], [address], [facility], [place], [address] [place], [place], [place], [place], [address], [place], [address], [place], [facility], [place], [facility], [place], [facility] [place], [address], [facility], [place], [facility], [facility], [place] and [place].    TTS will continue to seek bed placement.     Moss McKy-sha Darcelle Herrada, MSW, LCSW-A, LCAS-A 05/01/2017 9:05 AM

## 2017-06-17 ENCOUNTER — Other Ambulatory Visit: Payer: Self-pay

## 2017-06-17 ENCOUNTER — Encounter (HOSPITAL_COMMUNITY): Payer: Self-pay | Admitting: *Deleted

## 2017-06-17 ENCOUNTER — Emergency Department (HOSPITAL_COMMUNITY)
Admission: EM | Admit: 2017-06-17 | Discharge: 2017-06-18 | Disposition: A | Discharge status: Other Acute Inpatient Hospital | Payer: Medicaid Other | Attending: Emergency Medicine | Admitting: Emergency Medicine

## 2017-06-17 DIAGNOSIS — F314 Bipolar disorder, current episode depressed, severe, without psychotic features: Secondary | ICD-10-CM | POA: Insufficient documentation

## 2017-06-17 DIAGNOSIS — Z046 Encounter for general psychiatric examination, requested by authority: Secondary | ICD-10-CM | POA: Insufficient documentation

## 2017-06-17 DIAGNOSIS — R45850 Homicidal ideations: Secondary | ICD-10-CM | POA: Insufficient documentation

## 2017-06-17 DIAGNOSIS — Z79899 Other long term (current) drug therapy: Secondary | ICD-10-CM | POA: Diagnosis not present

## 2017-06-17 DIAGNOSIS — R454 Irritability and anger: Secondary | ICD-10-CM | POA: Diagnosis present

## 2017-06-17 DIAGNOSIS — F17210 Nicotine dependence, cigarettes, uncomplicated: Secondary | ICD-10-CM | POA: Diagnosis not present

## 2017-06-17 LAB — CBC WITH DIFFERENTIAL/PLATELET
Basophils Absolute: 0 10*3/uL (ref 0.0–0.1)
Basophils Relative: 0 %
EOS ABS: 0.1 10*3/uL (ref 0.0–0.7)
EOS PCT: 2 %
HCT: 42.5 % (ref 39.0–52.0)
Hemoglobin: 13.8 g/dL (ref 13.0–17.0)
LYMPHS ABS: 2.5 10*3/uL (ref 0.7–4.0)
Lymphocytes Relative: 34 %
MCH: 26.7 pg (ref 26.0–34.0)
MCHC: 32.5 g/dL (ref 30.0–36.0)
MCV: 82.4 fL (ref 78.0–100.0)
MONO ABS: 0.4 10*3/uL (ref 0.1–1.0)
Monocytes Relative: 6 %
NEUTROS PCT: 58 %
Neutro Abs: 4.4 10*3/uL (ref 1.7–7.7)
PLATELETS: 208 10*3/uL (ref 150–400)
RBC: 5.16 MIL/uL (ref 4.22–5.81)
RDW: 13.5 % (ref 11.5–15.5)
WBC: 7.6 10*3/uL (ref 4.0–10.5)

## 2017-06-17 LAB — COMPREHENSIVE METABOLIC PANEL
ALT: 21 U/L (ref 17–63)
AST: 22 U/L (ref 15–41)
Albumin: 4.2 g/dL (ref 3.5–5.0)
Alkaline Phosphatase: 65 U/L (ref 38–126)
Anion gap: 9 (ref 5–15)
BUN: 12 mg/dL (ref 6–20)
CHLORIDE: 102 mmol/L (ref 101–111)
CO2: 25 mmol/L (ref 22–32)
Calcium: 9.1 mg/dL (ref 8.9–10.3)
Creatinine, Ser: 1.07 mg/dL (ref 0.61–1.24)
Glucose, Bld: 94 mg/dL (ref 65–99)
POTASSIUM: 3.8 mmol/L (ref 3.5–5.1)
Sodium: 136 mmol/L (ref 135–145)
Total Bilirubin: 0.8 mg/dL (ref 0.3–1.2)
Total Protein: 7.3 g/dL (ref 6.5–8.1)

## 2017-06-17 LAB — RAPID URINE DRUG SCREEN, HOSP PERFORMED
AMPHETAMINES: NOT DETECTED
BENZODIAZEPINES: NOT DETECTED
Barbiturates: NOT DETECTED
Cocaine: POSITIVE — AB
OPIATES: NOT DETECTED
Tetrahydrocannabinol: NOT DETECTED

## 2017-06-17 LAB — ETHANOL

## 2017-06-17 MED ORDER — ZIPRASIDONE HCL 20 MG PO CAPS
20.0000 mg | ORAL_CAPSULE | Freq: Two times a day (BID) | ORAL | Status: DC
  Administered 2017-06-17 – 2017-06-18 (×2): 20 mg via ORAL
  Filled 2017-06-17 (×2): qty 1

## 2017-06-17 MED ORDER — IBUPROFEN 400 MG PO TABS: 600.0000 mg | ORAL_TABLET | Freq: Three times a day (TID) | ORAL | Status: DC | PRN

## 2017-06-17 MED ORDER — HYDROXYZINE HCL 25 MG PO TABS: 25.0000 mg | ORAL_TABLET | Freq: Four times a day (QID) | ORAL | Status: DC | PRN

## 2017-06-17 MED ORDER — CLONIDINE HCL 0.1 MG PO TABS
0.1000 mg | ORAL_TABLET | Freq: Four times a day (QID) | ORAL | Status: DC
  Administered 2017-06-17 (×2): 0.1 mg via ORAL
  Filled 2017-06-17 (×2): qty 1

## 2017-06-17 MED ORDER — LOPERAMIDE HCL 2 MG PO CAPS
2.0000 mg | ORAL_CAPSULE | ORAL | Status: DC | PRN
Start: 2017-06-17 — End: 2017-06-18

## 2017-06-17 MED ORDER — LORAZEPAM 1 MG PO TABS
1.0000 mg | ORAL_TABLET | Freq: Once | ORAL | Status: AC
  Administered 2017-06-17: 1 mg via ORAL
  Filled 2017-06-17: qty 1

## 2017-06-17 MED ORDER — CLONIDINE HCL 0.1 MG PO TABS: 0.1000 mg | ORAL_TABLET | Freq: Two times a day (BID) | ORAL | Status: DC

## 2017-06-17 MED ORDER — DICYCLOMINE HCL 10 MG PO CAPS: 20.0000 mg | ORAL_CAPSULE | Freq: Four times a day (QID) | ORAL | Status: DC | PRN

## 2017-06-17 MED ORDER — ONDANSETRON 4 MG PO TBDP: 4.0000 mg | ORAL_TABLET | Freq: Four times a day (QID) | ORAL | Status: DC | PRN

## 2017-06-17 MED ORDER — CLONIDINE HCL 0.1 MG PO TABS: 0.1000 mg | ORAL_TABLET | Freq: Every day | ORAL | Status: DC

## 2017-06-17 MED ORDER — METHOCARBAMOL 500 MG PO TABS: 500.0000 mg | ORAL_TABLET | Freq: Three times a day (TID) | ORAL | Status: DC | PRN

## 2017-06-17 MED ORDER — NAPROXEN 250 MG PO TABS: 500.0000 mg | ORAL_TABLET | Freq: Two times a day (BID) | ORAL | Status: DC | PRN

## 2017-06-17 NOTE — ED Notes (Signed)
Pt ambulatory to restroom at this time with sitter.
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
## 2017-06-17 NOTE — ED Triage Notes (Addendum)
Pt brought in by sheriff's department with IVC paperwork. Paperwork states, "The respondent is bipolar. The respondent told DSS worker, Sharlet Salina that if she did not kill his son that he would do it. The respondent is a danger to others and needs to examined." Pt states he was signing over custody paperwork for his mother to care for his son prior to coming to the hospital. Pt denies SI/HI. Pt denies SI. Pt states, "I want to hurt my son because of what he was doing". Pt denies saying he wanted to "kill his son".

## 2017-06-17 NOTE — ED Notes (Signed)
Pt resting with eyes closed, appears to be in no distress. Respirations are even and unlabored.  

## 2017-06-17 NOTE — ED Notes (Signed)
Pt given dinner tray.

## 2017-06-17 NOTE — ED Notes (Signed)
Pt in room becoming verbally loud when speaking about his son. States that he caught his son "masterbating" or "scratching himself" today while his daughter was using the bathroom. He states that he was going to sign over his parental rights today and wants his son out of his house and in an orphanage. Pt states "I cannot take care of two kids with a mental illness 6 days a week." Denies drug or ETOH use today but normally uses marijuana, "powder", and beer. Has taken his medications today and denies SI/HI/AVH at this time. Pt begins to talk about his son needing a girlfriend and that son goes to his friend's house and has "butt buddies".

## 2017-06-17 NOTE — Progress Notes (Signed)
CSW received a phone call from [facility] DSS caseworker Sharlet Salina [phone]), who stated that there is an open case involving pt currently. She reported of petitioning for an IVC for pt today after he came to the DSS office this morning and stated that he wanted to "sign over rights to his 35 y/o son because he's gay and a faggot". He reportedly also stated that he would kill his son if DSS didn't. CSW has shared the provided collateral information with appropriate TTS staff.   Wells Guiles, LCSW, LCAS Disposition CSW [facility]/TTS [phone]
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
## 2017-06-17 NOTE — ED Notes (Signed)
Pt ambulatory to restroom with no difficulty at this time.

## 2017-06-17 NOTE — ED Notes (Signed)
Pt wanded after changing into paper scrubs.

## 2017-06-17 NOTE — ED Notes (Signed)
TTS in progress 

## 2017-06-18 NOTE — ED Provider Notes (Signed)
Patient was accepted at [facility], Dr. Loyola Mast is the accepting physician.  At this time the patient is leaving the emergency department for his transfer, well-appearing, no decompensation in the emergency department.   Brad Hong, MD 06/18/17 [phone]

## 2017-06-18 NOTE — Progress Notes (Signed)
Pt has been accepted to [facility] and can arrive anytime. Admitting MD: Dr. Loyola Mast and number for report is [phone]. CSW notified Arther Dames of above.   Trula Slade, MSW, LCSW Clinical Social Worker 06/18/2017 9:17 AM
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
## 2017-06-18 NOTE — ED Notes (Signed)
TTS consult in process. 

## 2017-06-18 NOTE — ED Notes (Signed)
TTS completed. 

## 2017-06-18 NOTE — ED Notes (Signed)
Pt given breakfast meal tray. 

## 2017-06-18 NOTE — ED Notes (Signed)
[employer] arrived to transport patient to [facility]. Pt's belongings and paperwork given to deputy. Pt calm and cooperative.

## 2017-06-18 NOTE — Progress Notes (Signed)
Patient meets criteria for inpatient treatment. No appropriate beds available at [facility]. CSW faxed referrals to the following facilities for review:  [facility], [facility], [facility], [address], [place], [address], [place], [place], [place], [place].  TTS will continue to seek bed placement.   Trula Slade, MSW, LCSW Clinical Social Worker 06/18/2017 8:55 AM

## 2017-06-18 NOTE — ED Notes (Signed)
Called [employer] for Officer to transport  Pt to [facility].

## 2018-02-23 ENCOUNTER — Encounter: Payer: Medicaid Other | Admitting: Physician Assistant

## 2018-02-28 DIAGNOSIS — F315 Bipolar disorder, current episode depressed, severe, with psychotic features: Secondary | ICD-10-CM | POA: Diagnosis not present

## 2018-03-24 ENCOUNTER — Ambulatory Visit (INDEPENDENT_AMBULATORY_CARE_PROVIDER_SITE_OTHER): Payer: Medicaid Other | Admitting: Family Medicine

## 2018-03-24 ENCOUNTER — Encounter: Payer: Self-pay | Admitting: Family Medicine

## 2018-03-24 VITALS — BP 120/90 | HR 83 | Temp 98.4°F | Resp 15 | Ht 72.0 in | Wt 249.4 lb

## 2018-03-24 DIAGNOSIS — F17200 Nicotine dependence, unspecified, uncomplicated: Secondary | ICD-10-CM | POA: Diagnosis not present

## 2018-03-24 DIAGNOSIS — Z6833 Body mass index (BMI) 33.0-33.9, adult: Secondary | ICD-10-CM

## 2018-03-24 DIAGNOSIS — Z13220 Encounter for screening for lipoid disorders: Secondary | ICD-10-CM

## 2018-03-24 DIAGNOSIS — E669 Obesity, unspecified: Secondary | ICD-10-CM

## 2018-03-24 DIAGNOSIS — Z789 Other specified health status: Secondary | ICD-10-CM

## 2018-03-24 DIAGNOSIS — Z0000 Encounter for general adult medical examination without abnormal findings: Secondary | ICD-10-CM

## 2018-03-24 DIAGNOSIS — Z7289 Other problems related to lifestyle: Secondary | ICD-10-CM

## 2018-03-24 DIAGNOSIS — F319 Bipolar disorder, unspecified: Secondary | ICD-10-CM

## 2018-03-24 NOTE — Patient Instructions (Signed)
Goal for your blood pressure would be under 130/80 - see what it is when you go to your other appointments - and call me for an appointment if bottom number stays high like today.  Dieting and exercising can help this so you are not on another medication.  WE will call you with your lab results.   Health Maintenance, Male A healthy lifestyle and preventive care is important for your health and wellness. Ask your health care provider about what schedule of regular examinations is right for you. What should I know about weight and diet? Eat a Healthy Diet  Eat plenty of vegetables, fruits, whole grains, low-fat dairy products, and lean protein.  Do not eat a lot of foods high in solid fats, added sugars, or salt.  Maintain a Healthy Weight Regular exercise can help you achieve or maintain a healthy weight. You should:  Do at least 150 minutes of exercise each week. The exercise should increase your heart rate and make you sweat (moderate-intensity exercise).  Do strength-training exercises at least twice a week. Watch Your Levels of Cholesterol and Blood Lipids  Have your blood tested for lipids and cholesterol every 5 years starting at 36 years of age. If you are at high risk for heart disease, you should start having your blood tested when you are 36 years old. You may need to have your cholesterol levels checked more often if: ? Your lipid or cholesterol levels are high. ? You are older than 36 years of age. ? You are at high risk for heart disease. What should I know about cancer screening? Many types of cancers can be detected early and may often be prevented. Lung Cancer  You should be screened every year for lung cancer if: ? You are a current smoker who has smoked for at least 30 years. ? You are a former smoker who has quit within the past 15 years.  Talk to your health care provider about your screening options, when you should start screening, and how often you should be  screened. Colorectal Cancer  Routine colorectal cancer screening usually begins at 35 years of age and should be repeated every 5-10 years until you are 36 years old. You may need to be screened more often if early forms of precancerous polyps or small growths are found. Your health care provider may recommend screening at an earlier age if you have risk factors for colon cancer.  Your health care provider may recommend using home test kits to check for hidden blood in the stool.  A small camera at the end of a tube can be used to examine your colon (sigmoidoscopy or colonoscopy). This checks for the earliest forms of colorectal cancer. Prostate and Testicular Cancer  Depending on your age and overall health, your health care provider may do certain tests to screen for prostate and testicular cancer.  Talk to your health care provider about any symptoms or concerns you have about testicular or prostate cancer. Skin Cancer  Check your skin from head to toe regularly.  Tell your health care provider about any new moles or changes in moles, especially if: ? There is a change in a mole's size, shape, or color. ? You have a mole that is larger than a pencil eraser.  Always use sunscreen. Apply sunscreen liberally and repeat throughout the day.  Protect yourself by wearing long sleeves, pants, a wide-brimmed hat, and sunglasses when outside. What should I know about heart disease, diabetes, and  high blood pressure?  If you are 43-40 years of age, have your blood pressure checked every 3-5 years. If you are 90 years of age or older, have your blood pressure checked every year. You should have your blood pressure measured twice-once when you are at a hospital or clinic, and once when you are not at a hospital or clinic. Record the average of the two measurements. To check your blood pressure when you are not at a hospital or clinic, you can use: ? An automated blood pressure machine at a  pharmacy. ? A home blood pressure monitor.  Talk to your health care provider about your target blood pressure.  If you are between 2-13 years old, ask your health care provider if you should take aspirin to prevent heart disease.  Have regular diabetes screenings by checking your fasting blood sugar level. ? If you are at a normal weight and have a low risk for diabetes, have this test once every three years after the age of 68. ? If you are overweight and have a high risk for diabetes, consider being tested at a younger age or more often.  A one-time screening for abdominal aortic aneurysm (AAA) by ultrasound is recommended for men aged 65-75 years who are current or former smokers. What should I know about preventing infection? Hepatitis B If you have a higher risk for hepatitis B, you should be screened for this virus. Talk with your health care provider to find out if you are at risk for hepatitis B infection. Hepatitis C Blood testing is recommended for:  Everyone born from [birth year] through 1963.  Anyone with known risk factors for hepatitis C. Sexually Transmitted Diseases (STDs)  You should be screened each year for STDs including gonorrhea and chlamydia if: ? You are sexually active and are younger than 36 years of age. ? You are older than 36 years of age and your health care provider tells you that you are at risk for this type of infection. ? Your sexual activity has changed since you were last screened and you are at an increased risk for chlamydia or gonorrhea. Ask your health care provider if you are at risk.  Talk with your health care provider about whether you are at high risk of being infected with HIV. Your health care provider may recommend a prescription medicine to help prevent HIV infection. What else can I do?  Schedule regular health, dental, and eye exams.  Stay current with your vaccines (immunizations).  Do not use any tobacco products, such as cigarettes,  chewing tobacco, and e-cigarettes. If you need help quitting, ask your health care provider.  Limit alcohol intake to no more than 2 drinks per day. One drink equals 12 ounces of beer, 5 ounces of wine, or 1 ounces of hard liquor.  Do not use street drugs.  Do not share needles.  Ask your health care provider for help if you need support or information about quitting drugs.  Tell your health care provider if you often feel depressed.  Tell your health care provider if you have ever been abused or do not feel safe at home. This information is not intended to replace advice given to you by your health care provider. Make sure you discuss any questions you have with your health care provider. Document Released: 07/24/2007 Document Revised: 09/24/2015 Document Reviewed: 10/29/2014 Elsevier Interactive Patient Education  2019 [employer].

## 2018-03-24 NOTE — Progress Notes (Signed)
Patient: Brad Moreno, Male    DOB: [birth year], 38 y.o.   MRN: [id number] Visit Date: 03/24/2018  Today's Provider: Danelle Berry, PA-C   Chief Complaint  Patient presents with  . Annual Exam    Patient is fasting.    Subjective:    Annual physical exam Brad Moreno is a 38 y.o. male who presents today for health maintenance and complete physical. He feels well. He reports exercising not very much. He reports he is sleeping well.  ----------------------------------------------------------------- He is new pt for me, previously seen by [facility].    Psychiatric Dx - pt endorses  Bipolar 1 disorder, currently well controlled with current meds, he is seeing psychiatry outpt, he's had multiple ER visits and admissions over the past year.  He denies any current auditory or visual hallucinations he denies any homicidal or suicidal ideations denies any plan of HI or SI.  HE is on abilify, zyprexa and hydroxyzine - but last prescriber I can see in an inpatient NP at [facility].    He is going to [facility] outpatient services.  He does not know if someone has checked labs on him since meds changed earlier this year.  There is some outside info available which shows different med list: Zyprexa 10 mg tablet take 1 tablet by oral route 3 times every day 10 MG Jan-21-2020 -  Active   fluphenazine 10 mg tablet take 1 tablet by oral route 3 times every day 10 MG Jan-21-2020 -  Active   Depakote ER 250 mg tablet,extended release take 3 tablet by oral route 2 times every day 750 MG Jan-21-2020 -  Active   benztropine 1 mg tablet take 1 tablet by oral route 3 times every day 1 MG Jan-21-2020 -  Active    Pt lives by himself currently, he is working full time.  He has no other concerns.    Review of Systems  Constitutional: Negative.  Negative for activity change, appetite change, fatigue and unexpected weight change.  HENT: Negative.   Eyes: Negative.     Respiratory: Negative.  Negative for shortness of breath.   Cardiovascular: Negative.  Negative for chest pain, palpitations and leg swelling.  Gastrointestinal: Negative.  Negative for abdominal pain and blood in stool.  Endocrine: Negative.   Genitourinary: Negative.  Negative for decreased urine volume, difficulty urinating, testicular pain and urgency.  Skin: Negative.  Negative for color change and pallor.  Allergic/Immunologic: Negative.   Neurological: Negative.  Negative for syncope, weakness, light-headedness and numbness.  Psychiatric/Behavioral: Negative.  Negative for confusion, dysphoric mood, self-injury and suicidal ideas. The patient is not nervous/anxious.   All other systems reviewed and are negative.   Social History      He  reports that he has been smoking. He has a 0.25 pack-year smoking history. He has never used smokeless tobacco. He reports current alcohol use of about 3.0 standard drinks of alcohol per week. He reports previous drug use. Drug: Marijuana.       Social History   Socioeconomic History  . Marital status: Legally Separated    Spouse name: Not on file  . Number of children: Not on file  . Years of education: Not on file  . Highest education level: Not on file  Occupational History    Employer: [employer]  Social Needs  . Financial resource strain: Not on file  . Food insecurity:    Worry: Not on file    Inability: Not  on file  . Transportation needs:    Medical: Not on file    Non-medical: Not on file  Tobacco Use  . Smoking status: Current Every Day Smoker    Packs/day: 0.25    Years: 1.00    Pack years: 0.25  . Smokeless tobacco: Never Used  Substance and Sexual Activity  . Alcohol use: Yes    Alcohol/week: 3.0 standard drinks    Types: 3 Cans of beer per week    Comment: maybe 6 on weekends only  . Drug use: Not Currently    Types: Marijuana    Comment: marijuana last used 06/16/17  . Sexual activity: Not Currently  Lifestyle  .  Physical activity:    Days per week: 0 days    Minutes per session: Not on file  . Stress: Not on file  Relationships  . Social connections:    Talks on phone: Not on file    Gets together: Not on file    Attends religious service: Not on file    Active member of club or organization: Not on file    Attends meetings of clubs or organizations: Not on file    Relationship status: Not on file  Other Topics Concern  . Not on file  Social History Narrative   10m, 66f, 32f - has daughters weekends   Works weekdays has kids on weekends   Lives alone    Past Medical History:  Diagnosis Date  . Bipolar 1 disorder (HCC)   . Depression 06/2010  . Schizophrenia [facility])      Patient Active Problem List   Diagnosis Date Noted  . Body mass index (BMI) 35.0-35.9, adult 07/06/2017  . Psychosis (HCC) 03/23/2017  . Alcohol use 02/17/2017  . Bipolar I disorder, current or most recent episode depressed, with psychotic features (HCC) 11/13/2012  . Bipolar disorder, unspecified (HCC) 06/09/2010    Past Surgical History:  Procedure Laterality Date  . WISDOM TOOTH EXTRACTION      Family History        Family Status  Relation Name Status  . Mother  Alive  . PGF  Deceased  . Father  Alive  . Brother  Alive  . MGM  Deceased  . MGF  Deceased  . PGM  Alive  . Mat Uncle  (Not Specified)        His family history includes Alcohol abuse in his brother; Bipolar disorder in his maternal uncle; Cancer (age of onset: 15) in his paternal grandfather; Depression in his mother; Diabetes in his mother.      Allergies  Allergen Reactions  . Amoxicillin Other (See Comments)    Unknown reaction when he was young  . Pork-Derived Products Other (See Comments)    UNKNOWN     Current Outpatient Medications:  .  ARIPiprazole (ABILIFY) 20 MG tablet, Take 1 tablet (20 mg total) by mouth at bedtime. For mood control, Disp: 30 tablet, Rfl: 0 .  hydrOXYzine (ATARAX/VISTARIL) 50 MG tablet, Take 1 tablet  (50 mg total) by mouth every 6 (six) hours as needed for anxiety., Disp: 60 tablet, Rfl: 0 .  OLANZapine zydis (ZYPREXA) 5 MG disintegrating tablet, Take 1 tablet (5 mg total) by mouth 2 (two) times daily. For mood control, Disp: 60 tablet, Rfl: 0 .  nicotine polacrilex (NICORETTE) 2 MG gum, Take 1 each (2 mg total) by mouth as needed for smoking cessation. (Patient not taking: Reported on 06/17/2017), Disp: 100 tablet, Rfl: 0   Patient Care  Team: Danelle Berryapia, Janal Haak, PA-C as PCP - General (Family Medicine)      Objective:   Vitals: BP 120/90   Pulse 83   Temp 98.4 F (36.9 C) (Oral)   Resp 15   Ht 6' (1.829 m)   Wt 249 lb 6 oz (113.1 kg)   SpO2 98%   BMI 33.82 kg/m    Vitals:   03/24/18 0944  BP: 120/90  Pulse: 83  Resp: 15  Temp: 98.4 F (36.9 C)  TempSrc: Oral  SpO2: 98%  Weight: 249 lb 6 oz (113.1 kg)  Height: 6' (1.829 m)     Physical Exam Vitals signs and nursing note reviewed.  Constitutional:      General: He is not in acute distress.    Appearance: Normal appearance. He is well-developed. He is obese. He is not ill-appearing, toxic-appearing or diaphoretic.  HENT:     Head: Normocephalic and atraumatic.     Jaw: No trismus.     Right Ear: Tympanic membrane, ear canal and external ear normal.     Left Ear: Tympanic membrane, ear canal and external ear normal.     Nose: Nose normal. No mucosal edema, congestion or rhinorrhea.     Right Sinus: No maxillary sinus tenderness or frontal sinus tenderness.     Left Sinus: No maxillary sinus tenderness or frontal sinus tenderness.     Mouth/Throat:     Mouth: Mucous membranes are moist.     Pharynx: Oropharynx is clear. Uvula midline. No oropharyngeal exudate, posterior oropharyngeal erythema or uvula swelling.  Eyes:     General: Lids are normal.     Conjunctiva/sclera: Conjunctivae normal.     Pupils: Pupils are equal, round, and reactive to light.  Neck:     Musculoskeletal: Normal range of motion and neck supple.      Trachea: Trachea and phonation normal. No tracheal deviation.  Cardiovascular:     Rate and Rhythm: Normal rate and regular rhythm.     Pulses: Normal pulses.          Radial pulses are 2+ on the right side and 2+ on the left side.       Posterior tibial pulses are 2+ on the right side and 2+ on the left side.     Heart sounds: Normal heart sounds. No murmur. No friction rub. No gallop.   Pulmonary:     Effort: Pulmonary effort is normal.     Breath sounds: Normal breath sounds. No wheezing, rhonchi or rales.  Abdominal:     General: Bowel sounds are normal. There is no distension.     Palpations: Abdomen is soft.     Tenderness: There is no abdominal tenderness. There is no guarding or rebound.  Musculoskeletal: Normal range of motion.  Skin:    General: Skin is warm and dry.     Capillary Refill: Capillary refill takes less than 2 seconds.     Findings: No rash.  Neurological:     Mental Status: He is alert and oriented to person, place, and time.     Gait: Gait normal.  Psychiatric:        Attention and Perception: Attention normal.        Mood and Affect: Mood normal. Affect is flat.        Speech: Speech is delayed.        Behavior: Behavior is slowed. Behavior is not agitated or withdrawn. Behavior is cooperative.        Thought  Content: Thought content is not paranoid or delusional. Thought content does not include homicidal or suicidal ideation. Thought content does not include homicidal or suicidal plan.      Depression Screen PHQ 2/9 Scores 03/24/2018 02/17/2017 01/13/2017  PHQ - 2 Score 0 0 0  PHQ- 9 Score 0 - -      Office Visit from 03/24/2018 in [facility]  Alcohol Use Disorder Identification Test Final Score (AUDIT)  6       Assessment & Plan:     Routine Health Maintenance and Physical Exam  Exercise Activities and Dietary recommendations Goals - decrease calories, increase aerobic exercise  None    Discussed health benefits of  physical activity, and encouraged him to engage in regular exercise appropriate for his age and condition.   Immunization History  Administered Date(s) Administered  . Hepatitis B 12/16/1993, 01/13/1994, 07/07/1994  . Influenza,inj,Quad PF,6+ Mos 02/27/2014, 12/05/2014  . Influenza-Unspecified 12/24/2017  . Tdap 08/01/2013    Health Maintenance  Topic Date Due  . Janet Berlin  08/02/2023  . INFLUENZA VACCINE  Completed  . HIV Screening  Completed       Screening labs to check fasting sugar, cholesterol and screen for anemia, check liver function and kidney function. HE has a lot of psychiatric meds, including zyprexa - need to request records from [facility] of any monitoring that they have done while managing him since possibly May 2019?   Pt is very appropriate in the room, smiling, slightly flat affect, but he does engage and answers questions appropriately.   Problem List Items Addressed This Visit      Other   Bipolar disorder, unspecified (HCC)    Managed by psychiatry at [facility]      Alcohol use    Currently endorses few beers on weekdays, and more on weekends, Audit completed      Current smoker    Some motivation to stop smoking, smoking cessation discussed for 4-5 min, using Nicorette, handout given      Class 1 obesity with body mass index (BMI) of 33.0 to 33.9 in adult    With his current medications I am unsure of who is monitoring his blood work, screen for diabetes with fasting blood sugar today, hyperlipidemia with lipid panel, BMI is elevated advised diet and exercise, need to continue to monitor because of his increased risks for metabolic syndrome and subsequent development of comorbid conditions, check liver and renal function today      Relevant Orders   CBC with Differential/Platelet (Completed)   COMPLETE METABOLIC PANEL WITH GFR (Completed)   Lipid panel (Completed)    Other Visit Diagnoses    Encounter for preventive health examination    -   Primary   Screening for hyperlipidemia       Relevant Orders   COMPLETE METABOLIC PANEL WITH GFR (Completed)   Lipid panel (Completed)       Danelle Berry, PA-C 03/24/18 10:22 AM  [facility]

## 2018-03-25 LAB — CBC WITH DIFFERENTIAL/PLATELET
Absolute Monocytes: 400 cells/uL (ref 200–950)
BASOS ABS: 18 {cells}/uL (ref 0–200)
Basophils Relative: 0.4 %
EOS ABS: 198 {cells}/uL (ref 15–500)
EOS PCT: 4.3 %
HCT: 41.7 % (ref 38.5–50.0)
HEMOGLOBIN: 14.1 g/dL (ref 13.2–17.1)
Lymphs Abs: 2318 cells/uL (ref 850–3900)
MCH: 27.1 pg (ref 27.0–33.0)
MCHC: 33.8 g/dL (ref 32.0–36.0)
MCV: 80 fL (ref 80.0–100.0)
MPV: 11.3 fL (ref 7.5–12.5)
Monocytes Relative: 8.7 %
NEUTROS PCT: 36.2 %
Neutro Abs: 1665 cells/uL (ref 1500–7800)
PLATELETS: 212 10*3/uL (ref 140–400)
RBC: 5.21 10*6/uL (ref 4.20–5.80)
RDW: 14.1 % (ref 11.0–15.0)
TOTAL LYMPHOCYTE: 50.4 %
WBC: 4.6 10*3/uL (ref 3.8–10.8)

## 2018-03-25 LAB — COMPLETE METABOLIC PANEL WITH GFR
AG Ratio: 1.4 (calc) (ref 1.0–2.5)
ALBUMIN MSPROF: 4.2 g/dL (ref 3.6–5.1)
ALT: 35 U/L (ref 9–46)
AST: 23 U/L (ref 10–40)
Alkaline phosphatase (APISO): 65 U/L (ref 36–130)
BUN: 10 mg/dL (ref 7–25)
CO2: 27 mmol/L (ref 20–32)
CREATININE: 0.99 mg/dL (ref 0.60–1.35)
Calcium: 9.4 mg/dL (ref 8.6–10.3)
Chloride: 102 mmol/L (ref 98–110)
GFR, Est African American: 114 mL/min/{1.73_m2} (ref >=60)
GFR, Est Non African American: 98 mL/min/{1.73_m2} (ref >=60)
GLUCOSE: 96 mg/dL (ref 65–99)
Globulin: 3 g/dL (calc) (ref 1.9–3.7)
Potassium: 4.4 mmol/L (ref 3.5–5.3)
Sodium: 139 mmol/L (ref 135–146)
Total Bilirubin: 0.4 mg/dL (ref 0.2–1.2)
Total Protein: 7.2 g/dL (ref 6.1–8.1)

## 2018-03-25 LAB — LIPID PANEL
CHOL/HDL RATIO: 4 (calc) (ref <5.0)
Cholesterol: 180 mg/dL (ref <200)
HDL: 45 mg/dL (ref >=40)
LDL CHOLESTEROL (CALC): 109 mg/dL — AB
Non-HDL Cholesterol (Calc): 135 mg/dL (calc) — ABNORMAL HIGH (ref <130)
TRIGLYCERIDES: 151 mg/dL — AB (ref <150)

## 2018-03-28 ENCOUNTER — Encounter: Payer: Self-pay | Admitting: Family Medicine

## 2018-03-29 ENCOUNTER — Encounter: Payer: Self-pay | Admitting: Family Medicine

## 2018-03-29 DIAGNOSIS — E669 Obesity, unspecified: Secondary | ICD-10-CM | POA: Insufficient documentation

## 2018-03-29 DIAGNOSIS — F17200 Nicotine dependence, unspecified, uncomplicated: Secondary | ICD-10-CM | POA: Insufficient documentation

## 2018-03-29 DIAGNOSIS — Z6833 Body mass index (BMI) 33.0-33.9, adult: Secondary | ICD-10-CM

## 2018-03-29 NOTE — Assessment & Plan Note (Signed)
Managed by psychiatry at [facility]

## 2018-03-29 NOTE — Assessment & Plan Note (Signed)
Currently endorses few beers on weekdays, and more on weekends, Audit completed

## 2018-03-29 NOTE — Assessment & Plan Note (Signed)
Some motivation to stop smoking, smoking cessation discussed for 4-5 min, using Nicorette, handout given

## 2018-03-29 NOTE — Assessment & Plan Note (Signed)
With his current medications I am unsure of who is monitoring his blood work, screen for diabetes with fasting blood sugar today, hyperlipidemia with lipid panel, BMI is elevated advised diet and exercise, need to continue to monitor because of his increased risks for metabolic syndrome and subsequent development of comorbid conditions, check liver and renal function today

## 2018-03-30 ENCOUNTER — Other Ambulatory Visit: Payer: Self-pay

## 2018-03-30 NOTE — Addendum Note (Signed)
Addended by: Jeralene Peters on: 03/30/2018 03:30 PM   Modules accepted: Orders

## 2018-04-10 ENCOUNTER — Encounter: Payer: Self-pay | Admitting: Family Medicine

## 2018-07-04 DIAGNOSIS — F315 Bipolar disorder, current episode depressed, severe, with psychotic features: Secondary | ICD-10-CM | POA: Diagnosis not present

## 2018-12-28 ENCOUNTER — Ambulatory Visit (INDEPENDENT_AMBULATORY_CARE_PROVIDER_SITE_OTHER): Payer: BC Managed Care – PPO | Admitting: Family Medicine

## 2018-12-28 ENCOUNTER — Ambulatory Visit (HOSPITAL_COMMUNITY)
Admission: RE | Admit: 2018-12-28 | Discharge: 2018-12-28 | Disposition: A | Discharge status: Home / Self Care | Payer: BC Managed Care – PPO | Source: Ambulatory Visit | Attending: Family Medicine | Admitting: Family Medicine

## 2018-12-28 ENCOUNTER — Encounter: Payer: Self-pay | Admitting: Family Medicine

## 2018-12-28 ENCOUNTER — Other Ambulatory Visit: Payer: Self-pay

## 2018-12-28 VITALS — BP 132/72 | HR 70 | Temp 97.2°F | Resp 16 | Ht 69.0 in | Wt 206.0 lb

## 2018-12-28 DIAGNOSIS — M25561 Pain in right knee: Secondary | ICD-10-CM | POA: Diagnosis not present

## 2018-12-28 DIAGNOSIS — R600 Localized edema: Secondary | ICD-10-CM | POA: Diagnosis not present

## 2018-12-28 NOTE — Progress Notes (Signed)
Subjective:    Patient ID: Brad Moreno, male    DOB: [birth year], 38 y.o.   MRN: [id number]  HPI  Patient is a 38 year old African-American gentleman who presents today with 3 months of right knee pain.  He states that there was no specific injury.  He does not remember feeling a pop or tearing sensation in his knee.  Instead the pain gradually started occurring.  It has steadily worsened over the last 3 months.  He has pain in the posterior aspect of his right knee over the medial joint line.  It hurts going up and down steps.  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  After prolonged standing and walking at work he starts to feel an aching pain in the back of his knee that steadily worsens.  He denies any feeling that the knee may give way on him.  He denies feeling any instability in the knee.  There is no crepitus in the knee.  He has no tenderness to palpation over the joint lines bilaterally.  He denies any pain underneath the patella.  There is no erythema or effusion.  There is no laxity to varus or valgus stress.  Anterior and posterior drawer sign are normal.  He does have some mild pain with Apley grind but there is no palpable click.  He has tried regularly taking ibuprofen 800 mg and although this helps the pain it always returns with prolonged standing and walking  Past Medical History:  Diagnosis Date  . Bipolar 1 disorder (HCC)   . Depression 06/2010  . Psychosis (HCC) 03/23/2017  . Schizophrenia [facility])    Past Surgical History:  Procedure Laterality Date  . WISDOM TOOTH EXTRACTION     Current Outpatient Medications on File Prior to Visit  Medication Sig Dispense Refill  . benztropine (COGENTIN) 1 MG tablet Take 1 mg by mouth 3 (three) times daily.     . divalproex (DEPAKOTE ER) 250 MG 24 hr tablet Take 750 mg by mouth 2 (two) times daily.     . fluPHENAZine (PROLIXIN) 10 MG tablet Take 10 mg by mouth 3 (three) times daily.     Marland Kitchen OLANZapine (ZYPREXA) 10 MG tablet Take  10 mg by mouth 3 (three) times daily.      No current facility-administered medications on file prior to visit.    Allergies  Allergen Reactions  . Amoxicillin Other (See Comments)    Unknown reaction when he was young  . Pork-Derived Products Other (See Comments)    UNKNOWN   Social History   Socioeconomic History  . Marital status: Legally Separated    Spouse name: Not on file  . Number of children: Not on file  . Years of education: Not on file  . Highest education level: Not on file  Occupational History    Employer: [employer]  Social Needs  . Financial resource strain: Not on file  . Food insecurity    Worry: Not on file    Inability: Not on file  . Transportation needs    Medical: Not on file    Non-medical: Not on file  Tobacco Use  . Smoking status: Current Every Day Smoker    Packs/day: 0.25    Years: 1.00    Pack years: 0.25  . Smokeless tobacco: Never Used  Substance and Sexual Activity  . Alcohol use: Yes    Alcohol/week: 3.0 standard drinks    Types: 3 Cans of beer per week  Comment: maybe 6 on weekends only  . Drug use: Not Currently    Types: Marijuana    Comment: marijuana last used 06/16/17  . Sexual activity: Not Currently  Lifestyle  . Physical activity    Days per week: 0 days    Minutes per session: Not on file  . Stress: Not on file  Relationships  . Social Herbalist on phone: Not on file    Gets together: Not on file    Attends religious service: Not on file    Active member of club or organization: Not on file    Attends meetings of clubs or organizations: Not on file    Relationship status: Not on file  . Intimate partner violence    Fear of current or ex partner: Not on file    Emotionally abused: Not on file    Physically abused: Not on file    Forced sexual activity: Not on file  Other Topics Concern  . Not on file  Social History Narrative   36m, 48f, 34f - has daughters weekends   Works weekdays has kids on  weekends   Lives alone     Review of Systems  All other systems reviewed and are negative.      Objective:   Physical Exam Vitals signs reviewed.  Constitutional:      Appearance: Normal appearance.  Cardiovascular:     Rate and Rhythm: Normal rate and regular rhythm.     Heart sounds: Normal heart sounds.  Pulmonary:     Effort: Pulmonary effort is normal. No respiratory distress.     Breath sounds: Normal breath sounds. No stridor. No wheezing or rales.  Musculoskeletal:     Right knee: He exhibits normal range of motion, no swelling, no effusion, no LCL laxity, normal patellar mobility, normal meniscus and no MCL laxity. No tenderness found. No medial joint line, no lateral joint line, no MCL and no LCL tenderness noted.  Neurological:     Mental Status: He is alert.           Assessment & Plan:  Acute pain of right knee - Plan: DG Knee Complete 4 Views Right  Given his age and description, I suspect a meniscal tear.  There are no abnormalities seen on exam other than pain in the posterior medial aspect of the knee with Apley grind.  Begin by obtaining an x-ray.  If the x-ray is normal, I would recommend trying a cortisone injection in the knee to see if this will alleviate inflammation.  If this does not help I would recommend either an MRI of the knee or an orthopedics consultation.

## 2019-01-10 ENCOUNTER — Encounter: Payer: Self-pay | Admitting: Family Medicine

## 2019-02-15 ENCOUNTER — Encounter: Payer: Self-pay | Admitting: Family Medicine

## 2019-02-15 ENCOUNTER — Ambulatory Visit (INDEPENDENT_AMBULATORY_CARE_PROVIDER_SITE_OTHER): Payer: BC Managed Care – PPO | Admitting: Family Medicine

## 2019-02-15 ENCOUNTER — Other Ambulatory Visit: Payer: Self-pay

## 2019-02-15 VITALS — BP 140/74 | HR 88 | Temp 97.4°F | Resp 14 | Ht 69.0 in | Wt 200.0 lb

## 2019-02-15 DIAGNOSIS — Z7251 High risk heterosexual behavior: Secondary | ICD-10-CM | POA: Diagnosis not present

## 2019-02-15 NOTE — Progress Notes (Signed)
Subjective:     Patient ID: Brad Moreno, male   DOB: March 31, 1980, 39 y.o.   MRN: [id number]  HPI Patient is a 39 year old African-American gentleman here today requesting STD testing.  His last potential exposure was 2 weeks ago with a new partner.  He wore a condom however it was a Lamb skin condom and he is concerned about potential exposure.  He denies any penile discharge.  He denies any rash.  He denies any inguinal lymphadenopathy.  He denies any fevers or chills or flulike symptoms. Past Medical History:  Diagnosis Date  . Bipolar 1 disorder ([place])   . Depression 06/2010  . Psychosis ([place]) 03/23/2017  . Schizophrenia [facility])    Past Surgical History:  Procedure Laterality Date  . WISDOM TOOTH EXTRACTION     Current Outpatient Medications on File Prior to Visit  Medication Sig Dispense Refill  . benztropine (COGENTIN) 1 MG tablet Take 1 mg by mouth 3 (three) times daily.     . divalproex (DEPAKOTE ER) 250 MG 24 hr tablet Take 750 mg by mouth 2 (two) times daily.     . fluPHENAZine (PROLIXIN) 10 MG tablet Take 10 mg by mouth 3 (three) times daily.     Marland Kitchen OLANZapine (ZYPREXA) 10 MG tablet Take 10 mg by mouth 3 (three) times daily.      No current facility-administered medications on file prior to visit.   Allergies  Allergen Reactions  . Amoxicillin Other (See Comments)    Unknown reaction when he was young  . Pork-Derived Products Other (See Comments)    UNKNOWN   Social History   Socioeconomic History  . Marital status: Legally Separated    Spouse name: Not on file  . Number of children: Not on file  . Years of education: Not on file  . Highest education level: Not on file  Occupational History    Employer: [employer]  Tobacco Use  . Smoking status: Former Smoker    Packs/day: 0.25    Years: 1.00    Pack years: 0.25    Quit date: 02/01/2019    Years since quitting: 0.0  . Smokeless tobacco: Never Used  Substance and Sexual Activity  . Alcohol use: Yes     Alcohol/week: 3.0 standard drinks    Types: 3 Cans of beer per week    Comment: maybe 6 on weekends only  . Drug use: Not Currently    Types: Marijuana    Comment: marijuana last used 06/16/17  . Sexual activity: Not Currently  Other Topics Concern  . Not on file  Social History Narrative   40m, 61f, 60f - has daughters weekends   Works weekdays has kids on weekends   Lives alone   Social Determinants of Health   Financial Resource Strain:   . Difficulty of Paying Living Expenses: Not on file  Food Insecurity:   . Worried About Charity fundraiser in the Last Year: Not on file  . Ran Out of Food in the Last Year: Not on file  Transportation Needs:   . Lack of Transportation (Medical): Not on file  . Lack of Transportation (Non-Medical): Not on file  Physical Activity: Unknown  . Days of Exercise per Week: 0 days  . Minutes of Exercise per Session: Not on file  Stress:   . Feeling of Stress : Not on file  Social Connections:   . Frequency of Communication with Friends and Family: Not on file  . Frequency of Social Gatherings  with Friends and Family: Not on file  . Attends Religious Services: Not on file  . Active Member of Clubs or Organizations: Not on file  . Attends Banker Meetings: Not on file  . Marital Status: Not on file  Intimate Partner Violence:   . Fear of Current or Ex-Partner: Not on file  . Emotionally Abused: Not on file  . Physically Abused: Not on file  . Sexually Abused: Not on file     Review of Systems  All other systems reviewed and are negative.      Objective:   Physical Exam Vitals reviewed.  Constitutional:      Appearance: Normal appearance.  Cardiovascular:     Rate and Rhythm: Normal rate and regular rhythm.     Pulses: Normal pulses.     Heart sounds: Normal heart sounds. No murmur. No friction rub. No gallop.   Pulmonary:     Effort: Pulmonary effort is normal. No respiratory distress.     Breath sounds: Normal breath  sounds. No wheezing, rhonchi or rales.  Abdominal:     General: Abdomen is flat. Bowel sounds are normal.     Palpations: Abdomen is soft.  Neurological:     Mental Status: He is alert.        Assessment:    High risk heterosexual behavior - Plan: RPR, HIV, HSV 1/2 Ab (IgM), IFA w/rflx Titer, HSV(herpes simplex vrs) 1+2 ab-IgG, C. TRACHOMATIS/N. GONORRHOEAE RNA ([facility]), CANCELED: HSV(herpes smplx)abs-1+2(IgG+IgM)-bld, CANCELED: C. trachomatis/N. gonorrhoeae RNA      Plan:     We discussed potential test to be screened for.  Patient request all of the above test including an RPR, HIV, HSV antibody testing, gonorrhea, chlamydia testing.  I have recommended the patient return again in 6 months to repeat HIV given the close proximity to his recent exposure to truly rule out infection.

## 2019-02-16 LAB — HIV ANTIBODY (ROUTINE TESTING W REFLEX): HIV 1&2 Ab, 4th Generation: NONREACTIVE

## 2019-02-16 LAB — RPR: RPR Ser Ql: NONREACTIVE

## 2019-02-16 LAB — C. TRACHOMATIS/N. GONORRHOEAE RNA
C. trachomatis RNA, TMA: NOT DETECTED
N. gonorrhoeae RNA, TMA: NOT DETECTED

## 2019-02-21 LAB — HSV 1/2 AB (IGM), IFA W/RFLX TITER
HSV 1 IgM Screen: NEGATIVE
HSV 2 IgM Screen: NEGATIVE

## 2019-02-21 LAB — HSV(HERPES SIMPLEX VRS) I + II AB-IGG
HAV 1 IGG,TYPE SPECIFIC AB: <0.9 index
HSV 2 IGG,TYPE SPECIFIC AB: 7.55 index — ABNORMAL HIGH

## 2019-03-15 ENCOUNTER — Ambulatory Visit (INDEPENDENT_AMBULATORY_CARE_PROVIDER_SITE_OTHER): Payer: BC Managed Care – PPO | Admitting: Family Medicine

## 2019-03-15 ENCOUNTER — Encounter: Payer: Self-pay | Admitting: Family Medicine

## 2019-03-15 ENCOUNTER — Other Ambulatory Visit: Payer: Self-pay

## 2019-03-15 VITALS — BP 122/64 | HR 96 | Temp 97.1°F | Resp 14 | Ht 69.0 in | Wt 189.0 lb

## 2019-03-15 DIAGNOSIS — R768 Other specified abnormal immunological findings in serum: Secondary | ICD-10-CM | POA: Diagnosis not present

## 2019-03-15 NOTE — Progress Notes (Signed)
Subjective:     Patient ID: Brad Moreno, male   DOB: 1980/09/03, 39 y.o.   MRN: [id number]  HPI  02/15/19 Patient is a 39 year old African-American gentleman here today requesting STD testing.  His last potential exposure was 2 weeks ago with a new partner.  He wore a condom however it was a Lamb skin condom and he is concerned about potential exposure.  He denies any penile discharge.  He denies any rash.  He denies any inguinal lymphadenopathy.  He denies any fevers or chills or flulike symptoms.  At that time, my plan was: We discussed potential test to be screened for.  Patient request all of the above test including an RPR, HIV, HSV antibody testing, gonorrhea, chlamydia testing.  I have recommended the patient return again in 6 months to repeat HIV given the close proximity to his recent exposure to truly rule out infection.   03/15/19 Office Visit on 02/15/2019  Component Date Value Ref Range Status  . RPR Ser Ql 02/15/2019 NON-REACTIVE  NON-REACTI Final  . HIV 1&2 Ab, 4th Generation 02/15/2019 NON-REACTIVE  NON-REACTI Final   Comment: HIV-1 antigen and HIV-1/HIV-2 antibodies were not detected. There is no laboratory evidence of HIV infection. Marland Kitchen PLEASE NOTE: This information has been disclosed to you from records whose confidentiality may be protected by state law.  If your state requires such protection, then the state law prohibits you from making any further disclosure of the information without the specific written consent of the person to whom it pertains, or as otherwise permitted by law. A general authorization for the release of medical or other information is NOT sufficient for this purpose. . For additional information please refer to http://education.[facility].com/faq/FAQ106 (This link is being provided for informational/ educational purposes only.) . Marland Kitchen The performance of this assay has not been clinically validated in patients less than 2 years  old. .   . HSV 1 IgM Screen 02/15/2019 Negative  Negative Final  . HSV 2 IgM Screen 02/15/2019 Negative  Negative Final   Comment: . The IFA procedure for measuring IgM antibodies to HSV 1 and HSV 2 detects both type-common and type-specific HSV antibodies. Thus, IgM reactivity to both HSV 1 and HSV 2 may represent crossreactive HSV antibodies rather than exposure to both HSV 1 and HSV 2. . This test was developed and its analytical performance characteristics have been determined by [employer], [place], [place]. It has not been cleared or approved by the FDA. This assay has been validated pursuant to the CLIA regulations and is used for clinical purposes. .   . HAV 1 IGG,TYPE SPECIFIC AB 02/15/2019 <0.90  index Final  . HSV 2 IGG,TYPE SPECIFIC AB 02/15/2019 7.55* index Final   Comment:                           Index          Interpretation                           -----          --------------                           <0.90          Negative  0.90-1.09      Equivocal                           >1.09          Positive . This assay utilizes recombinant type-specific antigens to differentiate HSV-1 from HSV-2 infections. A positive result cannot distinguish between recent and past infection. If recent HSV infection is suspected but the results are negative or equivocal, the assay should be repeated in 4-6 weeks. The performance characteristics of the assay have not been established for pediatric populations, immunocompromised patients, or neonatal screening.   . C. trachomatis RNA, TMA 02/15/2019 NOT DETECTED  NOT DETECT Final  . N. gonorrhoeae RNA, TMA 02/15/2019 NOT DETECTED  NOT DETECT Final   Comment: The analytical performance characteristics of this assay, when used to test SurePath(TM) specimens have been determined by [employer] [facility]. The modifications have not been cleared or approved by the FDA. This assay has been  validated pursuant to the CLIA regulations and is used for clinical purposes. . For additional information, please refer to https://education.[facility].com/faq/FAQ154 (This link is being provided for information/ educational purposes only.) .    Patient is here today discuss his lab work.  He had questions about the positive IgG for HSV 2.  He does not recall having an outbreak of genital herpes or cold sores in the past.  He questions what this means and if he is contagious.  Past Medical History:  Diagnosis Date  . Bipolar 1 disorder (HCC)   . Depression 06/2010  . Psychosis (HCC) 03/23/2017  . Schizophrenia [facility])    Past Surgical History:  Procedure Laterality Date  . WISDOM TOOTH EXTRACTION     Current Outpatient Medications on File Prior to Visit  Medication Sig Dispense Refill  . benztropine (COGENTIN) 1 MG tablet Take 1 mg by mouth 3 (three) times daily.     . divalproex (DEPAKOTE ER) 250 MG 24 hr tablet Take 750 mg by mouth 2 (two) times daily.     . fluPHENAZine (PROLIXIN) 10 MG tablet Take 10 mg by mouth 3 (three) times daily.     Marland Kitchen OLANZapine (ZYPREXA) 10 MG tablet Take 10 mg by mouth 3 (three) times daily.      No current facility-administered medications on file prior to visit.   Allergies  Allergen Reactions  . Amoxicillin Other (See Comments)    Unknown reaction when he was young  . Pork-Derived Products Other (See Comments)    UNKNOWN   Social History   Socioeconomic History  . Marital status: Legally Separated    Spouse name: Not on file  . Number of children: Not on file  . Years of education: Not on file  . Highest education level: Not on file  Occupational History    Employer: [employer]  Tobacco Use  . Smoking status: Former Smoker    Packs/day: 0.25    Years: 1.00    Pack years: 0.25    Quit date: 02/01/2019    Years since quitting: 0.1  . Smokeless tobacco: Never Used  Substance and Sexual Activity  . Alcohol use: Yes    Alcohol/week:  3.0 standard drinks    Types: 3 Cans of beer per week    Comment: maybe 6 on weekends only  . Drug use: Not Currently    Types: Marijuana    Comment: marijuana last used 06/16/17  . Sexual activity: Not Currently  Other Topics Concern  . Not  on file  Social History Narrative   44m, 1f, 63f - has daughters weekends   Works weekdays has kids on weekends   Lives alone   Social Determinants of Health   Financial Resource Strain:   . Difficulty of Paying Living Expenses: Not on file  Food Insecurity:   . Worried About Programme researcher, broadcasting/film/video in the Last Year: Not on file  . Ran Out of Food in the Last Year: Not on file  Transportation Needs:   . Lack of Transportation (Medical): Not on file  . Lack of Transportation (Non-Medical): Not on file  Physical Activity: Unknown  . Days of Exercise per Week: 0 days  . Minutes of Exercise per Session: Not on file  Stress:   . Feeling of Stress : Not on file  Social Connections:   . Frequency of Communication with Friends and Family: Not on file  . Frequency of Social Gatherings with Friends and Family: Not on file  . Attends Religious Services: Not on file  . Active Member of Clubs or Organizations: Not on file  . Attends Banker Meetings: Not on file  . Marital Status: Not on file  Intimate Partner Violence:   . Fear of Current or Ex-Partner: Not on file  . Emotionally Abused: Not on file  . Physically Abused: Not on file  . Sexually Abused: Not on file     Review of Systems  All other systems reviewed and are negative.      Objective:   Physical Exam Vitals reviewed.  Constitutional:      Appearance: Normal appearance.  Cardiovascular:     Rate and Rhythm: Normal rate and regular rhythm.     Pulses: Normal pulses.     Heart sounds: Normal heart sounds. No murmur. No friction rub. No gallop.   Pulmonary:     Effort: Pulmonary effort is normal. No respiratory distress.     Breath sounds: Normal breath sounds. No  wheezing, rhonchi or rales.  Abdominal:     General: Abdomen is flat. Bowel sounds are normal.     Palpations: Abdomen is soft.  Neurological:     Mental Status: He is alert.        Assessment:    HSV-2 seropositive      Plan:      I explained to the patient that an IgG level indicates a remote infection with HSV-2.  I explained to the patient that I am unable to tell when he had the infection based on his lab work but it was likely more than 2 months ago.  I explained that he could be a carrier and have a latent version of this virus.  I explained that he can be contagious 1 to 2 days before an outbreak visibly develops.  Therefore I recommended that he avoid sexual activity if he experiences itching or burning in his genital area or if he sees a rash.  I showed the patient several pictures demonstrating an HSV rash.  We discussed taking Valtrex or acyclovir to prevent outbreaks and at the present time the patient has elected simply to monitor for any future outbreaks as he does not recall having an outbreak in the past.

## 2019-05-28 ENCOUNTER — Telehealth: Payer: Self-pay | Admitting: Family Medicine

## 2019-05-28 NOTE — Telephone Encounter (Addendum)
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

## 2019-05-28 NOTE — Telephone Encounter (Signed)
Please finish this note

## 2019-05-31 NOTE — Telephone Encounter (Signed)
Spoke with pt will fax over forms fmla

## 2019-06-20 DIAGNOSIS — F259 Schizoaffective disorder, unspecified: Secondary | ICD-10-CM | POA: Diagnosis not present

## 2019-06-20 DIAGNOSIS — Z20822 Contact with and (suspected) exposure to covid-19: Secondary | ICD-10-CM | POA: Diagnosis not present

## 2019-07-11 DIAGNOSIS — R030 Elevated blood-pressure reading, without diagnosis of hypertension: Secondary | ICD-10-CM | POA: Diagnosis not present

## 2019-07-11 DIAGNOSIS — S96911A Strain of unspecified muscle and tendon at ankle and foot level, right foot, initial encounter: Secondary | ICD-10-CM | POA: Diagnosis not present

## 2019-07-11 DIAGNOSIS — X500XXA Overexertion from strenuous movement or load, initial encounter: Secondary | ICD-10-CM | POA: Diagnosis not present

## 2019-07-30 ENCOUNTER — Other Ambulatory Visit: Payer: Self-pay

## 2019-07-30 ENCOUNTER — Ambulatory Visit (INDEPENDENT_AMBULATORY_CARE_PROVIDER_SITE_OTHER): Payer: Medicare Other | Admitting: Family Medicine

## 2019-07-30 VITALS — BP 150/78 | HR 108 | Temp 96.4°F | Ht 69.0 in | Wt 193.0 lb

## 2019-07-30 DIAGNOSIS — F3110 Bipolar disorder, current episode manic without psychotic features, unspecified: Secondary | ICD-10-CM | POA: Diagnosis not present

## 2019-07-30 MED ORDER — ARIPIPRAZOLE 10 MG PO TABS: 10.0000 mg | ORAL_TABLET | Freq: Every day | ORAL | 1 refills | Status: DC
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
## 2019-08-21 ENCOUNTER — Encounter (HOSPITAL_COMMUNITY): Payer: Self-pay | Admitting: *Deleted

## 2019-08-21 ENCOUNTER — Other Ambulatory Visit: Payer: Self-pay

## 2019-08-21 ENCOUNTER — Emergency Department (HOSPITAL_COMMUNITY)
Admission: EM | Admit: 2019-08-21 | Discharge: 2019-08-24 | Disposition: A | Discharge status: Other Acute Inpatient Hospital | Payer: Medicaid Other | Attending: Emergency Medicine | Admitting: Emergency Medicine

## 2019-08-21 DIAGNOSIS — F209 Schizophrenia, unspecified: Secondary | ICD-10-CM | POA: Diagnosis not present

## 2019-08-21 DIAGNOSIS — F918 Other conduct disorders: Secondary | ICD-10-CM | POA: Insufficient documentation

## 2019-08-21 DIAGNOSIS — R4689 Other symptoms and signs involving appearance and behavior: Secondary | ICD-10-CM

## 2019-08-21 DIAGNOSIS — Z20822 Contact with and (suspected) exposure to covid-19: Secondary | ICD-10-CM | POA: Insufficient documentation

## 2019-08-21 LAB — CBC WITH DIFFERENTIAL/PLATELET
Abs Immature Granulocytes: 0.01 10*3/uL (ref 0.00–0.07)
Basophils Absolute: 0 10*3/uL (ref 0.0–0.1)
Basophils Relative: 0 %
Eosinophils Absolute: 0.1 10*3/uL (ref 0.0–0.5)
Eosinophils Relative: 1 %
HCT: 43.8 % (ref 39.0–52.0)
Hemoglobin: 13.9 g/dL (ref 13.0–17.0)
Immature Granulocytes: 0 %
Lymphocytes Relative: 30 %
Lymphs Abs: 2.2 10*3/uL (ref 0.7–4.0)
MCH: 27.6 pg (ref 26.0–34.0)
MCHC: 31.7 g/dL (ref 30.0–36.0)
MCV: 86.9 fL (ref 80.0–100.0)
Monocytes Absolute: 0.4 10*3/uL (ref 0.1–1.0)
Monocytes Relative: 6 %
Neutro Abs: 4.5 10*3/uL (ref 1.7–7.7)
Neutrophils Relative %: 63 %
Platelets: 244 10*3/uL (ref 150–400)
RBC: 5.04 MIL/uL (ref 4.22–5.81)
RDW: 13.9 % (ref 11.5–15.5)
WBC: 7.2 10*3/uL (ref 4.0–10.5)
nRBC: 0 % (ref 0.0–0.2)

## 2019-08-21 LAB — RAPID URINE DRUG SCREEN, HOSP PERFORMED
Amphetamines: NOT DETECTED
Barbiturates: NOT DETECTED
Benzodiazepines: NOT DETECTED
Cocaine: NOT DETECTED
Opiates: NOT DETECTED
Tetrahydrocannabinol: NOT DETECTED

## 2019-08-21 LAB — COMPREHENSIVE METABOLIC PANEL
ALT: 30 U/L (ref 0–44)
AST: 29 U/L (ref 15–41)
Albumin: 4.1 g/dL (ref 3.5–5.0)
Alkaline Phosphatase: 69 U/L (ref 38–126)
Anion gap: 9 (ref 5–15)
BUN: 17 mg/dL (ref 6–20)
CO2: 23 mmol/L (ref 22–32)
Calcium: 8.8 mg/dL — ABNORMAL LOW (ref 8.9–10.3)
Chloride: 104 mmol/L (ref 98–111)
Creatinine, Ser: 0.9 mg/dL (ref 0.61–1.24)
GFR calc Af Amer: >60 mL/min (ref >60)
GFR calc non Af Amer: >60 mL/min (ref >60)
Glucose, Bld: 102 mg/dL — ABNORMAL HIGH (ref 70–99)
Potassium: 3.9 mmol/L (ref 3.5–5.1)
Sodium: 136 mmol/L (ref 135–145)
Total Bilirubin: 0.5 mg/dL (ref 0.3–1.2)
Total Protein: 7.4 g/dL (ref 6.5–8.1)

## 2019-08-21 LAB — ETHANOL: Alcohol, Ethyl (B): <10 mg/dL (ref <10)

## 2019-08-21 MED ORDER — STERILE WATER FOR INJECTION IJ SOLN
INTRAMUSCULAR | Status: AC
  Filled 2019-08-21: qty 10

## 2019-08-21 MED ORDER — ZIPRASIDONE MESYLATE 20 MG IM SOLR
20.0000 mg | Freq: Once | INTRAMUSCULAR | Status: AC
  Administered 2019-08-21: 20 mg via INTRAMUSCULAR
  Filled 2019-08-21: qty 20

## 2019-08-21 MED ORDER — ARIPIPRAZOLE 5 MG PO TABS
10.0000 mg | ORAL_TABLET | Freq: Every day | ORAL | Status: DC
  Administered 2019-08-21 – 2019-08-23 (×3): 10 mg via ORAL
  Filled 2019-08-21 (×3): qty 2

## 2019-08-21 NOTE — ED Notes (Signed)
Pt dressed out in scrubs  
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
## 2019-08-21 NOTE — ED Notes (Signed)
Pt pacing back and forth in room, responding to internal stimuli.
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
## 2019-08-21 NOTE — ED Notes (Signed)
PT belongings in locker.

## 2019-08-21 NOTE — ED Notes (Signed)
Patient was given breakfast tray. 
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
## 2019-08-21 NOTE — ED Notes (Signed)
Pt pacing room. Standing in doorway staring at staff.

## 2019-08-21 NOTE — ED Notes (Signed)
Pt pacing in room, laugh out loud inappropriately (no stimulus for humor)

## 2019-08-21 NOTE — ED Notes (Signed)
Pt pacing room.  

## 2019-08-21 NOTE — ED Notes (Signed)
Pt ambulatory to bathroom. Pt now pacing in room
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
## 2019-08-21 NOTE — ED Notes (Signed)
Pt yelling and cussing at staff when asked to keep curtains open. Pt pacing around room. [employer] called and EDP asked for medications.

## 2019-08-21 NOTE — ED Notes (Signed)
Patient in room responding to internal stimuli. Laughing and pacing room.

## 2019-08-22 LAB — SARS CORONAVIRUS 2 BY RT PCR (HOSPITAL ORDER, PERFORMED IN ~~LOC~~ HOSPITAL LAB): SARS Coronavirus 2: NEGATIVE

## 2019-08-22 MED ORDER — ACETAMINOPHEN 325 MG PO TABS
650.0000 mg | ORAL_TABLET | Freq: Four times a day (QID) | ORAL | Status: DC | PRN
  Administered 2019-08-22: 650 mg via ORAL
  Filled 2019-08-22: qty 2

## 2019-08-22 NOTE — ED Notes (Signed)
Forensic restraints removed by Technical sales engineer.

## 2019-08-22 NOTE — BH Assessment (Addendum)
During today's meets this am, Dr. Dwyane Dee indicated that patient met inpatient criteria.

## 2019-08-23 ENCOUNTER — Ambulatory Visit: Payer: Medicare Other | Admitting: Family Medicine

## 2019-08-23 MED ORDER — LORAZEPAM 2 MG/ML IJ SOLN
1.0000 mg | Freq: Once | INTRAMUSCULAR | Status: AC
  Administered 2019-08-23: 1 mg via INTRAMUSCULAR
  Filled 2019-08-23: qty 1

## 2019-08-23 NOTE — BH Assessment (Addendum)
Reassessment Note: Pt presents restless, sitting on edge of his bed. His speech became louder and more pressured during session. This writer attempted to calm and redirect him before terminating. Pt reports he is angry with his family for petitioning him, noting there was no basis for involuntary commitment. Pt states he feels like he is locked up in a cage. Pt denies SI, HI and AVH. He reports he called his MD for a refill of medications but never heard back. Pt also states he has not missed any doses & has been compliant. Inpt psychiatric tx continues to be recommendation.

## 2019-08-23 NOTE — ED Notes (Signed)
Pt accepted to [facility] after 8 am tomorrow

## 2019-08-23 NOTE — Progress Notes (Signed)
Pt accepted to [facility]  Dr. Estill Cotta is the accepting/attending provider.    Call report to [phone]  Eboni @ [facility] ED notified.     Pt is under IVC and will be transported by Patent examiner.    Pt is scheduled to arrive at [facility] on 7/16 after 8am.    Wells Guiles, LCSW, LCAS Disposition CSW [facility] [phone]

## 2019-08-23 NOTE — ED Notes (Signed)
Pt states medication helped him a lot and he feels better than he did and not as anxious.

## 2019-08-23 NOTE — ED Provider Notes (Signed)
Patient has been in emergency department for approximately 55 hours, patient has currently in psych hold awaiting TTS disposition.  I was asked by nurse to order patient medication for agitation.  Patient became agitated due to seeing someone else in the hallway.  Does not appear patient has any documented adverse reaction to benzodiazepines.  1 mg intramuscular Ativan was ordered.  I reassessed patient approximately 10 minutes later, he is sitting upright in bed, no acute distress appears calm.   Note: Portions of this report may have been transcribed using voice recognition software. Every effort was made to ensure accuracy; however, inadvertent computerized transcription errors may still be present.   Elizabeth Palau 08/23/19 1443    Terrilee Files, MD 08/23/19 [phone]
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
## 2019-08-23 NOTE — Progress Notes (Signed)
Pt meets inpatient criteria per Dr. Lucianne Muss. Referral information has been faxed to the following hospitals for review:    [facility]  [facility]  [facility]  [facility]  [facility]  [facility]  [facility]  [facility]  [facility]  [facility]  [facility]    Disposition will continue to follow.    Wells Guiles, LCSW, LCAS Disposition CSW [facility]S [phone]

## 2019-08-24 DIAGNOSIS — R45850 Homicidal ideations: Secondary | ICD-10-CM | POA: Diagnosis not present

## 2019-08-24 DIAGNOSIS — F17210 Nicotine dependence, cigarettes, uncomplicated: Secondary | ICD-10-CM | POA: Diagnosis not present

## 2019-08-24 DIAGNOSIS — G8929 Other chronic pain: Secondary | ICD-10-CM | POA: Diagnosis not present

## 2019-08-24 DIAGNOSIS — F209 Schizophrenia, unspecified: Secondary | ICD-10-CM | POA: Diagnosis not present

## 2019-08-24 DIAGNOSIS — Z9114 Patient's other noncompliance with medication regimen: Secondary | ICD-10-CM | POA: Diagnosis not present

## 2019-08-24 DIAGNOSIS — M542 Cervicalgia: Secondary | ICD-10-CM | POA: Diagnosis not present

## 2019-08-24 DIAGNOSIS — J45909 Unspecified asthma, uncomplicated: Secondary | ICD-10-CM | POA: Diagnosis not present
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
## 2019-08-24 NOTE — ED Notes (Signed)
Pt awake ; talking to self in room . Adjusted sheets in the bed.

## 2019-08-24 NOTE — ED Notes (Signed)
Attempt to give report to [facility], no answer to phone.  Services says "not available".

## 2019-09-07 ENCOUNTER — Other Ambulatory Visit: Payer: Self-pay

## 2019-09-07 ENCOUNTER — Encounter (HOSPITAL_COMMUNITY): Payer: Self-pay | Admitting: *Deleted

## 2019-09-07 ENCOUNTER — Emergency Department (HOSPITAL_COMMUNITY)
Admission: EM | Admit: 2019-09-07 | Discharge: 2019-09-24 | Disposition: A | Discharge status: Other Acute Inpatient Hospital | Payer: Medicaid Other | Attending: Emergency Medicine | Admitting: Emergency Medicine

## 2019-09-07 DIAGNOSIS — F250 Schizoaffective disorder, bipolar type: Secondary | ICD-10-CM | POA: Diagnosis present

## 2019-09-07 DIAGNOSIS — F209 Schizophrenia, unspecified: Secondary | ICD-10-CM | POA: Diagnosis not present

## 2019-09-07 DIAGNOSIS — Z20822 Contact with and (suspected) exposure to covid-19: Secondary | ICD-10-CM | POA: Diagnosis not present

## 2019-09-07 DIAGNOSIS — S8991XA Unspecified injury of right lower leg, initial encounter: Secondary | ICD-10-CM | POA: Diagnosis not present

## 2019-09-07 DIAGNOSIS — Z87891 Personal history of nicotine dependence: Secondary | ICD-10-CM | POA: Diagnosis not present

## 2019-09-07 DIAGNOSIS — Z7289 Other problems related to lifestyle: Secondary | ICD-10-CM

## 2019-09-07 DIAGNOSIS — Z789 Other specified health status: Secondary | ICD-10-CM

## 2019-09-07 DIAGNOSIS — R4689 Other symptoms and signs involving appearance and behavior: Secondary | ICD-10-CM

## 2019-09-07 LAB — CBC WITH DIFFERENTIAL/PLATELET
Abs Immature Granulocytes: 0.01 10*3/uL (ref 0.00–0.07)
Basophils Absolute: 0 10*3/uL (ref 0.0–0.1)
Basophils Relative: 0 %
Eosinophils Absolute: 0.1 10*3/uL (ref 0.0–0.5)
Eosinophils Relative: 2 %
HCT: 41.5 % (ref 39.0–52.0)
Hemoglobin: 13.5 g/dL (ref 13.0–17.0)
Immature Granulocytes: 0 %
Lymphocytes Relative: 33 %
Lymphs Abs: 2.6 10*3/uL (ref 0.7–4.0)
MCH: 27.9 pg (ref 26.0–34.0)
MCHC: 32.5 g/dL (ref 30.0–36.0)
MCV: 85.7 fL (ref 80.0–100.0)
Monocytes Absolute: 0.6 10*3/uL (ref 0.1–1.0)
Monocytes Relative: 8 %
Neutro Abs: 4.5 10*3/uL (ref 1.7–7.7)
Neutrophils Relative %: 57 %
Platelets: 232 10*3/uL (ref 150–400)
RBC: 4.84 MIL/uL (ref 4.22–5.81)
RDW: 13.8 % (ref 11.5–15.5)
WBC: 7.8 10*3/uL (ref 4.0–10.5)
nRBC: 0 % (ref 0.0–0.2)

## 2019-09-07 LAB — COMPREHENSIVE METABOLIC PANEL
ALT: 31 U/L (ref 0–44)
AST: 31 U/L (ref 15–41)
Albumin: 4.3 g/dL (ref 3.5–5.0)
Alkaline Phosphatase: 65 U/L (ref 38–126)
Anion gap: 8 (ref 5–15)
BUN: 15 mg/dL (ref 6–20)
CO2: 25 mmol/L (ref 22–32)
Calcium: 8.9 mg/dL (ref 8.9–10.3)
Chloride: 105 mmol/L (ref 98–111)
Creatinine, Ser: 1 mg/dL (ref 0.61–1.24)
GFR calc Af Amer: >60 mL/min (ref >60)
GFR calc non Af Amer: >60 mL/min (ref >60)
Glucose, Bld: 88 mg/dL (ref 70–99)
Potassium: 3.6 mmol/L (ref 3.5–5.1)
Sodium: 138 mmol/L (ref 135–145)
Total Bilirubin: 0.4 mg/dL (ref 0.3–1.2)
Total Protein: 7.6 g/dL (ref 6.5–8.1)

## 2019-09-07 LAB — RAPID URINE DRUG SCREEN, HOSP PERFORMED
Amphetamines: NOT DETECTED
Barbiturates: NOT DETECTED
Benzodiazepines: NOT DETECTED
Cocaine: NOT DETECTED
Opiates: NOT DETECTED
Tetrahydrocannabinol: NOT DETECTED

## 2019-09-07 LAB — ETHANOL: Alcohol, Ethyl (B): 32 mg/dL — ABNORMAL HIGH (ref <10)

## 2019-09-07 MED ORDER — ARIPIPRAZOLE 5 MG PO TABS
10.0000 mg | ORAL_TABLET | Freq: Every day | ORAL | Status: DC
  Administered 2019-09-07 – 2019-09-12 (×6): 10 mg via ORAL
  Filled 2019-09-07 (×6): qty 2

## 2019-09-07 MED ORDER — ZOLPIDEM TARTRATE 5 MG PO TABS: 5.0000 mg | ORAL_TABLET | Freq: Every evening | ORAL | Status: DC | PRN

## 2019-09-07 MED ORDER — IBUPROFEN 400 MG PO TABS
600.0000 mg | ORAL_TABLET | Freq: Three times a day (TID) | ORAL | Status: DC | PRN
  Administered 2019-09-08 – 2019-09-23 (×8): 600 mg via ORAL
  Filled 2019-09-07 (×10): qty 2

## 2019-09-07 MED ORDER — ONDANSETRON HCL 4 MG PO TABS: 4.0000 mg | ORAL_TABLET | Freq: Three times a day (TID) | ORAL | Status: DC | PRN

## 2019-09-07 NOTE — ED Notes (Signed)
Pt wanded by security.  Pt's clothes and shoes placed in locker room.  Pt's jewelry, wallet, and fitbit locked up with security.

## 2019-09-07 NOTE — ED Notes (Signed)
TTS in progress 

## 2019-09-07 NOTE — ED Triage Notes (Signed)
Pt states that he is not on the right medication, admits to thoughts of hitting his mother, pt rambles in conversation about "blowing up" his car,  pt states that his mom is not on the right medication and so he decided to voluntary admit himself,

## 2019-09-07 NOTE — ED Provider Notes (Signed)
[facility] EMERGENCY DEPARTMENT Provider Note   CSN: [id number] Arrival date & time: 09/07/19  1905     History Chief Complaint  Patient presents with  . V70.1    Brad Moreno is a 39 y.o. male.  HPI   Patient presents to the ED indicating he needs to see a mental health person.  Patient has a history of schizophrenia and psychosis.  Patient feels like his meds are not right.  He has been thinking about hitting his mother.  He mentioned to the nurses about possibly blowing up his car.  He denies feeling suicidal.  Past Medical History:  Diagnosis Date  . Bipolar 1 disorder (HCC)   . Depression 06/2010  . Psychosis (HCC) 03/23/2017  . Schizophrenia [facility])     Patient Active Problem List   Diagnosis Date Noted  . Current smoker 03/29/2018  . Class 1 obesity with body mass index (BMI) of 33.0 to 33.9 in adult 03/29/2018  . Alcohol use 02/17/2017  . Bipolar disorder, unspecified (HCC) 06/09/2010    Past Surgical History:  Procedure Laterality Date  . WISDOM TOOTH EXTRACTION         Family History  Problem Relation Age of Onset  . Depression Mother   . Diabetes Mother   . Cancer Paternal Grandfather 21       Colon Cancer  . Alcohol abuse Brother        Was Heavy drinker and smoker--has quit  . Bipolar disorder Maternal Uncle     Social History   Tobacco Use  . Smoking status: Former Smoker    Packs/day: 0.25    Years: 1.00    Pack years: 0.25    Quit date: 02/01/2019    Years since quitting: 0.5  . Smokeless tobacco: Never Used  Vaping Use  . Vaping Use: Never used  Substance Use Topics  . Alcohol use: Yes    Alcohol/week: 3.0 standard drinks    Types: 3 Cans of beer per week    Comment: maybe 6 on weekends only  . Drug use: Not Currently    Types: Marijuana    Comment: marijuana last used 06/16/17    Home Medications Prior to Admission medications   Medication Sig Start Date End Date Taking? Authorizing Provider  ARIPiprazole (ABILIFY) 10  MG tablet Take 1 tablet (10 mg total) by mouth daily. 07/30/19   Donita Brooks, MD    Allergies    Amoxicillin and Pork-derived products  Review of Systems   Review of Systems  All other systems reviewed and are negative.   Physical Exam Updated Vital Signs BP (!) 132/91   Pulse (!) 112   Temp 98.3 F (36.8 C) (Oral)   Resp 20   Ht 1.753 m (5\' 9" )   Wt 87 kg   SpO2 100%   BMI 28.32 kg/m   Physical Exam Vitals and nursing note reviewed.  Constitutional:      General: He is not in acute distress.    Appearance: He is well-developed.  HENT:     Head: Normocephalic and atraumatic.     Right Ear: External ear normal.     Left Ear: External ear normal.  Eyes:     General: No scleral icterus.       Right eye: No discharge.        Left eye: No discharge.     Conjunctiva/sclera: Conjunctivae normal.  Neck:     Trachea: No tracheal deviation.  Cardiovascular:  Rate and Rhythm: Normal rate and regular rhythm.  Pulmonary:     Effort: Pulmonary effort is normal. No respiratory distress.     Breath sounds: Normal breath sounds. No stridor. No wheezing or rales.  Abdominal:     General: Bowel sounds are normal. There is no distension.     Palpations: Abdomen is soft.     Tenderness: There is no abdominal tenderness. There is no guarding or rebound.  Musculoskeletal:        General: No tenderness.     Cervical back: Neck supple.  Skin:    General: Skin is warm and dry.     Findings: No rash.  Neurological:     Mental Status: He is alert.     Cranial Nerves: No cranial nerve deficit (no facial droop, extraocular movements intact, no slurred speech).     Sensory: No sensory deficit.     Motor: No abnormal muscle tone or seizure activity.     Coordination: Coordination normal.  Psychiatric:        Mood and Affect: Affect is flat.        Speech: Speech is not rapid and pressured or slurred.        Behavior: Behavior is not agitated, aggressive or hyperactive.         Thought Content: Thought content does not include suicidal ideation.     ED Results / Procedures / Treatments   Labs (all labs ordered are listed, but only abnormal results are displayed) Labs Reviewed  ETHANOL - Abnormal; Notable for the following components:      Result Value   Alcohol, Ethyl (B) 32 (*)    All other components within normal limits  SARS CORONAVIRUS 2 BY RT PCR (HOSPITAL ORDER, PERFORMED IN [facility])  COMPREHENSIVE METABOLIC PANEL  RAPID URINE DRUG SCREEN, HOSP PERFORMED  CBC WITH DIFFERENTIAL/PLATELET    EKG None  Radiology No results found.  Procedures Procedures (including critical care time)  Medications Ordered in ED Medications  ARIPiprazole (ABILIFY) tablet 10 mg (10 mg Oral Given 09/07/19 2205)  ondansetron (ZOFRAN) tablet 4 mg (has no administration in time range)  ibuprofen (ADVIL) tablet 600 mg (has no administration in time range)  zolpidem (AMBIEN) tablet 5 mg (has no administration in time range)    ED Course  I have reviewed the triage vital signs and the nursing notes.  Pertinent labs & imaging results that were available during my care of the patient were reviewed by me and considered in my medical decision making (see chart for details).  Clinical Course as of Sep 07 2303  Fri Sep 07, 2019  2247 Labs reviewed.  No significant abnormalities.  Patient is medically cleared   [JK]    Clinical Course User Index [JK] Linwood Dibbles, MD   MDM Rules/Calculators/A&P                          Pt presents with thoughts of harming his mother.  Calm here.   Medically cleared.   Dispo pending psych evaluation. Final Clinical Impression(s) / ED Diagnoses Final diagnoses:  Aggressive behavior      Linwood Dibbles, MD 09/07/19 2305

## 2019-09-07 NOTE — ED Notes (Signed)
Pt dressed in burgundy scrubs

## 2019-09-08 ENCOUNTER — Encounter (HOSPITAL_COMMUNITY): Payer: Self-pay | Admitting: Emergency Medicine

## 2019-09-08 LAB — SARS CORONAVIRUS 2 BY RT PCR (HOSPITAL ORDER, PERFORMED IN ~~LOC~~ HOSPITAL LAB): SARS Coronavirus 2: NEGATIVE

## 2019-09-08 MED ORDER — MELATONIN 3 MG PO TABS
6.0000 mg | ORAL_TABLET | Freq: Every day | ORAL | Status: DC
  Administered 2019-09-08 – 2019-09-23 (×16): 6 mg via ORAL
  Filled 2019-09-08 (×16): qty 2

## 2019-09-08 MED ORDER — MELATONIN 5 MG PO TABS
5.0000 mg | ORAL_TABLET | Freq: Every day | ORAL | Status: DC
  Filled 2019-09-08 (×2): qty 1

## 2019-09-08 MED ORDER — DIVALPROEX SODIUM 250 MG PO DR TAB
500.0000 mg | DELAYED_RELEASE_TABLET | Freq: Two times a day (BID) | ORAL | Status: DC
  Administered 2019-09-08 – 2019-09-13 (×11): 500 mg via ORAL
  Filled 2019-09-08 (×11): qty 2

## 2019-09-08 NOTE — ED Provider Notes (Addendum)
[facility] TTS states they have evaluated patient and recommend inpatient admission.  Patient is voluntary.      Devoria Albe, MD 09/08/19 8875    Devoria Albe, MD 09/08/19 [phone]

## 2019-09-08 NOTE — ED Provider Notes (Signed)
Patient reports he takes depakoate BID. Will re-order   Pricilla Loveless, MD 09/08/19 [phone]

## 2019-09-08 NOTE — Progress Notes (Signed)
Patient ID: Brad Moreno, male   DOB: [birth year], 39 y.o.   MRN: [id number]   Per previous assessment:  Patient presents to the ED indicating he needs to see a mental health person. Patient has a history of schizophrenia and psychosis. Patient feels like his meds are not right. He has been thinking about hitting his mother. He mentioned to the nurses about possibly blowing up his car. He denies feeling suicidal. 09-08-19: Patient is currently awaiting placement. Continue current plan of care as already in progress.

## 2019-09-08 NOTE — ED Notes (Signed)
Pt ambulated to restroom. Pt given diet sprite per request.

## 2019-09-08 NOTE — Progress Notes (Signed)
Patient meets criteria for inpatient treatment. There are no available beds at [facility] currently. CSW faxed referrals to the following facilities for review:  [facility] Orlie Pollen  TTS will continue to seek bed placement.   Trula Slade, MSW, LCSW Clinical Social Worker 09/08/2019 8:18 AM

## 2019-09-08 NOTE — ED Notes (Signed)
Pt ambulated to restroom. Pt laughing in room with no lights or tv on.
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
## 2019-09-08 NOTE — ED Notes (Signed)
Pt resting quietly. No voiced concerns. Pleasant and cooperative at this time.

## 2019-09-08 NOTE — ED Notes (Signed)
Pt doing push-ups in room and laughing.

## 2019-09-08 NOTE — ED Notes (Signed)
Meal provided 

## 2019-09-09 ENCOUNTER — Emergency Department (HOSPITAL_COMMUNITY): Payer: Medicaid Other

## 2019-09-09 DIAGNOSIS — S8991XA Unspecified injury of right lower leg, initial encounter: Secondary | ICD-10-CM | POA: Diagnosis not present

## 2019-09-09 MED ORDER — STERILE WATER FOR INJECTION IJ SOLN
INTRAMUSCULAR | Status: AC
  Filled 2019-09-09: qty 10

## 2019-09-09 MED ORDER — ZIPRASIDONE MESYLATE 20 MG IM SOLR
10.0000 mg | Freq: Once | INTRAMUSCULAR | Status: AC
  Administered 2019-09-09: 10 mg via INTRAMUSCULAR
  Filled 2019-09-09: qty 20

## 2019-09-09 NOTE — ED Notes (Signed)
Pt moved back to 15 for safety reasons and placed on Psych hospital bed.  Place in soft restraints.  Pt is cussing and calling staff names.

## 2019-09-09 NOTE — ED Notes (Signed)
Pt continuously yelling out.
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
## 2019-09-09 NOTE — ED Notes (Addendum)
No problems or outbursts observed  While pt was showering. Pt given dinner meal tray-[employer] remain at bedside. R leg placed back in shackles for safety measure.

## 2019-09-09 NOTE — ED Notes (Signed)
Pt requesting to take shower. Brad Moreno, hospital security and [employer] Officer Daphine Deutscher present with pt. Pt informed that he will have 10 minutes time limit on taking shower. Supplies given to pt including soap, wash cloth, deodorant, and clean scrubs.

## 2019-09-09 NOTE — ED Notes (Signed)
Have not been able to obtain VS.

## 2019-09-09 NOTE — ED Provider Notes (Addendum)
Emergency Medicine Observation Re-evaluation Note  Brad Moreno is a 39 y.o. male, seen on rounds today.  Pt initially presented to the ED for complaints of V70.1 Currently, the patient is alert, content, not agitated or distressed. No active hallucinations or delusions.   Physical Exam  BP 121/72 (BP Location: Right Arm)   Pulse 79   Temp 97.7 F (36.5 C) (Oral)   Resp 18   Ht 1.753 m (5\' 9" )   Wt 87 kg   SpO2 100%   BMI 28.32 kg/m  Physical Exam   Psych, pt is calm, normal mood/affect. No current agitation or hallucinations.   ED Course / MDM  EKG:  Clinical Course as of Sep 08 917  Fri Sep 07, 2019  2247 Labs reviewed.  No significant abnormalities.  Patient is medically cleared   [JK]    Clinical Course User Index [JK] 2248, MD   I have reviewed the labs performed to date as well as medications administered while in observation.  Recent changes in the last 24 hours include pt started back on depakote last PM.  Plan  Current plan is for [facility] team is reassessing, and continues to work on placement.   Pt became acutely agitated, yelling, threatening. Attempted to be calmed. Pt trying to leave. BH team advising psych admission. IVC papers completed. geodon im. Order was provided for restraint for agitation/aggressive behavior for patient and staff safety (order not placed contemporaneously as 'order' in Epic due to being involved emergently in care of other critical patient). Patient was reassessed subsequent to meds given and was calm/alert and in no acute distress.         [facility], MD 09/09/19 1541       11/09/19, MD 09/19/19 1649

## 2019-09-09 NOTE — ED Notes (Signed)
Pt asleep.

## 2019-09-09 NOTE — ED Notes (Signed)
Soft restraints removed  

## 2019-09-09 NOTE — ED Notes (Signed)
IVC paperwork faxed to magistrate.  

## 2019-09-09 NOTE — ED Notes (Signed)
Sheriff is here to serve papers.

## 2019-09-09 NOTE — BHH Counselor (Signed)
Re-assessment:   Patient initially assessed on 09/08/2019 Patient presents to the ED indicating he needs to see a mental health person. Patient has a history of schizophrenia and psychosis. Patient feels like his meds are not right. He has been thinking about hitting his mother. He mentioned to the nurses about possibly blowing up his car.   During the re-assessment patient verbally aggressive by cursing and sitting he with arms folded. He stated, "if you'll going to send me back to [facility] lets get going, then he clapped his hands together." Patient stated, "I am going to fucking hurt somebody." Using vulgar language as she spoke patient discussed how he wants to kill people living at his address. He stated, "I want to kill people living at my fucking address. Don't ask me my fucking address cause you already know my fucking address. If the people was alive when the [facility] picked me up then bye bye." Patient then abruptly ended the tele-psych.   Disposition: Patient continues to be recommended for inpatient tx

## 2019-09-09 NOTE — ED Notes (Signed)
Pt escorted to shower with NT and provided clean maroon scrubs, socks, towels, soap, tooth paste and tooth brush as well as Deoderant. Pt able to perform ADLs without assistance. Pt cooperative and following commands at this time.

## 2019-09-09 NOTE — ED Notes (Signed)
Pt assisted to bathroom by police and myself

## 2019-09-09 NOTE — ED Notes (Signed)
Pt is secured to room by On Duty Officer and is Emergency IVC'd.

## 2019-09-09 NOTE — ED Notes (Signed)
Dinner tray is here pt up eating

## 2019-09-09 NOTE — ED Notes (Signed)
Pt in room talking out loud, not to anyone in particular. Sitter reports pt appears more agitated at this time. This nurse to pt room- pt expressing that he wants xray of his knee b/c of pain from kicking door earlier in the day. EDP made aware- port Xray ordered. PT does appear more agitated at this time. [employer], officer Daphine Deutscher made aware of this and remains at bedside. Pt was offered ice pack for knee and refused.

## 2019-09-09 NOTE — Progress Notes (Signed)
Pt in review by [facility]. CSW continuing to assess.  Roshaun Pound S. Alan Ripper, MSW, LCSW Clinical Social Worker 09/09/2019 2:21 PM

## 2019-09-09 NOTE — ED Notes (Signed)
While patient was ambulating to the bathroom with police he stated "I'm going to break one of these girls' necks here" and looked toward the staff at the desk. PT corrected by the deputy at that time and went to the bathroom and returned to his room.

## 2019-09-09 NOTE — ED Notes (Signed)
Transferred to 16 for pt privacy.

## 2019-09-09 NOTE — ED Notes (Signed)
Pt made 1 phone call to try to reach his mother. Pt could not reach mother at this time.

## 2019-09-09 NOTE — ED Notes (Signed)
Pt is verbally abusive and Pt is hitting things.  Knock water over and hit table.  Pt called nurse into room and began cussing, saying, "call Sam Page, so them Mother Fucker's can tie me to the bed."  Security and Officer called to ED.  Pt taken back in his room by Officer.

## 2019-09-10 MED ORDER — STERILE WATER FOR INJECTION IJ SOLN
INTRAMUSCULAR | Status: AC
  Filled 2019-09-10: qty 10

## 2019-09-10 MED ORDER — ZIPRASIDONE MESYLATE 20 MG IM SOLR
INTRAMUSCULAR | Status: AC
  Administered 2019-09-10: 20 mg via INTRAMUSCULAR
  Filled 2019-09-10: qty 20

## 2019-09-10 MED ORDER — HYDROXYZINE HCL 25 MG PO TABS
50.0000 mg | ORAL_TABLET | Freq: Once | ORAL | Status: AC
  Administered 2019-09-10: 50 mg via ORAL
  Filled 2019-09-10: qty 2

## 2019-09-10 MED ORDER — ZIPRASIDONE MESYLATE 20 MG IM SOLR: 20.0000 mg | Freq: Once | INTRAMUSCULAR | Status: AC

## 2019-09-10 MED ORDER — MAGNESIUM HYDROXIDE 400 MG/5ML PO SUSP
30.0000 mL | Freq: Every day | ORAL | Status: DC | PRN
  Administered 2019-09-10 – 2019-09-13 (×3): 30 mL via ORAL
  Filled 2019-09-10 (×4): qty 30

## 2019-09-10 NOTE — ED Notes (Signed)
Pt sitting on side of the bed. Ankle Restraints only. [employer] at bedside

## 2019-09-10 NOTE — ED Provider Notes (Signed)
Emergency Medicine Observation Re-evaluation Note  Brad Moreno is a 39 y.o. male, seen on rounds today.  Pt initially presented to the ED for complaints of V70.1 Currently, the patient is resting comfortably, in restraints.  Physical Exam  BP 115/80 (BP Location: Left Arm)   Pulse 67   Temp 98.1 F (36.7 C) (Oral)   Resp 20   Ht 5\' 9"  (1.753 m)   Wt 87 kg   SpO2 100%   BMI 28.32 kg/m  Physical Exam  ED Course / MDM  EKG:  Clinical Course as of Sep 10 806  Fri Sep 07, 2019  2247 Labs reviewed.  No significant abnormalities.  Patient is medically cleared   [JK]    Clinical Course User Index [JK] 2248, MD   I have reviewed the labs performed to date as well as medications administered while in observation.  Recent changes in the last 24 hours include requiring medications for agitation. Plan  Current plan is for inpatient bed search. Patient is under full IVC at this time.   Linwood Dibbles, MD 09/10/19 [phone]

## 2019-09-10 NOTE — ED Notes (Signed)
Pt singing out load to his self and not bothering anyone else.

## 2019-09-10 NOTE — ED Notes (Signed)
Pt broke his glassesConservator, museum/gallery in room looking for remnants of broken glasses. Pt requesting "something for sleep" -new orders received.

## 2019-09-10 NOTE — Progress Notes (Signed)
Pt pulled the wire out of his face mask and said he was going to stab staff in the eye

## 2019-09-10 NOTE — ED Notes (Signed)
Right ankle restraint removed by officer - will monitor pt behavior to assess signing off [employer] officer. Officer Daphine Deutscher has spoken with pt.

## 2019-09-10 NOTE — BH Assessment (Signed)
There are no available beds at [facility] currently. Re- faxed referrals to the following facilities for review and consideration of bed placement: [facility]      [facility]    [facility]    [facility]    [facility]     [facility] Details    [facility]    [facility]    [facility]     [facility]   [facility]    [facility]   [facility]   [facility]

## 2019-09-10 NOTE — ED Notes (Signed)
This nurse heard pt screaming at [facility]. "Just take me to jail bitch I don't give a fuck. I'm tired of being in the fucking hospital." Pt continues talk and state "just take me to jail I'm supposed to be in my cell. Call Sam Page bitch." [employer] at bedside. Security notified.

## 2019-09-10 NOTE — ED Notes (Signed)
Applied ace wrap to pt right knee per request. Pt is calm and cooperative at this time.

## 2019-09-10 NOTE — ED Notes (Signed)
Pt woke to use bathroom but was unable to. Restraints on both ankles and wrists. [employer] is at bedside.

## 2019-09-10 NOTE — ED Notes (Signed)
Pt goes from laying in bed to sitting up on the side of bed talking to himself.

## 2019-09-10 NOTE — ED Notes (Signed)
Police officer took off shackles and is visually monitoring pt. Nurse assessed ankles and no injury noted. Pt is calm at this time.

## 2019-09-11 ENCOUNTER — Encounter (HOSPITAL_COMMUNITY): Payer: Self-pay | Admitting: Emergency Medicine

## 2019-09-11 MED ORDER — ZIPRASIDONE MESYLATE 20 MG IM SOLR
20.0000 mg | Freq: Once | INTRAMUSCULAR | Status: AC
  Administered 2019-09-11: 20 mg via INTRAMUSCULAR
  Filled 2019-09-11: qty 20

## 2019-09-11 MED ORDER — HYDROXYZINE HCL 50 MG/ML IM SOLN
100.0000 mg | Freq: Four times a day (QID) | INTRAMUSCULAR | Status: DC | PRN
  Administered 2019-09-11 – 2019-09-18 (×2): 100 mg via INTRAMUSCULAR
  Filled 2019-09-11 (×2): qty 2

## 2019-09-11 MED ORDER — LORAZEPAM 2 MG/ML IJ SOLN
2.0000 mg | Freq: Once | INTRAMUSCULAR | Status: AC
  Administered 2019-09-11: 2 mg via INTRAMUSCULAR
  Filled 2019-09-11: qty 1

## 2019-09-11 NOTE — ED Notes (Signed)
Pt yelling and cursing at staff. Pt verbally threatening staff

## 2019-09-11 NOTE — ED Notes (Signed)
Pt awoke already threatening staff stated " if that nurse that give me giedon one more time Im get my homies to shot up this place!"

## 2019-09-11 NOTE — BH Assessment (Signed)
Reassessment Note: Pt presents lying in bed dressed in scrubs with ankle cuffed to bed. Pt is agitated, with loud and pressured speech. He is angry about restrictions in ED room and length of time it is taking for psychiatric placement. Pt's swearing and ranting tolerated for a few minutes before suggestions made to consider a more cooperative/gentle attitude toward staff caring for him. Pt appeared to calm & considered suggestions. His main concerns were wanting medication for knee pain, not wanting Geodon again, and needing to use the bathroom. Pt continues to meet inpt tx criteria.

## 2019-09-11 NOTE — ED Provider Notes (Signed)
Emergency Medicine Observation Re-evaluation Note  Brad Moreno is a 39 y.o. male, seen on rounds today.  Pt initially presented to the ED for complaints of V70.1 Currently, the patient is threatening "if the nurse gives me Geodon one more time I am going to get my home is to shoot up this place."  All enforcement is at his bedside.  Physical Exam  BP 140/90   Pulse 85   Temp 98.2 F (36.8 C) (Oral)   Resp 18   Ht 5\' 9"  (1.753 m)   Wt 87 kg   SpO2 100%   BMI 28.32 kg/m  Physical Exam Patient is talking to a counselor on the video monitor, expressing grandiose ideas, confabulating, paranoid, accusatory, with pressured speech. ED Course / MDM  EKG:  Clinical Course as of Sep 10 1004  Fri Sep 07, 2019  2247 Labs reviewed.  No significant abnormalities.  Patient is medically cleared   [JK]    Clinical Course User Index [JK] 2248, MD   I have reviewed the labs performed to date as well as medications administered while in observation.  Recent changes in the last 24 hours include persistent agitation, requiring multiple episodes of sedation.. Plan  Current plan is for placement in a psychiatric facility. Patient is under full IVC at this time.   Linwood Dibbles, MD 09/11/19 1013

## 2019-09-11 NOTE — ED Notes (Signed)
Pt is calm, cooperative  Reality therapy with verbal contract that pt will let nurse know when he become anxious or has need of anything   His forensic restraint is removed when he agrees to cooperate, with this nurse telling pt that he has frightened and threatened staff who are also nonplussed at his length of stay in this department  He asks to be treated with respect and he is assured he will be but is asked to also treat staff with respect and let them know when he requires something

## 2019-09-11 NOTE — ED Notes (Signed)
Pt asking for bed bath. Pt given items to bath in room.

## 2019-09-11 NOTE — ED Notes (Signed)
Pt escorted to restroom by sheriff.

## 2019-09-11 NOTE — ED Notes (Signed)
Pt ambulated to restroom and given diet ginger-ale per request. Pt continues looking into other pt's room, pt told not to invade other people's privacy. Pt pacing and singing in room at this time.

## 2019-09-11 NOTE — BH Assessment (Signed)
Clinician called [facility] at 0057 in an attempt to re-assess pt to determine if he continues to meet inpatient criteria. However, pt was asleep and was unable to be aroused at this time. TTS will contact pt's nurse at a later time in an attempt to complete pt's re-assessment, and the unit agreed to contact clinician if pt wakes up so the assessment can be completed.

## 2019-09-11 NOTE — ED Notes (Signed)
Pt asking for medications, went into take pt meds. Pt stated " fuck those meds man, I want them after I talk to someone".  Pt began his TTS once RN left the room

## 2019-09-11 NOTE — BH Assessment (Addendum)
Continue inpt tx., per TTS reassessment. Patient referred to the following facilities for consideration of placement:  [facility]  [facility]  [facility]  [facility]  [facility]  [facility]  [facility]  [facility]  [facility] Details  [facility]  [facility] Details  [facility]  [facility]  [facility]  [facility]  [facility]  [facility]  [facility]  [facility]  [facility]  [facility]   Clinician also faxed a referral to [facility] for consideration of acceptance to their wait list. Completed the [facility] phone referral with [name].Faxed a packet with labs, clinicals, etc to [facility]. Requested a [employer] Authorization number with Barrington Ellison (clinician at [facility]). Irving Burton advised this clinician to fax a packet to to [facility] at #[phone] and their UR department will review in the morning. LCSW will need to follow up 09/12/2019 to obtain that authorization from [employer] [phone] and provide that number to [facility].

## 2019-09-11 NOTE — ED Notes (Signed)
Pt continues to await placement   Is increasingly frustrated by length of stay and the noise and restrictions of boarding in the ED  He continues in a small room with no programming, no face to face assessment and with [employer] deputy's at roomside

## 2019-09-11 NOTE — ED Notes (Signed)
Pt requested to see RN. Pt rambling about his knee, the tv, being hungry, playing basketball and other topics all a once. RN notified pt he is waiting for a psych bed. Pt agitated and sitting at door staring out into hallway.

## 2019-09-11 NOTE — ED Notes (Signed)
Pt screaming and yelling at staff, making multiple threats. Pt manic at this time. Pt trying to unlock bed and move towards the door.

## 2019-09-11 NOTE — ED Notes (Signed)
Pt became verbally and physically aggressive towards staff and security. [employer] at bedside. Medications given once pt placed back into handcuffs and shackles by [employer].

## 2019-09-12 LAB — VALPROIC ACID LEVEL: Valproic Acid Lvl: 31 ug/mL — ABNORMAL LOW (ref 50.0–100.0)

## 2019-09-12 MED ORDER — HYDROXYZINE HCL 25 MG PO TABS
50.0000 mg | ORAL_TABLET | Freq: Once | ORAL | Status: AC
  Administered 2019-09-12: 50 mg via ORAL
  Filled 2019-09-12: qty 2

## 2019-09-12 MED ORDER — ARIPIPRAZOLE 5 MG PO TABS
15.0000 mg | ORAL_TABLET | Freq: Every day | ORAL | Status: DC
  Administered 2019-09-13 – 2019-09-16 (×4): 15 mg via ORAL
  Filled 2019-09-12 (×5): qty 3

## 2019-09-12 NOTE — ED Notes (Signed)
Pt calm and cooperative at this time. Pt requesting to make a phone call. Pt given phone and allowed to make phone call.

## 2019-09-12 NOTE — ED Notes (Signed)
Pt has been out of forensic restraint since 1900  He has been calm, cooperative, conversant  And has thus far abided by his verbal contract with this RN

## 2019-09-12 NOTE — ED Notes (Signed)
Pt reports that melatonin has helped him dose   Request visteril to help him sleep   Orders for Visteril 50mg  po for pt   Pt asks if this med can follow wherever he goes   It is suggested that pt will have an opportunity to speak with physician and they will discuss meds when he is found placement   But that they will also have access to the meds given here

## 2019-09-12 NOTE — ED Notes (Signed)
Patient just finished breakfast and just pacing floor back and forward in room

## 2019-09-12 NOTE — ED Notes (Signed)
TTS in progress 

## 2019-09-12 NOTE — ED Provider Notes (Signed)
Emergency Medicine Observation Re-evaluation Note  Brad Moreno is a 39 y.o. male, seen on rounds today.  Pt initially presented to the ED for complaints of V70.1 Currently, the patient is loud but cooperative.  Physical Exam  BP 128/62 (BP Location: Right Arm)   Pulse 75   Temp 98 F (36.7 C) (Oral)   Resp 18   Ht 5\' 9"  (1.753 m)   Wt 87 kg   SpO2 100%   BMI 28.32 kg/m  Physical Exam  ED Course / MDM  EKG:  Clinical Course as of Sep 11 713  Fri Sep 07, 2019  2247 Labs reviewed.  No significant abnormalities.  Patient is medically cleared   [JK]    Clinical Course User Index [JK] 2248, MD   I have reviewed the labs performed to date as well as medications administered while in observation.  Recent changes in the last 24 hours include aggitation requiring restrains, now off. Plan  Current plan is for bed search for inpatient . Patient is under full IVC at this time.   Linwood Dibbles, MD 09/13/19 [phone]

## 2019-09-12 NOTE — ED Notes (Addendum)
Patient taking shower ok by his nurse Heather,pt is being cooperative at this time,police outside of shower waiting on patient.

## 2019-09-12 NOTE — Progress Notes (Signed)
[facility] Referral form has been re-faxed to [employer] at their request (only part of paperwork was initially received). They will call [phone] with authorization number and fax paperwork to [phone].  Wells Guiles, LCSW, LCAS Disposition CSW [facility] B[facility]/TTS [phone]

## 2019-09-12 NOTE — Progress Notes (Signed)
Patient ID: Brad Moreno, male   DOB: Nov 04, 1980, 39 y.o.   MRN: [id number]  Medication recommendation   Per Dr. Lucianne Muss, patients behaviors showing improvement with Abilify.  She has recommended that Abilify be increased from 10 mg by mouth daily to 15 mg mouth daily. Adjustments have been reflected in MAR. Patient had no recent valproic level so one was ordered.

## 2019-09-12 NOTE — ED Notes (Signed)
Pt given dinner tray. Pt has been calm this afternoon and cooperative.

## 2019-09-12 NOTE — BH Assessment (Signed)
Re-assessment 09/12/2019  Patient reassessed on this day. He displays sypmptoms mania and paranoia. Patient with flight of ideas and tangential speech . Patient started discussing his issues with his parents and feels they have stolen $1600 of his disability income. He mentions multiple times that he wants to slap and "beat they ass". Patient referring to his parents as "sperm and egg donors". When asked if he has any intentions to harm others including his parents he states, "No, Hell No". Patient stating, "I am not a violent person". He continues on discussing his relationship with his spouse and the discord within their relationship.  His desire to travel outside of [place]. His relationship with various family members including his brothers. He spoke on the [employer] and multiple other subjects.   Patient denies SI and AVH's. He did not appear to be responding to internal stimuli. However, he did display signs of paranoia towards others mainly is parents. He reports difficulty sleeping. Appetite is fair.   Patient continues to meet inpatient treatment, per Denzil Magnuson, NP. Disposition LCSW will continue seeking placement.

## 2019-09-13 MED ORDER — HYDROXYZINE HCL 25 MG PO TABS
50.0000 mg | ORAL_TABLET | Freq: Once | ORAL | Status: AC
  Administered 2019-09-13: 50 mg via ORAL
  Filled 2019-09-13: qty 2

## 2019-09-13 NOTE — Progress Notes (Signed)
CSW spoke with admissions staff at [facility]. Pt remains on [facility] wait list.   Disposition will continue to follow.    Wells Guiles, LCSW, LCAS Disposition CSW [facility] BHH/TTS [phone]

## 2019-09-13 NOTE — BH Assessment (Addendum)
Behavioral Health Reassessment.  Patient was seen for re-assessment.  He presented in a much better mood today. Initially. he presented well and organized, however, the longer he talked, the more disorganized he became and his thoughts became very loose and his conversation was making no sense. He stated that he has been diagnosed with bipolar disorder.  It was apparent based on the documentation of his behavior in the ED that he had not been taking his medications.  He stated that he has been taking his Abilify and Depakote in the ED and showing some improvement, but it does not appear like he is at his baseline as of yet.  Therefore, continued inpatient is recommended.

## 2019-09-13 NOTE — ED Notes (Signed)
Pt showered new mask given.

## 2019-09-14 MED ORDER — LORAZEPAM 1 MG PO TABS
1.0000 mg | ORAL_TABLET | Freq: Once | ORAL | Status: AC
  Administered 2019-09-14: 1 mg via ORAL
  Filled 2019-09-14: qty 1

## 2019-09-14 MED ORDER — DIVALPROEX SODIUM 250 MG PO DR TAB
1000.0000 mg | DELAYED_RELEASE_TABLET | Freq: Two times a day (BID) | ORAL | Status: DC
  Administered 2019-09-14 – 2019-09-19 (×11): 1000 mg via ORAL
  Filled 2019-09-14 (×10): qty 4

## 2019-09-14 MED ORDER — LORAZEPAM 1 MG PO TABS: 2.0000 mg | ORAL_TABLET | Freq: Once | ORAL | Status: DC

## 2019-09-14 NOTE — ED Notes (Signed)
Pt washed up in room. Pt was respectful. Pt requested to wrap knee with ace bandage that it was bothering him. Right knee was wrapped with ace bandage. Pt has restraint on left ankle. Pt is now resting calmly in bed watching tv

## 2019-09-14 NOTE — ED Notes (Signed)
Pt knee wrapped with ace wrap per request. Rn also notified pt wants tylenol.

## 2019-09-14 NOTE — ED Notes (Signed)
Pt pacing in room, jumping up and down. Rapid, tangential speech and cursing. EDP notified.

## 2019-09-14 NOTE — ED Provider Notes (Signed)
Emergency Medicine Observation Re-evaluation Note  Brad Moreno is a 39 y.o. male, seen on rounds today.  Pt initially presented to the ED for complaints of V70.1 Currently, the patient is awaiting placement in the ED.  Physical Exam  BP 122/81   Pulse 74   Temp 97.6 F (36.4 C)   Resp 20   Ht 5\' 9"  (1.753 m)   Wt 87 kg   SpO2 100%   BMI 28.32 kg/m  Physical Exam Patient appears somewhat agitated but redirectable.  Even, unlabored respirations.   ED Course / MDM  EKG:  Clinical Course as of Sep 14 730  Fri Sep 07, 2019  2247 Labs reviewed.  No significant abnormalities.  Patient is medically cleared   [JK]    Clinical Course User Index [JK] 2248, MD   I have reviewed the labs performed to date as well as medications administered while in observation.  Recent changes in the last 24 hours include, NA Plan  Current plan is for placement from the ED. Patient is under full IVC at this time.   Linwood Dibbles, MD 09/15/19 [phone]

## 2019-09-14 NOTE — Consult Note (Addendum)
Increased Depakote to 1000mg  BID as depakote level was 31 and ordered depakote level and cbc with diff for Sunday morning

## 2019-09-14 NOTE — ED Notes (Signed)
TTS currently evaluating patient

## 2019-09-14 NOTE — Progress Notes (Signed)
Patient ID: Brad Moreno, male   DOB: 11/29/1980, 39 y.o.   MRN: [id number]. Per previous assessment: Patient presents to the ED indicating he needs to see a mental health person. Patient has a history of schizophrenia and psychosis. Patient feels like his meds are not right. He has been thinking about hitting his mother. He mentioned to the nurses about possibly blowing up his car. He denies feeling suicidal.  This is a follow-up tele-psych re-assessment. Brad Moreno is seen, chart reviewed. He remains at the [facility] ED awaiting inpatient psychiatric placement for mental health stabilization. Today, he presents alert & oriented. However Brad Moreno continues to present with pressured & disorganized speech, jumping from topic to topic tangentially & circumstantially. He reports, "I'm ready to get out of this hell hole. I came to the hospital because my family were getting on my last nerve. I never had a pet growing up. When I was old enough to get a pet, I got a dog. Then, I was Ivc'ed to the hospital. When I got discharged, I came home, found out that my parents has purchased a cat, a girl-cat for that matter. That cat followed me every where. When I had a towel around my waist after a shower, that cat will follow me to the bathroom & to my room looking at me down there, if you know what I mean. At this time, Brad Moreno remains manic & disorganized, in need of inpatient hospitalization. His medications were upgraded recently by Dr. Lucianne Muss with a new lab order for Depakote level checks on Sunday morning.

## 2019-09-15 MED ORDER — DIVALPROEX SODIUM ER 250 MG PO TB24
ORAL_TABLET | ORAL | Status: AC
  Filled 2019-09-15: qty 1

## 2019-09-15 NOTE — BHH Counselor (Signed)
TTS reassessment: Patient is alert and oriented x 4. He is irritable and argumentative with this assessor. He is disorganized. He states "I'm here because I involuntarily committed myself. I wanted to commit my mom so she didn't die." He further states "How the fuck am I supposed to commit someone without no car or ID." He denies SI. He reports he felt HI toward his father in the past. When asked about AVH he states "How the fuck am I supposed to know if I'm seeing things without my prescription eye glasses. I'm blessed and can see things. If I feel a woman in my bed then there is a woman in my bed."   Patient continues to meet in patient criteria.

## 2019-09-16 LAB — CBC WITH DIFFERENTIAL/PLATELET
Abs Immature Granulocytes: 0.01 10*3/uL (ref 0.00–0.07)
Basophils Absolute: 0 10*3/uL (ref 0.0–0.1)
Basophils Relative: 1 %
Eosinophils Absolute: 0.7 10*3/uL — ABNORMAL HIGH (ref 0.0–0.5)
Eosinophils Relative: 11 %
HCT: 46.5 % (ref 39.0–52.0)
Hemoglobin: 14.4 g/dL (ref 13.0–17.0)
Immature Granulocytes: 0 %
Lymphocytes Relative: 42 %
Lymphs Abs: 2.5 10*3/uL (ref 0.7–4.0)
MCH: 27.3 pg (ref 26.0–34.0)
MCHC: 31 g/dL (ref 30.0–36.0)
MCV: 88.2 fL (ref 80.0–100.0)
Monocytes Absolute: 0.4 10*3/uL (ref 0.1–1.0)
Monocytes Relative: 6 %
Neutro Abs: 2.4 10*3/uL (ref 1.7–7.7)
Neutrophils Relative %: 40 %
Platelets: 242 10*3/uL (ref 150–400)
RBC: 5.27 MIL/uL (ref 4.22–5.81)
RDW: 13.5 % (ref 11.5–15.5)
WBC: 5.9 10*3/uL (ref 4.0–10.5)
nRBC: 0 % (ref 0.0–0.2)

## 2019-09-16 LAB — VALPROIC ACID LEVEL: Valproic Acid Lvl: 44 ug/mL — ABNORMAL LOW (ref 50.0–100.0)

## 2019-09-16 NOTE — BHH Counselor (Signed)
Pt is a 39 year old male who remains at the ED due to symptoms consistent with Bipolar I.  Pt was reassessed today -- he was laughing loudly at TV and then computer screen.  Pt spoke loudly and rapidly to author, and he had loose associations.  Pt reported that he has not taken or refuses to take his medication.  He spoke rapidly about conflict with his father, that his father accused Pt of ruining Pt's credit, and that he wants to get on disability.  Pt denied SI and HI.  However, he appears to be in manic state.  Recommend continued inpatient.

## 2019-09-16 NOTE — ED Notes (Signed)
Pt in room, laughing at the TV

## 2019-09-17 MED ORDER — ARIPIPRAZOLE 5 MG PO TABS
10.0000 mg | ORAL_TABLET | Freq: Two times a day (BID) | ORAL | Status: DC
  Administered 2019-09-17 – 2019-09-19 (×5): 10 mg via ORAL
  Filled 2019-09-17 (×4): qty 2

## 2019-09-17 NOTE — ED Notes (Signed)
Pt given breakfast tray

## 2019-09-17 NOTE — Progress Notes (Signed)
CSW contacted admissions at [facility]. Pt remains on their wait list.    Disposition will continue to follow.     Wells Guiles, LCSW, LCAS Disposition CSW [facility] BHH/TTS [phone]

## 2019-09-17 NOTE — ED Notes (Signed)
Pt given lunch tray.
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
## 2019-09-17 NOTE — ED Provider Notes (Signed)
Emergency Medicine Observation Re-evaluation Note  Brad Moreno is a 39 y.o. male, seen on rounds today.  Pt initially presented to the ED for complaints of V70.1 Currently, the patient is awaiting BH placement.  Physical Exam  BP 119/76 (BP Location: Right Arm)   Pulse 67   Temp 97.7 F (36.5 C) (Oral)   Resp 18   Ht 5\' 9"  (1.753 m)   Wt 87 kg   SpO2 100%   BMI 28.32 kg/m  Physical Exam Calm and cooperative.  Even, unlabored respirations.  Ambulatory without difficulty.  Patient is very disorganized and tangential in thought process and speech.   ED Course / MDM  EKG:  Clinical Course as of Sep 17 1035  Fri Sep 07, 2019  2247 Labs reviewed.  No significant abnormalities.  Patient is medically cleared   [JK]    Clinical Course User Index [JK] 2248, MD   I have reviewed the labs performed to date as well as medications administered while in observation.  Recent changes in the last 24 hours include N/A. Plan  Current plan is for placement with BH. Patient apparently on wait list with [facility].   Patient is under full IVC at this time.   [facility], MD 09/19/19 1925

## 2019-09-17 NOTE — ED Notes (Signed)
Pt speaking with TTS 
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
## 2019-09-18 NOTE — ED Notes (Signed)
  Pt requested meds for itching

## 2019-09-19 LAB — HEPATIC FUNCTION PANEL
ALT: 16 U/L (ref 0–44)
AST: 15 U/L (ref 15–41)
Albumin: 3.7 g/dL (ref 3.5–5.0)
Alkaline Phosphatase: 40 U/L (ref 38–126)
Bilirubin, Direct: 0.1 mg/dL (ref 0.0–0.2)
Indirect Bilirubin: 0.4 mg/dL (ref 0.3–0.9)
Total Bilirubin: 0.5 mg/dL (ref 0.3–1.2)
Total Protein: 7 g/dL (ref 6.5–8.1)

## 2019-09-19 LAB — VALPROIC ACID LEVEL: Valproic Acid Lvl: 33 ug/mL — ABNORMAL LOW (ref 50.0–100.0)

## 2019-09-19 MED ORDER — ZIPRASIDONE MESYLATE 20 MG IM SOLR
20.0000 mg | Freq: Once | INTRAMUSCULAR | Status: AC
  Administered 2019-09-19: 20 mg via INTRAMUSCULAR
  Filled 2019-09-19: qty 20

## 2019-09-19 MED ORDER — DIVALPROEX SODIUM 250 MG PO DR TAB
1500.0000 mg | DELAYED_RELEASE_TABLET | Freq: Every day | ORAL | Status: DC
  Administered 2019-09-19 – 2019-09-23 (×5): 1500 mg via ORAL
  Filled 2019-09-19 (×5): qty 6

## 2019-09-19 MED ORDER — OLANZAPINE 5 MG PO TBDP: 10.0000 mg | ORAL_TABLET | Freq: Three times a day (TID) | ORAL | Status: DC | PRN

## 2019-09-19 MED ORDER — LORAZEPAM 1 MG PO TABS: 1.0000 mg | ORAL_TABLET | ORAL | Status: DC | PRN

## 2019-09-19 MED ORDER — HALOPERIDOL 5 MG PO TABS
15.0000 mg | ORAL_TABLET | Freq: Every day | ORAL | Status: DC
  Administered 2019-09-19 – 2019-09-23 (×5): 15 mg via ORAL
  Filled 2019-09-19 (×5): qty 3

## 2019-09-19 MED ORDER — HALOPERIDOL 5 MG PO TABS
10.0000 mg | ORAL_TABLET | Freq: Every morning | ORAL | Status: DC
  Administered 2019-09-20 – 2019-09-24 (×5): 10 mg via ORAL
  Filled 2019-09-19 (×5): qty 2

## 2019-09-19 MED ORDER — DIVALPROEX SODIUM 250 MG PO DR TAB
1000.0000 mg | DELAYED_RELEASE_TABLET | Freq: Every morning | ORAL | Status: DC
  Administered 2019-09-20 – 2019-09-24 (×4): 1000 mg via ORAL
  Filled 2019-09-19 (×4): qty 4

## 2019-09-19 MED ORDER — STERILE WATER FOR INJECTION IJ SOLN
INTRAMUSCULAR | Status: AC
  Filled 2019-09-19: qty 10

## 2019-09-19 MED ORDER — HALOPERIDOL 5 MG PO TABS: 10.0000 mg | ORAL_TABLET | Freq: Every morning | ORAL | Status: DC

## 2019-09-19 MED ORDER — ZIPRASIDONE MESYLATE 20 MG IM SOLR: 20.0000 mg | INTRAMUSCULAR | Status: DC | PRN
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
## 2019-09-19 NOTE — Progress Notes (Signed)
CSW spoke with admissions staff at [facility]. Pt remains on their wait list.    Disposition will continue to follow.    Wells Guiles, LCSW, LCAS Disposition CSW [facility] BHH/TTS [phone]
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
## 2019-09-19 NOTE — ED Notes (Signed)
PT raising his voice and cussing requesting double portions and he is still hungry. Kitchen was called and they will bring a sandwich bag up for patient. Double portions added to diet order.

## 2019-09-19 NOTE — ED Notes (Signed)
Pt. Medicated and sleep laying on his stomach unable to complete ekg.

## 2019-09-19 NOTE — ED Notes (Signed)
EKG done and given to EDP. PT given graham crackers and ginger ale to drink at this time.
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
## 2019-09-19 NOTE — ED Notes (Signed)
Pt got loud and cussing over not receiving double portions for lunch. Pt stated that he would not take any medications or anything else until he got his double portions. Pt is complaining about having to speak with TTS multiple times that he was ready to be shipped out of here to [facility].  Nurse was notified of pts behavior.

## 2019-09-20 NOTE — BH Assessment (Signed)
CSW spoke with Vaughan Basta in admissions at [facility]. Per Vaughan Basta, the patient remains on the wait list for admission.   TTS will complete a re-assessment on the patient today.   TTS/Disposition will continue to follow for potential placement.    Baldo Daub, MSW, LCSW Clinical Social Worker [facility]
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
## 2019-09-22 DIAGNOSIS — F250 Schizoaffective disorder, bipolar type: Secondary | ICD-10-CM

## 2019-09-22 LAB — VALPROIC ACID LEVEL: Valproic Acid Lvl: 119 ug/mL — ABNORMAL HIGH (ref 50.0–100.0)

## 2019-09-22 MED ORDER — LORAZEPAM 2 MG/ML IJ SOLN: 2.0000 mg | Freq: Once | INTRAMUSCULAR | Status: DC | PRN

## 2019-09-22 MED ORDER — BENZTROPINE MESYLATE 1 MG PO TABS
1.0000 mg | ORAL_TABLET | Freq: Two times a day (BID) | ORAL | Status: DC
  Administered 2019-09-22 – 2019-09-24 (×5): 1 mg via ORAL
  Filled 2019-09-22 (×5): qty 1

## 2019-09-22 MED ORDER — ZIPRASIDONE MESYLATE 20 MG IM SOLR: 20.0000 mg | Freq: Once | INTRAMUSCULAR | Status: DC | PRN

## 2019-09-22 MED ORDER — DIPHENHYDRAMINE HCL 50 MG/ML IJ SOLN: 50.0000 mg | Freq: Once | INTRAMUSCULAR | Status: DC | PRN

## 2019-09-22 MED ORDER — RISPERIDONE 1 MG PO TABS
1.0000 mg | ORAL_TABLET | Freq: Two times a day (BID) | ORAL | Status: DC
  Administered 2019-09-22 – 2019-09-24 (×5): 1 mg via ORAL
  Filled 2019-09-22 (×5): qty 1

## 2019-09-22 NOTE — ED Notes (Signed)
Safety breakfast tray given to patient. Finger foods only on tray. No utensils

## 2019-09-22 NOTE — ED Notes (Signed)
Pt has been calm and cooperative this shift. 
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
## 2019-09-23 NOTE — BH Assessment (Signed)
Behavioral Health Reassessment Note:    Patient was seen for re-assessment.  He presented with a very pleasant affect.  He was smiling and carrying on a conversation.  He states that he would like to go to a hospital where they have a gym and work out facilities.  Informed him that he has been referred to [facility].  He states, "I have not been there since 2007."  Patient currently denies SI/HI/Psychosis.  TTS to inform provider how well patient appears to be doing.  However, based on the events that brought him to the hospital, inpatient treatment continues to be recommended.

## 2019-09-24 NOTE — Progress Notes (Signed)
CSW spoke with pt's father/legal guardian and notified him that pt had been psychiatrically cleared. He will be picking pt up from [facility] at 1230pm.    Wells Guiles, LCSW, LCAS Disposition CSW [facility]/TTS [phone]

## 2019-09-24 NOTE — Progress Notes (Signed)
CSW spoke with pt's father/legal guardian, Bradyn Soward [phone]). We discussed the progress pt has made while at [facility], particularly the decrease in aggressive behaviors. Pt's guardian agreed to take pt home from [facility] ED if psychiatrically cleared.    Wells Guiles, LCSW, LCAS Disposition CSW [facility] [phone]

## 2019-09-24 NOTE — Discharge Instructions (Signed)
Follow-up with a behavioral health as recommended by psychiatry.

## 2019-09-24 NOTE — ED Notes (Signed)
TTS in progress 
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
## 2019-09-24 NOTE — ED Notes (Addendum)
Per [facility], pt is psych cleared and pt legal guardian,pt father, is coming to pick up pt. [facility] reported pt needs outpatient psychiatric follow-up for med management. [facility] reported would fax over information but to reach out to ED SW and speak with pt father regarding importance of follow-up. EDP aware.  Contacted SW regarding outpatient follow-up. CSW reported wouldn't be able to make referral until after lunch. Name of facility written down and will be passed along to pt/pt father and made aware that facility will call pt for follow up.

## 2019-09-24 NOTE — ED Provider Notes (Signed)
Patient has been evaluated multiple times by behavioral health who feels as though patient is now suitable for discharge.  His father is apparently coming to pick him up.   Brad Lyons, MD 09/24/19 1214

## 2019-09-24 NOTE — Clinical Social Work Note (Signed)
Transition of Care [facility]) - Emergency Department Mini Assessment  Patient Details  Name: Brad Moreno MRN: [id number] Date of Birth: 23-Apr-1980  Transition of Care [facility]) CM/SW Contact:    Ewing Schlein, LCSW Phone Number: 09/24/2019, 1:28 PM  Clinical Narrative: CSW notified by RNVictorino Dike, that [facility] has psych cleared the patient as he is considered stable enough to return home and requested assistance with an OP BH referral. CSW called [facility] and spoke with Tia. Referral accepted. [facility] to follow up with patient and his father, Edilson Vital. TOC signing off.  ED Mini Assessment: What brought you to the Emergency Department? : IVC'd Barriers to Discharge: ED Barriers Resolved Barrier interventions: Patient referral to [facility] for OP BH treatment Means of departure: Car Interventions which prevented an admission or readmission: Other (must enter comment) (Referral for outpatient BH treatment)  Patient Contact and Communications Key Contact 1: [facility] Spoke with: Tia Contact Date: 09/24/19,  Contact time: 1322 Contact Phone Number: [phone] Call outcome: Referral was made Patient states their goals for this hospitalization and ongoing recovery are:: Receive OP BH treatment  Admission diagnosis:  Medical Clearance  Patient Active Problem List   Diagnosis Date Noted  . Current smoker 03/29/2018  . Class 1 obesity with body mass index (BMI) of 33.0 to 33.9 in adult 03/29/2018  . Alcohol use 02/17/2017  . Schizoaffective disorder, bipolar type (HCC) 06/09/2010   PCP:  Donita Brooks, MD Pharmacy:   [facility] [phone] - [place], [place] - [address] [place] [address] Phone: [phone] Fax: [phone]

## 2019-09-24 NOTE — ED Notes (Signed)
Pt given belongings from EMS locker and security safe prior to discharge.

## 2019-09-28 ENCOUNTER — Other Ambulatory Visit: Payer: Self-pay

## 2019-09-28 ENCOUNTER — Emergency Department (HOSPITAL_COMMUNITY)
Admission: EM | Admit: 2019-09-28 | Discharge: 2019-09-29 | Disposition: A | Discharge status: Home / Self Care | Payer: Medicaid Other | Attending: Emergency Medicine | Admitting: Emergency Medicine

## 2019-09-28 DIAGNOSIS — R454 Irritability and anger: Secondary | ICD-10-CM | POA: Diagnosis not present

## 2019-09-28 DIAGNOSIS — Z87891 Personal history of nicotine dependence: Secondary | ICD-10-CM | POA: Insufficient documentation

## 2019-09-28 DIAGNOSIS — F4320 Adjustment disorder, unspecified: Secondary | ICD-10-CM | POA: Insufficient documentation

## 2019-09-28 DIAGNOSIS — F1210 Cannabis abuse, uncomplicated: Secondary | ICD-10-CM | POA: Diagnosis not present

## 2019-09-28 DIAGNOSIS — Y907 Blood alcohol level of 200-239 mg/100 ml: Secondary | ICD-10-CM | POA: Diagnosis not present

## 2019-09-28 DIAGNOSIS — Z01818 Encounter for other preprocedural examination: Secondary | ICD-10-CM | POA: Diagnosis present

## 2019-09-28 LAB — CBC
HCT: 43.8 % (ref 39.0–52.0)
Hemoglobin: 13.7 g/dL (ref 13.0–17.0)
MCH: 27.3 pg (ref 26.0–34.0)
MCHC: 31.3 g/dL (ref 30.0–36.0)
MCV: 87.3 fL (ref 80.0–100.0)
Platelets: 155 10*3/uL (ref 150–400)
RBC: 5.02 MIL/uL (ref 4.22–5.81)
RDW: 13.5 % (ref 11.5–15.5)
WBC: 8.6 10*3/uL (ref 4.0–10.5)
nRBC: 0 % (ref 0.0–0.2)

## 2019-09-28 LAB — COMPREHENSIVE METABOLIC PANEL
ALT: 21 U/L (ref 0–44)
AST: 24 U/L (ref 15–41)
Albumin: 3.9 g/dL (ref 3.5–5.0)
Alkaline Phosphatase: 59 U/L (ref 38–126)
Anion gap: 12 (ref 5–15)
BUN: 29 mg/dL — ABNORMAL HIGH (ref 6–20)
CO2: 24 mmol/L (ref 22–32)
Calcium: 8.8 mg/dL — ABNORMAL LOW (ref 8.9–10.3)
Chloride: 103 mmol/L (ref 98–111)
Creatinine, Ser: 1.11 mg/dL (ref 0.61–1.24)
GFR calc Af Amer: >60 mL/min (ref >60)
GFR calc non Af Amer: >60 mL/min (ref >60)
Glucose, Bld: 90 mg/dL (ref 70–99)
Potassium: 3.9 mmol/L (ref 3.5–5.1)
Sodium: 139 mmol/L (ref 135–145)
Total Bilirubin: 0.4 mg/dL (ref 0.3–1.2)
Total Protein: 7 g/dL (ref 6.5–8.1)

## 2019-09-28 LAB — ETHANOL: Alcohol, Ethyl (B): 51 mg/dL — ABNORMAL HIGH (ref <10)

## 2019-09-28 LAB — SALICYLATE LEVEL: Salicylate Lvl: <7 mg/dL — ABNORMAL LOW (ref 7.0–30.0)

## 2019-09-28 LAB — ACETAMINOPHEN LEVEL: Acetaminophen (Tylenol), Serum: <10 ug/mL — ABNORMAL LOW (ref 10–30)

## 2019-09-28 NOTE — ED Triage Notes (Signed)
Pt states he was discharged here a week ago after medical clearance.  Pt states he went to his parents home, but has not been able to get along with his parents and has no place to go.  Pt denies SI/HI, states he also needs his medications ordered.  Pt is calm and cooperative.

## 2019-09-29 ENCOUNTER — Encounter (HOSPITAL_COMMUNITY): Payer: Self-pay | Admitting: Registered Nurse

## 2019-09-29 DIAGNOSIS — R454 Irritability and anger: Secondary | ICD-10-CM | POA: Diagnosis not present

## 2019-09-29 LAB — RAPID URINE DRUG SCREEN, HOSP PERFORMED
Amphetamines: NOT DETECTED
Barbiturates: NOT DETECTED
Benzodiazepines: NOT DETECTED
Cocaine: NOT DETECTED
Opiates: NOT DETECTED
Tetrahydrocannabinol: NOT DETECTED
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
## 2019-09-29 NOTE — ED Notes (Signed)
Per BH, Brad Moreno patient is psych cleared, EDP made aware.
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
## 2019-09-29 NOTE — Discharge Instructions (Signed)
[facility]:    Why:  For outpatient psychiatric services [address] [place], [place] [address]  [phone]   [address]  [place], [place] [address]  [phone]  If you don't hear anything from [facility] by Monday 09/30/19 please call to arrange appointment

## 2019-09-29 NOTE — TOC Transition Note (Signed)
Transition of Care [facility]) - CM/SW Discharge Note   Patient Details  Name: Brad Moreno MRN: [id number] Date of Birth: 07/30/1980  [facility] CM/SW Contact:  Barry Brunner, LCSW Phone Number: 09/29/2019, 2:04 PM   Clinical Narrative:    CSW called to patient's legal guardian to confirm that he was aware of patient's discharge and agreeable to take patient. Patient's father confirmed that he was aware of patient's discharge and expecting the patient to arrive to the family home. CSW verified that patient was psych cleared by [facility]. TOC signing off.          Readmission Risk Interventions No flowsheet data found.

## 2019-09-29 NOTE — ED Notes (Signed)
Bagged lunch given
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
## 2019-11-10 ENCOUNTER — Emergency Department (HOSPITAL_COMMUNITY)
Admission: EM | Admit: 2019-11-10 | Discharge: 2019-11-11 | Disposition: A | Discharge status: Home / Self Care | Payer: Medicaid Other | Attending: Emergency Medicine | Admitting: Emergency Medicine

## 2019-11-10 ENCOUNTER — Other Ambulatory Visit: Payer: Self-pay

## 2019-11-10 ENCOUNTER — Encounter (HOSPITAL_COMMUNITY): Payer: Self-pay | Admitting: *Deleted

## 2019-11-10 DIAGNOSIS — Z79899 Other long term (current) drug therapy: Secondary | ICD-10-CM | POA: Diagnosis not present

## 2019-11-10 DIAGNOSIS — Z87891 Personal history of nicotine dependence: Secondary | ICD-10-CM | POA: Insufficient documentation

## 2019-11-10 DIAGNOSIS — R4589 Other symptoms and signs involving emotional state: Secondary | ICD-10-CM | POA: Diagnosis present

## 2019-11-10 DIAGNOSIS — F29 Unspecified psychosis not due to a substance or known physiological condition: Secondary | ICD-10-CM | POA: Insufficient documentation

## 2019-11-10 DIAGNOSIS — F3110 Bipolar disorder, current episode manic without psychotic features, unspecified: Secondary | ICD-10-CM | POA: Diagnosis not present

## 2019-11-10 LAB — RAPID URINE DRUG SCREEN, HOSP PERFORMED
Amphetamines: NOT DETECTED
Barbiturates: NOT DETECTED
Benzodiazepines: NOT DETECTED
Cocaine: NOT DETECTED
Opiates: NOT DETECTED
Tetrahydrocannabinol: POSITIVE — AB

## 2019-11-10 LAB — BASIC METABOLIC PANEL
Anion gap: 9 (ref 5–15)
BUN: 12 mg/dL (ref 6–20)
CO2: 25 mmol/L (ref 22–32)
Calcium: 9 mg/dL (ref 8.9–10.3)
Chloride: 104 mmol/L (ref 98–111)
Creatinine, Ser: 1.05 mg/dL (ref 0.61–1.24)
GFR calc Af Amer: >60 mL/min (ref >60)
GFR calc non Af Amer: >60 mL/min (ref >60)
Glucose, Bld: 106 mg/dL — ABNORMAL HIGH (ref 70–99)
Potassium: 4 mmol/L (ref 3.5–5.1)
Sodium: 138 mmol/L (ref 135–145)

## 2019-11-10 LAB — CBC
HCT: 46.3 % (ref 39.0–52.0)
Hemoglobin: 15 g/dL (ref 13.0–17.0)
MCH: 26.9 pg (ref 26.0–34.0)
MCHC: 32.4 g/dL (ref 30.0–36.0)
MCV: 83.1 fL (ref 80.0–100.0)
Platelets: 264 10*3/uL (ref 150–400)
RBC: 5.57 MIL/uL (ref 4.22–5.81)
RDW: 13.6 % (ref 11.5–15.5)
WBC: 8.4 10*3/uL (ref 4.0–10.5)
nRBC: 0 % (ref 0.0–0.2)

## 2019-11-10 LAB — VALPROIC ACID LEVEL: Valproic Acid Lvl: 71 ug/mL (ref 50.0–100.0)

## 2019-11-10 LAB — ETHANOL: Alcohol, Ethyl (B): <10 mg/dL (ref <10)

## 2019-11-10 LAB — SALICYLATE LEVEL: Salicylate Lvl: <7 mg/dL — ABNORMAL LOW (ref 7.0–30.0)

## 2019-11-10 LAB — ACETAMINOPHEN LEVEL: Acetaminophen (Tylenol), Serum: <10 ug/mL — ABNORMAL LOW (ref 10–30)

## 2019-11-10 NOTE — ED Notes (Addendum)
Pt changed into hospital provided scrubs, pts personal belonging locked in the locker. Pt wanded by security at this time.

## 2019-11-10 NOTE — ED Triage Notes (Signed)
Pt with feelings of worthless and depressed.  Pt states he is homeless but stays at a local hotel.  Pt has no plan SI or HI.

## 2019-11-10 NOTE — ED Provider Notes (Signed)
[facility] EMERGENCY DEPARTMENT Provider Note   CSN: [id number] Arrival date & time: 11/10/19  2045     History Chief Complaint  Patient presents with   V70.1    Brad Moreno is a 39 y.o. male.  HPI   This patient is a 39 year old male, history of schizophrenia as well as bipolar disorder.  He has had multiple visits to the emergency department in the past because of decompensated mental health crises.  Most recently he was in the emergency department in August during which time he was agitated and had been throwing rocks at cars, he was committed by his father.  Ultimately the patient did well and was not thought to need admission.  Since that time he moved out of his father's house about a week ago according to his report and has been living in a hotel but recently because he called the police he states that he had to turn his keys in at the hotel and no longer has a place to stay making him homeless.  When asked why he called the police he states because people in the city are out to humiliate me, he has ideas of persecution, he states that he cannot live with his father because his father hates him and his mother is selfish and stingy.  He states that he has been taking his medications although he cannot tell me the names of them and thinks that Depakote does not work and that it is the wrong medicine for him so he is not taking that medicine anymore.  He denies any thoughts of self-harm, he states he wants to be involuntarily committed so that he can find a place to stay because he is more depressed and feeling very down on himself.  He has not had anything to eat today because he has been feeling down.  The patient denies any physical complaints.  I reviewed the medical record including the prior behavioral health notes  Past Medical History:  Diagnosis Date   Bipolar 1 disorder (HCC)    Depression 06/2010   Psychosis (HCC) 03/23/2017   Schizophrenia [facility])     Patient  Active Problem List   Diagnosis Date Noted   Current smoker 03/29/2018   Class 1 obesity with body mass index (BMI) of 33.0 to 33.9 in adult 03/29/2018   Alcohol use 02/17/2017   Schizoaffective disorder, bipolar type (HCC) 06/09/2010    Past Surgical History:  Procedure Laterality Date   WISDOM TOOTH EXTRACTION         Family History  Problem Relation Age of Onset   Depression Mother    Diabetes Mother    Cancer Paternal Grandfather 6       Colon Cancer   Alcohol abuse Brother        Was Heavy drinker and smoker--has quit   Bipolar disorder Maternal Uncle     Social History   Tobacco Use   Smoking status: Former Smoker    Packs/day: 0.25    Years: 1.00    Pack years: 0.25    Quit date: 02/01/2019    Years since quitting: 0.7   Smokeless tobacco: Never Used  Vaping Use   Vaping Use: Never used  Substance Use Topics   Alcohol use: Yes    Alcohol/week: 3.0 standard drinks    Types: 3 Cans of beer per week    Comment: maybe 6 on weekends only   Drug use: Not Currently    Types: Marijuana  Comment: marijuana last used 06/16/17    Home Medications Prior to Admission medications   Medication Sig Start Date End Date Taking? Authorizing Provider  acetaminophen (TYLENOL) 500 MG tablet Take 500 mg by mouth every 6 (six) hours as needed for mild pain or moderate pain.    [provider]  ARIPiprazole (ABILIFY) 10 MG tablet Take 1 tablet (10 mg total) by mouth daily. 07/30/19   Donita Brooks, MD  ibuprofen (ADVIL) 200 MG tablet Take 200 mg by mouth every 6 (six) hours as needed.    [provider]    Allergies    Amoxicillin and Pork-derived products  Review of Systems   Review of Systems  All other systems reviewed and are negative.   Physical Exam Updated Vital Signs BP 131/88    Pulse 99    Temp 98.6 F (37 C)    Resp 15    SpO2 100%   Physical Exam Vitals and nursing note reviewed.  Constitutional:      General: He  is not in acute distress.    Appearance: He is well-developed.  HENT:     Head: Normocephalic and atraumatic.     Mouth/Throat:     Pharynx: No oropharyngeal exudate.  Eyes:     General: No scleral icterus.       Right eye: No discharge.        Left eye: No discharge.     Conjunctiva/sclera: Conjunctivae normal.     Pupils: Pupils are equal, round, and reactive to light.  Neck:     Thyroid: No thyromegaly.     Vascular: No JVD.  Cardiovascular:     Rate and Rhythm: Normal rate and regular rhythm.     Heart sounds: Normal heart sounds. No murmur heard.  No friction rub. No gallop.   Pulmonary:     Effort: Pulmonary effort is normal. No respiratory distress.     Breath sounds: Normal breath sounds. No wheezing or rales.  Abdominal:     General: Bowel sounds are normal. There is no distension.     Palpations: Abdomen is soft. There is no mass.     Tenderness: There is no abdominal tenderness.  Musculoskeletal:        General: No tenderness. Normal range of motion.     Cervical back: Normal range of motion and neck supple.  Lymphadenopathy:     Cervical: No cervical adenopathy.  Skin:    General: Skin is warm and dry.     Findings: No erythema or rash.  Neurological:     Mental Status: He is alert.     Coordination: Coordination normal.  Psychiatric:     Comments: The patient's affect is very calm, he is cooperative, he speaks in quiet tones and answers my questions.  He does not appear agitated or violent, he is not using any cursing with his speech or threatening others or himself.  He does not appear to be responding to internal stimuli though he does have some delusional thoughts of paranoia and persecutory thoughts     ED Results / Procedures / Treatments   Labs (all labs ordered are listed, but only abnormal results are displayed) Labs Reviewed  BASIC METABOLIC PANEL - Abnormal; Notable for the following components:      Result Value   Glucose, Bld 106 (*)    All  other components within normal limits  RAPID URINE DRUG SCREEN, HOSP PERFORMED - Abnormal; Notable for the following components:  Tetrahydrocannabinol POSITIVE (*)    All other components within normal limits  ACETAMINOPHEN LEVEL - Abnormal; Notable for the following components:   Acetaminophen (Tylenol), Serum <10 (*)    All other components within normal limits  SALICYLATE LEVEL - Abnormal; Notable for the following components:   Salicylate Lvl <7.0 (*)    All other components within normal limits  CBC  ETHANOL  VALPROIC ACID LEVEL    EKG None  Radiology No results found.  Procedures Procedures (including critical care time)  Medications Ordered in ED Medications - No data to display  ED Course  I have reviewed the triage vital signs and the nursing notes.  Pertinent labs & imaging results that were available during my care of the patient were reviewed by me and considered in my medical decision making (see chart for details).    MDM Rules/Calculators/A&P                          This patient appears to have some change in his home status where he is having some increasing persecutory thoughts and delusions, he denies being a danger to himself and I do not think he needs to be involuntarily committed however I will consult with psychiatry regarding possible interventions and placement.  I have reviewed the patient's medical record, it appears that he is supposed to be taking Abilify, I do not see Depakote on his list.  We will check for coingestants levels, alcohol, labs, Depakote level.  His vital signs are unremarkable  I discussed the care with the behavioral health team, they would like the patient to be observed in the emergency department overnight.  Final Clinical Impression(s) / ED Diagnoses Final diagnoses:  Psychosis, unspecified psychosis type [facility])    Rx / DC Orders ED Discharge Orders    None       Eber Hong, MD 11/10/19 2335
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
## 2019-11-11 DIAGNOSIS — F3110 Bipolar disorder, current episode manic without psychotic features, unspecified: Secondary | ICD-10-CM | POA: Diagnosis not present

## 2019-11-11 NOTE — Consult Note (Signed)
Telepsych Consultation   Reason for Consult:  Depression Referring Physician:  EPD Location of Patient: [facility] Provider[facility]  Patient Identification: Brad Moreno MRN:  [id number] Principal Diagnosis: <principal problem not specified> Diagnosis:  Active Problems:   * No active hospital problems. *   Total Time spent with patient: 15 minutes  Subjective:   Brad Moreno is a 39 y.o. male was seen and evaluated via teleassessment.  Patient denied suicidal or homicidal ideations.  Denies auditory or visual hallucinations.  States " I am  very depressed and my dad gets on my nerves."  Brad Moreno states I need to check myself and because at all having housing I am homeless.  Patient reports his father is the payee and is unwilling to provide him with access to any money.  He reports he has been residing in a hotel.  He reports he does not feel as if his Abilify is helping.  States he has an [facility] to [facility] [place]  [facility].  States previous inpatient admission at [facility].  NP [facility]  NP attempted to follow-up with patient's father [name] at [phone] no answer.  CSW to provide additional outpatient resources for housing.  Case staffed with attending psychiatrist Lucianne MussKumar.  Support, reassurance and encouragement was provided.  HPI:  Per admission assessment note: Brad Moreno is an 39 y.o. single male who presents unaccompanied to [facility] ED reporting symptoms of depression. Pt has a documented diagnosis of schizophrenia and says he believes his diagnosis should be depression or bipolar disorder. He states he feels "worthless" and describes his mood as "very depressed." Pt acknowledges symptoms including crying spells, social withdrawal, loss of interest in usual pleasures, fatigue, irritability, decreased concentration, decreased sleep, and feelings of hopelessness. He says he  has not eaten today because he has no food. He denies current suicidal ideation or history of suicide attempts. Pt denies any history of intentional self-injurious behaviors. Pt denies current homicidal ideation or history of violence. He reports a history of auditory hallucinations but denies current hallucinations. Pt reports he uses alcohol, marijuana and cocaine but denies any use in 48 hours (see below).  Past Psychiatric History:   Risk to Self: Suicidal Ideation: No Suicidal Intent: No Is patient at risk for suicide?: No Suicidal Plan?: No Access to Means: No What has been your use of drugs/alcohol within the last 12 months?: Pt reports recently using alcohol, cocaine and marijuana How many times?: 0 Other Self Harm Risks: None Triggers for Past Attempts: None known Intentional Self Injurious Behavior: None Risk to Others: Homicidal Ideation: No Thoughts of Harm to Others: No Comment - Thoughts of Harm to Others: Pt denies Current Homicidal Intent: No Current Homicidal Plan: No Describe Current Homicidal Plan: None Access to Homicidal Means: No Identified Victim: None History of harm to others?: No Assessment of Violence: None Noted Violent Behavior Description: Pt denies history of violence Does patient have access to weapons?: No Criminal Charges Pending?: Yes Describe Pending Criminal Charges: Driving with licensed revoked Does patient have a court date: Yes Court Date: 11/20/19 Prior Inpatient Therapy: Prior Inpatient Therapy: Yes Prior Therapy Dates: 08/2019, multiple admits [facility] Reason for Treatment: Schizophrenia Prior Outpatient Therapy: Prior Outpatient Therapy: Yes Prior Therapy Dates: Current Prior Therapy Facilty/Provider(s): [facility] Reason for Treatment: Schizophrenia Does patient have an ACCT team?: No Does patient have Intensive In-House Services?  : No Does patient have  [facility] services? : No Does patient  have [facility] services?: No  Past Medical History:  Past Medical History:  Diagnosis Date  . Bipolar 1 disorder (HCC)   . Depression 06/2010  . Psychosis (HCC) 03/23/2017  . Schizophrenia [facility])     Past Surgical History:  Procedure Laterality Date  . WISDOM TOOTH EXTRACTION     Family History:  Family History  Problem Relation Age of Onset  . Depression Mother   . Diabetes Mother   . Cancer Paternal Grandfather 37       Colon Cancer  . Alcohol abuse Brother        Was Heavy drinker and smoker--has quit  . Bipolar disorder Maternal Uncle    Family Psychiatric  History:  Social History:  Social History   Substance and Sexual Activity  Alcohol Use Yes  . Alcohol/week: 3.0 standard drinks  . Types: 3 Cans of beer per week   Comment: maybe 6 on weekends only     Social History   Substance and Sexual Activity  Drug Use Not Currently  . Types: Marijuana   Comment: marijuana last used 06/16/17    Social History   Socioeconomic History  . Marital status: Legally Separated    Spouse name: Not on file  . Number of children: Not on file  . Years of education: Not on file  . Highest education level: Not on file  Occupational History    Employer: [employer]  Tobacco Use  . Smoking status: Former Smoker    Packs/day: 0.25    Years: 1.00    Pack years: 0.25    Quit date: 02/01/2019    Years since quitting: 0.7  . Smokeless tobacco: Never Used  Vaping Use  . Vaping Use: Never used  Substance and Sexual Activity  . Alcohol use: Yes    Alcohol/week: 3.0 standard drinks    Types: 3 Cans of beer per week    Comment: maybe 6 on weekends only  . Drug use: Not Currently    Types: Marijuana    Comment: marijuana last used 06/16/17  . Sexual activity: Not Currently  Other Topics Concern  . Not on file  Social History Narrative   80m, 51f, 20f - has daughters weekends   Works weekdays has kids on weekends   Lives alone   Social Determinants of Health   Financial Resource Strain:    . Difficulty of Paying Living Expenses: Not on file  Food Insecurity:   . Worried About Programme researcher, broadcasting/film/video in the Last Year: Not on file  . Ran Out of Food in the Last Year: Not on file  Transportation Needs:   . Lack of Transportation (Medical): Not on file  . Lack of Transportation (Non-Medical): Not on file  Physical Activity:   . Days of Exercise per Week: Not on file  . Minutes of Exercise per Session: Not on file  Stress:   . Feeling of Stress : Not on file  Social Connections:   . Frequency of Communication with Friends and Family: Not on file  . Frequency of Social Gatherings with Friends and Family: Not on file  . Attends Religious Services: Not on file  . Active Member of Clubs or Organizations: Not on file  . Attends Banker Meetings: Not on file  . Marital Status: Not on file   Additional Social History:    Allergies:   Allergies  Allergen Reactions  . Amoxicillin Other (See Comments)  Unknown reaction when he was young  . Pork-Derived Products Other (See Comments)    UNKNOWN    Labs:  Results for orders placed or performed during the hospital encounter of 11/10/19 (from the past 48 hour(s))  Rapid urine drug screen (hospital performed)     Status: Abnormal   Collection Time: 11/10/19  9:09 PM  Result Value Ref Range   Opiates NONE DETECTED NONE DETECTED   Cocaine NONE DETECTED NONE DETECTED   Benzodiazepines NONE DETECTED NONE DETECTED   Amphetamines NONE DETECTED NONE DETECTED   Tetrahydrocannabinol POSITIVE (A) NONE DETECTED   Barbiturates NONE DETECTED NONE DETECTED    Comment: (NOTE) DRUG SCREEN FOR MEDICAL PURPOSES ONLY.  IF CONFIRMATION IS NEEDED FOR ANY PURPOSE, NOTIFY LAB WITHIN 5 DAYS.  LOWEST DETECTABLE LIMITS FOR URINE DRUG SCREEN Drug Class                     Cutoff (ng/mL) Amphetamine and metabolites    1000 Barbiturate and metabolites    200 Benzodiazepine                 200 Tricyclics and metabolites      300 Opiates and metabolites        300 Cocaine and metabolites        300 THC                            50 Performed at [facility], [address] [place], [place] [address]   CBC     Status: None   Collection Time: 11/10/19  9:13 PM  Result Value Ref Range   WBC 8.4 4.0 - 10.5 K/uL   RBC 5.57 4.22 - 5.81 MIL/uL   Hemoglobin 15.0 13.0 - 17.0 g/dL   HCT 83.1 39 - 52 %   MCV 83.1 80.0 - 100.0 fL   MCH 26.9 26.0 - 34.0 pg   MCHC 32.4 30.0 - 36.0 g/dL   RDW 51.7 61.6 - 07.3 %   Platelets 264 150 - 400 K/uL   nRBC 0.0 0.0 - 0.2 %    Comment: Performed at [facility], [address], [place], [place] [address]  Basic metabolic panel     Status: Abnormal   Collection Time: 11/10/19  9:13 PM  Result Value Ref Range   Sodium 138 135 - 145 mmol/L   Potassium 4.0 3.5 - 5.1 mmol/L   Chloride 104 98 - 111 mmol/L   CO2 25 22 - 32 mmol/L   Glucose, Bld 106 (H) 70 - 99 mg/dL    Comment: Glucose reference range applies only to samples taken after fasting for at least 8 hours.   BUN 12 6 - 20 mg/dL   Creatinine, Ser 6.94 0.61 - 1.24 mg/dL   Calcium 9.0 8.9 - 85.4 mg/dL   GFR calc non Af Amer >60 >60 mL/min   GFR calc Af Amer >60 >60 mL/min   Anion gap 9 5 - 15    Comment: Performed at [facility], [address] [place], [place] [address]  Valproic acid level     Status: None   Collection Time: 11/10/19  9:13 PM  Result Value Ref Range   Valproic Acid Lvl 71 50.0 - 100.0 ug/mL    Comment: Performed at [facility], [address], [place], [place] [address]  Acetaminophen level     Status: Abnormal   Collection Time: 11/10/19  9:13 PM  Result Value Ref Range   Acetaminophen (Tylenol), Serum <10 (L) 10 - 30 ug/mL    Comment: (NOTE) Therapeutic concentrations vary significantly. A range of 10-30 ug/mL  may be an effective concentration for many patients. However, some  are best treated at concentrations outside of this range. Acetaminophen concentrations >150 ug/mL at 4 hours after  ingestion  and >50 ug/mL at 12 hours after ingestion are often associated with  toxic reactions.  Performed at [facility], [address] [place], [place] [address]   Salicylate level     Status: Abnormal   Collection Time: 11/10/19  9:13 PM  Result Value Ref Range   Salicylate Lvl <7.0 (L) 7.0 - 30.0 mg/dL    Comment: Performed at [facility], [address], [place], [place] [address]  Ethanol     Status: None   Collection Time: 11/10/19  9:14 PM  Result Value Ref Range   Alcohol, Ethyl (B) <10 <10 mg/dL    Comment: (NOTE) Lowest detectable limit for serum alcohol is 10 mg/dL.  For medical purposes only. Performed at [facility], [address], [place], [place] [address]     Medications:  No current facility-administered medications for this encounter.   Current Outpatient Medications  Medication Sig Dispense Refill  . ARIPiprazole (ABILIFY) 10 MG tablet Take 1 tablet (10 mg total) by mouth daily. 30 tablet 1  . divalproex (DEPAKOTE) 250 MG DR tablet Take 250 mg by mouth 2 (two) times daily.      Musculoskeletal:  Psychiatric Specialty Exam: Physical Exam Vitals reviewed.  Neurological:     Mental Status: He is alert.     Review of Systems  Psychiatric/Behavioral: Negative for suicidal ideas. The patient is nervous/anxious.   All other systems reviewed and are negative.   Blood pressure 130/86, pulse 85, temperature 98.6 F (37 C), resp. rate 14, SpO2 100 %.There is no height or weight on file to calculate BMI.  General Appearance: Casual  Eye Contact:  Good  Speech:  Clear and Coherent  Volume:  Normal  Mood:  Anxious and Depressed  Affect:  Appropriate  Thought Process:  Coherent  Orientation:  Full (Time, Place, and Person)  Thought Content:  Logical  Suicidal Thoughts:  No  Homicidal Thoughts:  No  Memory:  Immediate;   Fair Recent;   Fair  Judgement:  Fair  Insight:  Fair  Psychomotor Activity:  Normal  Concentration:  Concentration: Fair   Recall:  [employer] of Knowledge:  Fair  Language:  Fair  Akathisia:  No  Handed:  Right  AIMS (if indicated):     Assets:  Communication Skills Desire for Improvement Resilience Social Support  ADL's:  Intact  Cognition:  WNL  Sleep:      NP spoke to EDP Zammit regarding discharge disposition recommendation CSW to follow-up with housing and outpatient resources.  Disposition: No evidence of imminent risk to self or others at present.   Patient does not meet criteria for psychiatric inpatient admission. Supportive therapy provided about ongoing stressors. Refer to IOP. Discussed crisis plan, support from social network, calling 911, coming to the Emergency Department, and calling Suicide Hotline.  This service was provided via telemedicine using a 2-way, interactive audio and video technology.  Names of all persons participating in this telemedicine service and their role in this encounter. Name: Brad Moreno Role: Patient  Name: T. Donyell Carrell Role: NP          Oneta Rack, NP 11/11/2019 10:26  AM

## 2019-11-11 NOTE — ED Notes (Signed)
Patient being seen by TTS at this time.

## 2019-11-11 NOTE — ED Notes (Signed)
Received call from TTS whom states that patient may be discharged home. MD made aware.

## 2019-11-11 NOTE — Discharge Instructions (Addendum)
Follow-up as instructed by behavioral health 

## 2019-11-15 DIAGNOSIS — R079 Chest pain, unspecified: Secondary | ICD-10-CM | POA: Diagnosis not present

## 2019-11-16 DIAGNOSIS — Z9114 Patient's other noncompliance with medication regimen: Secondary | ICD-10-CM | POA: Diagnosis not present

## 2019-11-16 DIAGNOSIS — G4700 Insomnia, unspecified: Secondary | ICD-10-CM | POA: Diagnosis not present

## 2019-11-16 DIAGNOSIS — R45850 Homicidal ideations: Secondary | ICD-10-CM | POA: Diagnosis not present

## 2019-11-16 DIAGNOSIS — F200 Paranoid schizophrenia: Secondary | ICD-10-CM | POA: Diagnosis not present

## 2019-11-16 DIAGNOSIS — F17210 Nicotine dependence, cigarettes, uncomplicated: Secondary | ICD-10-CM | POA: Diagnosis not present

## 2019-12-27 DIAGNOSIS — F3113 Bipolar disorder, current episode manic without psychotic features, severe: Secondary | ICD-10-CM | POA: Diagnosis not present

## 2019-12-28 DIAGNOSIS — F3113 Bipolar disorder, current episode manic without psychotic features, severe: Secondary | ICD-10-CM | POA: Diagnosis not present

## 2019-12-29 DIAGNOSIS — F3113 Bipolar disorder, current episode manic without psychotic features, severe: Secondary | ICD-10-CM | POA: Diagnosis not present

## 2019-12-31 DIAGNOSIS — F3113 Bipolar disorder, current episode manic without psychotic features, severe: Secondary | ICD-10-CM | POA: Diagnosis not present

## 2020-01-01 DIAGNOSIS — F3113 Bipolar disorder, current episode manic without psychotic features, severe: Secondary | ICD-10-CM | POA: Diagnosis not present

## 2020-01-02 DIAGNOSIS — F3113 Bipolar disorder, current episode manic without psychotic features, severe: Secondary | ICD-10-CM | POA: Diagnosis not present

## 2020-01-03 DIAGNOSIS — F3113 Bipolar disorder, current episode manic without psychotic features, severe: Secondary | ICD-10-CM | POA: Diagnosis not present

## 2020-01-04 DIAGNOSIS — F3113 Bipolar disorder, current episode manic without psychotic features, severe: Secondary | ICD-10-CM | POA: Diagnosis not present

## 2020-01-05 DIAGNOSIS — F3113 Bipolar disorder, current episode manic without psychotic features, severe: Secondary | ICD-10-CM | POA: Diagnosis not present

## 2020-01-06 DIAGNOSIS — J302 Other seasonal allergic rhinitis: Secondary | ICD-10-CM | POA: Diagnosis not present

## 2020-01-06 DIAGNOSIS — R600 Localized edema: Secondary | ICD-10-CM | POA: Diagnosis not present

## 2020-01-07 DIAGNOSIS — F3113 Bipolar disorder, current episode manic without psychotic features, severe: Secondary | ICD-10-CM | POA: Diagnosis not present

## 2020-01-08 DIAGNOSIS — F3113 Bipolar disorder, current episode manic without psychotic features, severe: Secondary | ICD-10-CM | POA: Diagnosis not present

## 2020-01-09 DIAGNOSIS — F3113 Bipolar disorder, current episode manic without psychotic features, severe: Secondary | ICD-10-CM | POA: Diagnosis not present

## 2020-01-22 ENCOUNTER — Ambulatory Visit (INDEPENDENT_AMBULATORY_CARE_PROVIDER_SITE_OTHER): Payer: Medicaid Other | Admitting: Clinical

## 2020-01-22 ENCOUNTER — Other Ambulatory Visit: Payer: Self-pay

## 2020-01-22 DIAGNOSIS — F251 Schizoaffective disorder, depressive type: Secondary | ICD-10-CM | POA: Diagnosis not present
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
## 2020-01-23 ENCOUNTER — Other Ambulatory Visit: Payer: Self-pay | Admitting: Family Medicine

## 2020-02-06 ENCOUNTER — Emergency Department (HOSPITAL_COMMUNITY)
Admission: EM | Admit: 2020-02-06 | Discharge: 2020-02-06 | Discharge status: Against Medical Advice | Payer: Medicaid Other

## 2020-02-06 ENCOUNTER — Ambulatory Visit
Admission: EM | Admit: 2020-02-06 | Discharge: 2020-02-06 | Disposition: A | Discharge status: Home / Self Care | Payer: Medicaid Other | Attending: Family Medicine | Admitting: Family Medicine

## 2020-02-06 ENCOUNTER — Encounter: Payer: Self-pay | Admitting: Emergency Medicine

## 2020-02-06 DIAGNOSIS — L601 Onycholysis: Secondary | ICD-10-CM

## 2020-02-06 DIAGNOSIS — M79674 Pain in right toe(s): Secondary | ICD-10-CM

## 2020-02-06 MED ORDER — MUPIROCIN CALCIUM 2 % NA OINT: TOPICAL_OINTMENT | NASAL | 0 refills | Status: DC

## 2020-02-06 MED ORDER — IBUPROFEN 800 MG PO TABS: 800.0000 mg | ORAL_TABLET | Freq: Three times a day (TID) | ORAL | 0 refills | Status: DC | PRN

## 2020-02-06 NOTE — ED Triage Notes (Signed)
had a pedicure several days ago and had to cut is toenail on rt great toe extremely short and is worried about infection

## 2020-02-06 NOTE — Discharge Instructions (Addendum)
I have sent in mupirocin cream for you to apply to the area twice a day as needed  I sent in prescription strength ibuprofen for you to take 1 tablet every 8 hours as needed for  Follow up with this office or with primary care if symptoms are persisting.  Follow up in the ER for high fever, trouble swallowing, trouble breathing, other concerning symptoms.

## 2020-02-06 NOTE — ED Provider Notes (Signed)
[facility]    CSN: [id number] Arrival date & time: 02/06/20  1212      History   Chief Complaint No chief complaint on file.   HPI Brad Moreno is a 39 y.o. male.   Reports that he received a pedicure last week and his right toenail has fallen off.  Reports that he thinks he will lose his left toenail as well.  Reports that the area has been draining just a little bit on the right with some amber, yellow looking fluid. Has been soaking feet in Epsom salts at home. Has not attempted other OTC treatment. There are no aggravating or alleviating factors. Denies headache, cough, nausea, vomiting, diarrhea, rash, fever, other symptoms.  ROS per HPI  The history is provided by the patient.    Past Medical History:  Diagnosis Date  . Bipolar 1 disorder (HCC)   . Depression 06/2010  . Psychosis (HCC) 03/23/2017  . Schizophrenia [facility])     Patient Active Problem List   Diagnosis Date Noted  . Current smoker 03/29/2018  . Class 1 obesity with body mass index (BMI) of 33.0 to 33.9 in adult 03/29/2018  . Alcohol use 02/17/2017  . Schizoaffective disorder, bipolar type (HCC) 06/09/2010    Past Surgical History:  Procedure Laterality Date  . WISDOM TOOTH EXTRACTION         Home Medications    Prior to Admission medications   Medication Sig Start Date End Date Taking? Authorizing Provider  ibuprofen (ADVIL) 800 MG tablet Take 1 tablet (800 mg total) by mouth every 8 (eight) hours as needed for moderate pain. 02/06/20  Yes Moshe Cipro, NP  mupirocin nasal ointment (BACTROBAN) 2 % Apply in each nostril daily 02/06/20  Yes Moshe Cipro, NP  ARIPiprazole (ABILIFY) 10 MG tablet TAKE 1 TABLET(10 MG) BY MOUTH DAILY 01/23/20   Donita Brooks, MD  divalproex (DEPAKOTE) 250 MG DR tablet Take 250 mg by mouth 2 (two) times daily. 10/26/19   [provider]    Family History Family History  Problem Relation Age of Onset  . Depression Mother    . Diabetes Mother   . Cancer Paternal Grandfather 51       Colon Cancer  . Alcohol abuse Brother        Was Heavy drinker and smoker--has quit  . Bipolar disorder Maternal Uncle     Social History Social History   Tobacco Use  . Smoking status: Former Smoker    Packs/day: 0.25    Years: 1.00    Pack years: 0.25    Quit date: 02/01/2019    Years since quitting: 1.0  . Smokeless tobacco: Never Used  Vaping Use  . Vaping Use: Never used  Substance Use Topics  . Alcohol use: Yes    Alcohol/week: 3.0 standard drinks    Types: 3 Cans of beer per week    Comment: maybe 6 on weekends only  . Drug use: Not Currently    Types: Marijuana    Comment: marijuana last used 06/16/17     Allergies   Amoxicillin and Pork-derived products   Review of Systems Review of Systems   Physical Exam Triage Vital Signs ED Triage Vitals  Enc Vitals Group     BP 02/06/20 1357 121/81     Pulse Rate 02/06/20 1357 83     Resp 02/06/20 1357 17     Temp 02/06/20 1357 98.3 F (36.8 C)     Temp Source 02/06/20 1357  Oral     SpO2 02/06/20 1357 99 %     Weight --      Height --      Head Circumference --      Peak Flow --      Pain Score 02/06/20 1420 0     Pain Loc --      Pain Edu? --      Excl. in GC? --    No data found.  Updated Vital Signs BP 121/81 (BP Location: Right Arm)   Pulse 83   Temp 98.3 F (36.8 C) (Oral)   Resp 17   SpO2 99%   Visual Acuity Right Eye Distance:   Left Eye Distance:   Bilateral Distance:    Right Eye Near:   Left Eye Near:    Bilateral Near:     Physical Exam Vitals and nursing note reviewed.  Constitutional:      General: He is not in acute distress.    Appearance: Normal appearance. He is well-developed and well-nourished. He is not ill-appearing.  HENT:     Head: Normocephalic and atraumatic.     Nose: Nose normal.     Mouth/Throat:     Mouth: Mucous membranes are moist.     Pharynx: Oropharynx is clear.  Eyes:      Conjunctiva/sclera: Conjunctivae normal.  Cardiovascular:     Rate and Rhythm: Normal rate and regular rhythm.     Heart sounds: Normal heart sounds. No murmur heard.   Pulmonary:     Effort: Pulmonary effort is normal. No respiratory distress.     Breath sounds: Normal breath sounds. No stridor. No wheezing, rhonchi or rales.  Chest:     Chest wall: No tenderness.  Abdominal:     Palpations: Abdomen is soft.     Tenderness: There is no abdominal tenderness.  Musculoskeletal:        General: No edema. Normal range of motion.     Cervical back: Normal range of motion and neck supple.  Skin:    General: Skin is warm and dry.     Capillary Refill: Capillary refill takes less than 2 seconds.  Neurological:     General: No focal deficit present.     Mental Status: He is alert and oriented to person, place, and time.  Psychiatric:        Mood and Affect: Mood and affect and mood normal.        Behavior: Behavior normal.        Thought Content: Thought content normal.      UC Treatments / Results  Labs (all labs ordered are listed, but only abnormal results are displayed) Labs Reviewed - No data to display  EKG   Radiology No results found.  Procedures Procedures (including critical care time)  Medications Ordered in UC Medications - No data to display  Initial Impression / Assessment and Plan / UC Course  I have reviewed the triage vital signs and the nursing notes.  Pertinent labs & imaging results that were available during my care of the patient were reviewed by me and considered in my medical decision making (see chart for details).     Onycholysis Pain around R great toenail  Presents with onycholysis of R great toenail L great toenail also appears to be detached from the base of the nailbed. Prescribed ibuprofen 1 tablet every 8 hours as needed for pain Prescribed mupirocin ointment to apply to the right great toe nailbed May use  this on the left toe  nailbed if the nail begins to fall off Follow-up with this office or primary care as needed Follow-up with the ER for acute worsening symptoms, high fever, trouble swallowing, trouble breathing, other concerning symptoms Verbalized understanding, and agreement treatment plan  Final Clinical Impressions(s) / UC Diagnoses   Final diagnoses:  Pain around toenail, right foot  Onycholysis of toenail     Discharge Instructions     I have sent in mupirocin cream for you to apply to the area twice a day as needed  I sent in prescription strength ibuprofen for you to take 1 tablet every 8 hours as needed for  Follow up with this office or with primary care if symptoms are persisting.  Follow up in the ER for high fever, trouble swallowing, trouble breathing, other concerning symptoms.     ED Prescriptions    Medication Sig Dispense Auth. Provider   mupirocin nasal ointment (BACTROBAN) 2 % Apply in each nostril daily 1 g Moshe Cipro, NP   ibuprofen (ADVIL) 800 MG tablet Take 1 tablet (800 mg total) by mouth every 8 (eight) hours as needed for moderate pain. 21 tablet Moshe Cipro, NP     PDMP not reviewed this encounter.   Moshe Cipro, NP 02/09/20 1130

## 2020-02-14 ENCOUNTER — Other Ambulatory Visit: Payer: Self-pay

## 2020-02-14 ENCOUNTER — Encounter: Payer: Self-pay | Admitting: Family Medicine

## 2020-02-14 ENCOUNTER — Telehealth (HOSPITAL_COMMUNITY): Payer: Self-pay

## 2020-02-14 ENCOUNTER — Ambulatory Visit (INDEPENDENT_AMBULATORY_CARE_PROVIDER_SITE_OTHER): Payer: Medicaid Other | Admitting: Family Medicine

## 2020-02-14 VITALS — BP 148/78 | HR 100 | Temp 98.5°F | Resp 14 | Ht 69.0 in | Wt 219.0 lb

## 2020-02-14 DIAGNOSIS — F209 Schizophrenia, unspecified: Secondary | ICD-10-CM | POA: Diagnosis not present

## 2020-02-14 DIAGNOSIS — Z23 Encounter for immunization: Secondary | ICD-10-CM | POA: Diagnosis not present

## 2020-02-14 DIAGNOSIS — Z202 Contact with and (suspected) exposure to infections with a predominantly sexual mode of transmission: Secondary | ICD-10-CM

## 2020-02-14 DIAGNOSIS — F319 Bipolar disorder, unspecified: Secondary | ICD-10-CM

## 2020-02-14 NOTE — Telephone Encounter (Signed)
Medication management - Telephone call from pt stating he was scheduled and seen for therapy once and would like to see a psychiatrist if possible.  Would like a call back to set up an appointment as appears pt had a one time appointment with Suzan Garibaldi, therapist 01/22/20.  Patient stated he is currently on medications and will be running out soon so would like to set up something. Agreed to have pt to be called back with options or referral.
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
## 2020-02-15 LAB — RPR: RPR Ser Ql: NONREACTIVE

## 2020-02-15 LAB — HIV ANTIBODY (ROUTINE TESTING W REFLEX): HIV 1&2 Ab, 4th Generation: NONREACTIVE

## 2020-02-18 NOTE — Telephone Encounter (Signed)
Staff informed pt and Patient is aware that office currently do not have adult provider to manage medications

## 2020-03-04 ENCOUNTER — Other Ambulatory Visit: Payer: Self-pay

## 2020-03-04 ENCOUNTER — Ambulatory Visit (INDEPENDENT_AMBULATORY_CARE_PROVIDER_SITE_OTHER): Payer: Medicaid Other | Admitting: Clinical

## 2020-03-04 DIAGNOSIS — F251 Schizoaffective disorder, depressive type: Secondary | ICD-10-CM | POA: Diagnosis not present

## 2020-03-04 NOTE — Progress Notes (Addendum)
  Virtual Visit via Video Note  I connected withLogan Ruskon1/25/22at8:00AM EDTby a video enabled telemedicine application and verified that I am speaking with the correct person using two identifiers.  Location: Patient:Home Provider:Office  I discussed the limitations of evaluation and management by telemedicine and the availability of in person appointments. The patient expressed understanding and agreed to proceed.     THERAPIST PROGRESS NOTE  Session Time:10:00AM-10:30AM  Participation Level:Active  Behavioral Response:CasualAlertIrratable  Type of Therapy:Individual Therapy  Treatment Goals addressed:Coping  Interventions:CBT, Motivational Interviewing, Solution Focused and Supportive  Summary:Brad E. Johnsonis a 39y.o.malewho presents with Schizoaffective Disorder. The OPT therapist worked with thepatientfor his initalOPT treatment.The OPT therapist utilized Motivational Interviewing to assist in creating therapeutic repore. The patient in the session was engaged and work in collaboration giving feedback about his triggers and symptoms over the past few weeksincludingadjusting to medication.The OPT therapist utilized Cognitive Behavioral Therapy through cognitive restructuring as well as worked with the patient on coping strategiesto reduce in home conflict.The patient spoke about the speed of his mood changes and was encouraged to continue to use his coping.The OPT therapist worked with the patient oncommunicating his feelingsand utilizing his new coping tools to assist with managing mood and de-escalation as well as encouraged the patients support team to continue urging the patient to use his skills as they notice mood change.  Suicidal/Homicidal:Nowithout intent/plan  Therapist Response:The OPT therapist worked with the patient for the patients scheduled session. The patient was engaged in his session and gave feedback in  relation to triggers, symptoms, and behavior responses over the pastfewweeks. The OPT therapist worked with the patient utilizing an in session Cognitive Behavioral Therapy exercise. The patient was responsive in the session and verbalizedagreement touse his coping tool box.The OPT therapist worked with the patient providing psycho-educationThe patient spoke aboutadding the [facility] johns worts mood stabalizer and seeing some new benefit from adding this to his medication regiment.The OPT therapist will continue treatment work with the patient in his next scheduled session.   Plan: Return again in2/3weeks.  Diagnosis:Axis I:Schizoaffective Disorder, depressed type, without good prognostic features  Axis II:No diagnosis  I discussed the assessment and treatment plan with the patient. The patient was provided an opportunity to ask questions and all were answered. The patient agreed with the plan and demonstrated an understanding of the instructions.  The patient was advised to call back or seek an in-person evaluation if the symptoms worsen or if the condition fails to improve as anticipated.  I provided21minutes of non-face-to-face time during this encounter.  Winfred Burn, LCSW 03/04/2020

## 2020-03-13 ENCOUNTER — Encounter: Payer: Self-pay | Admitting: Family Medicine

## 2020-03-21 ENCOUNTER — Other Ambulatory Visit: Payer: Self-pay | Admitting: Family Medicine

## 2020-04-02 ENCOUNTER — Encounter (HOSPITAL_COMMUNITY): Payer: Self-pay

## 2020-04-02 ENCOUNTER — Other Ambulatory Visit: Payer: Self-pay

## 2020-04-02 ENCOUNTER — Ambulatory Visit (INDEPENDENT_AMBULATORY_CARE_PROVIDER_SITE_OTHER): Payer: Medicaid Other | Admitting: Clinical

## 2020-04-02 DIAGNOSIS — F251 Schizoaffective disorder, depressive type: Secondary | ICD-10-CM | POA: Diagnosis not present

## 2020-04-02 NOTE — Progress Notes (Signed)
Virtual Visit via Video Note  I connected withLogan Ruskon2/23/22at11:00AM EDTby a video enabled telemedicine application and verified that I am speaking with the correct person using two identifiers.  Location: Patient:Home Provider:Office  I discussed the limitations of evaluation and management by telemedicine and the availability of in person appointments. The patient expressed understanding and agreed to proceed.     THERAPIST PROGRESS NOTE  Session Time:10:00AM-10:40AM  Participation Level:Active  Behavioral Response:CasualAlertIrratable  Type of Therapy:Individual Therapy  Treatment Goals addressed:Coping  Interventions:CBT, Motivational Interviewing, Solution Focused and Supportive  Summary:Brad E. Johnsonis a 39y.o.malewho presents with Schizoaffective Disorder. The OPT therapist worked with thepatientfor his ongoingOPT treatment.The OPT therapist utilized Motivational Interviewing to assist in creating therapeutic repore. The patient in the session was engaged and work in collaboration giving feedback about his triggers and symptoms over the past few weeksincludingknee pain and upcoming Divorce proceding .The OPT therapist utilized Cognitive Behavioral Therapy through cognitive restructuring as well as worked with the patient on coping strategiesto reduce in home conflict.The patientspoke about communicating his feelings with his caregivers more appropriately and this helping to improve their relationshipand utilizing his new coping tools to assist with managing mood and de-escalation.  Suicidal/Homicidal:Nowithout intent/plan  Therapist Response:The OPT therapist worked with the patient for the patients scheduled session. The patient was engaged in his session and gave feedback in relation to triggers, symptoms, and behavior responses over the pastfewweeks. The OPT therapist worked with the patient utilizing an in  session Cognitive Behavioral Therapy exercise. The patient was responsive in the session and verbalizedagreement tocontinue to use his coping tool box to assist in mood management.The OPT therapist worked with the patient providing psycho-educationThe patient spoke about the impact of his Divorce process and not being able to have time with his child.The OPT therapist allowed a safe environment for the patient to speak about his stressors .The OPT therapist will continue treatment work with the patient in his next scheduled session.   Plan: Return again in2/3weeks.  Diagnosis:Axis I:Schizoaffective Disorder, Depressed type, without good prognosis  Axis II:No diagnosis  I discussed the assessment and treatment plan with the patient. The patient was provided an opportunity to ask questions and all were answered. The patient agreed with the plan and demonstrated an understanding of the instructions.  The patient was advised to call back or seek an in-person evaluation if the symptoms worsen or if the condition fails to improve as anticipated.  I provided79minutes of non-face-to-face time during this encounter.  Winfred Burn, LCSW  04/02/2020

## 2020-04-30 ENCOUNTER — Other Ambulatory Visit: Payer: Self-pay

## 2020-04-30 ENCOUNTER — Ambulatory Visit (INDEPENDENT_AMBULATORY_CARE_PROVIDER_SITE_OTHER): Payer: Medicaid Other | Admitting: Clinical

## 2020-04-30 DIAGNOSIS — F251 Schizoaffective disorder, depressive type: Secondary | ICD-10-CM | POA: Diagnosis not present

## 2020-04-30 NOTE — Progress Notes (Signed)
Virtual Visit via Telephone Note  I connected with Brad Moreno on 04/30/20 at 11:00 AM EDT by telephone and verified that I am speaking with the correct person using two identifiers.  Location: Patient: Home Provider: Office   I discussed the limitations, risks, security and privacy concerns of performing an evaluation and management service by telephone and the availability of in person appointments. I also discussed with the patient that there may be a patient responsible charge related to this service. The patient expressed understanding and agreed to proceed.    THERAPIST PROGRESS NOTE  Session Time:11:00AM-11:30AM  Participation Level:Active  Behavioral Response:CasualAlertIrratable  Type of Therapy:Individual Therapy  Treatment Goals addressed:Coping  Interventions:CBT, Motivational Interviewing, Solution Focused and Supportive  Summary:Brad E. Johnsonis a39y.o.malewho presents withSchizoaffective Disorder.The OPT therapist worked with thepatientfor hisongoingOPT treatment.The OPT therapist utilized Motivational Interviewing to assist in creating therapeutic repore. The patient in the session was engaged and work in collaboration giving feedback about his triggers and symptoms over the past few weeksincludingmaking a change with his school schedule and deciding to take a class in the upcoming Summer to reduce stress and trying to settle his in the rears child support .The OPT therapist utilized Cognitive Behavioral Therapy through cognitive restructuring as well as worked with the patient on coping strategiesto reduce in home conflict.The patientspoke about communicating his feelings with his caregivers more appropriately and this helping to improve their relationshipand utilizing his new coping tools to assist with managing mood and de-escalation.  Suicidal/Homicidal:Nowithout intent/plan  Therapist Response:The OPT therapist worked  with the patient for the patients scheduled session. The patient was engaged in his session and gave feedback in relation to triggers, symptoms, and behavior responses over the pastfewweeks. The OPT therapist worked with the patient utilizing an in session Cognitive Behavioral Therapy exercise. The patient was responsive in the session and verbalizedagreement tocontinue to use his coping tool box to assist in mood management.The OPT therapist worked with the patient providing psycho-educationThe patient spoke about the impact trying to get his finances in order.The OPT therapist allowed a safe environment for the patient to speak about his stressors .The OPT therapist will continue treatment work with the patient in his next scheduled session.   Plan: Return again in2/3weeks.  Diagnosis:Axis I:Schizoaffective Disorder, Depressed type, without good prognosis  Axis II:No diagnosis  I discussed the assessment and treatment plan with the patient. The patient was provided an opportunity to ask questions and all were answered. The patient agreed with the plan and demonstrated an understanding of the instructions.  The patient was advised to call back or seek an in-person evaluation if the symptoms worsen or if the condition fails to improve as anticipated.  I provided81minutes of non-face-to-face time during this encounter.  Winfred Burn, LCSW  04/30/2020

## 2020-10-15 DIAGNOSIS — F3112 Bipolar disorder, current episode manic without psychotic features, moderate: Secondary | ICD-10-CM | POA: Diagnosis not present

## 2020-11-09 IMAGING — DX DG KNEE 1-2V PORT*R*
4 series · 4 of 4 positions shown · non-contrast
Comparison: None.

CLINICAL DATA: Kicked door

EXAM:
PORTABLE RIGHT KNEE - 1-2 VIEW

[knee ap (1 of 3)]
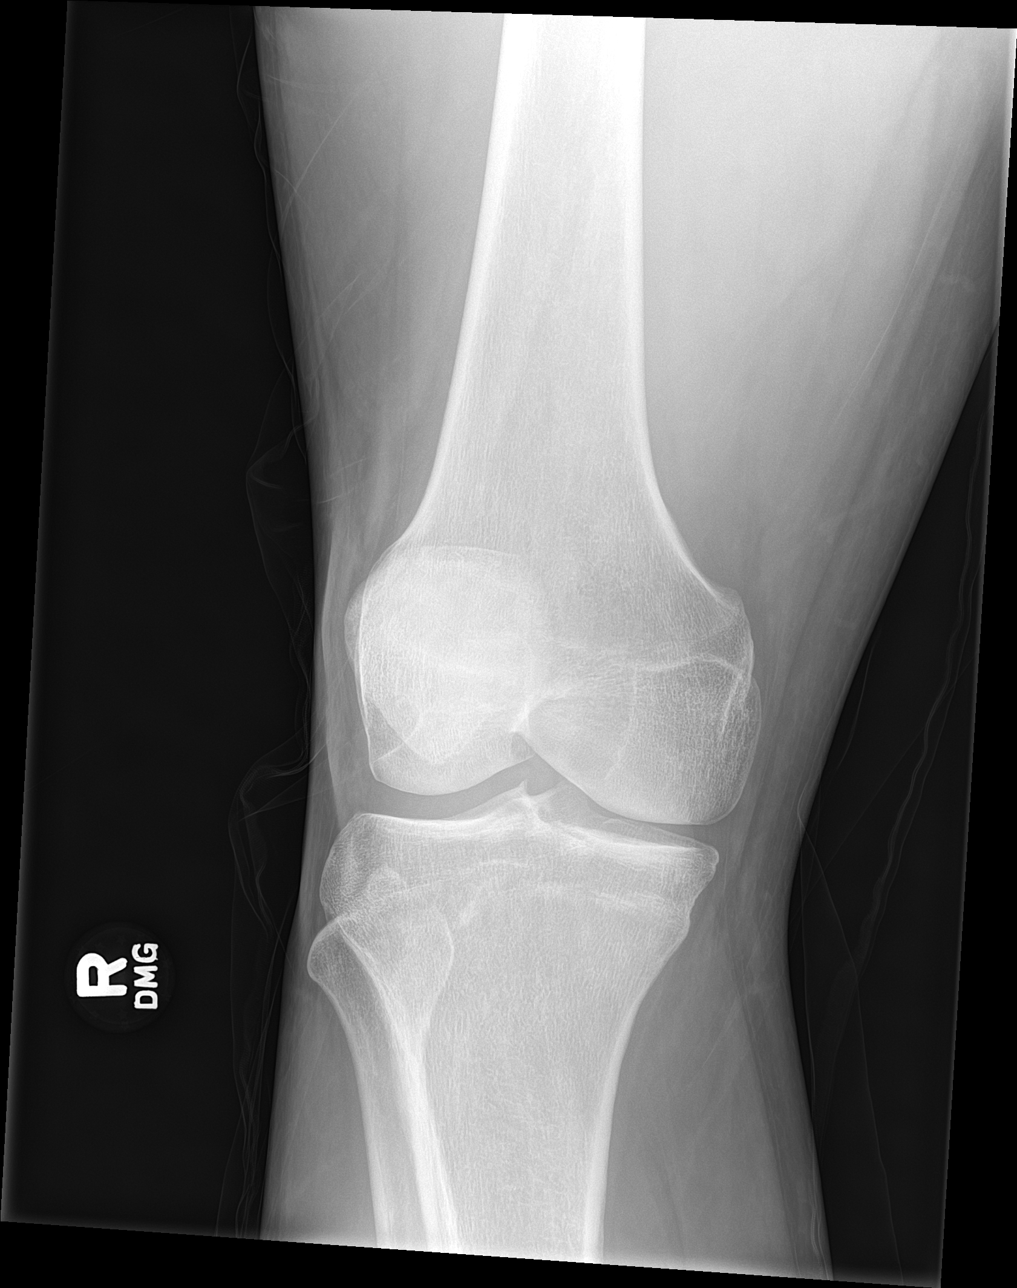

[knee lat]
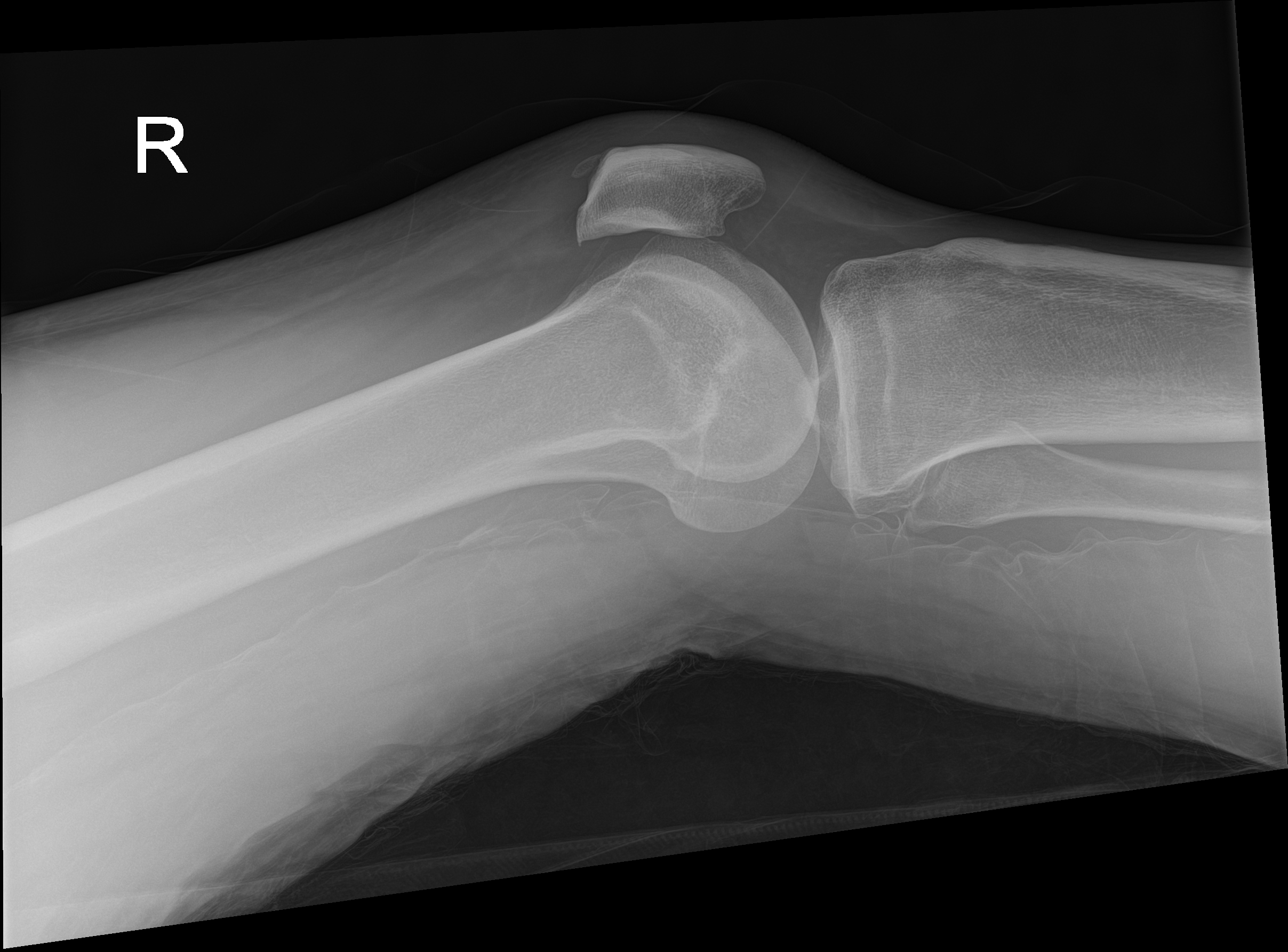

[knee ap (2 of 3)]
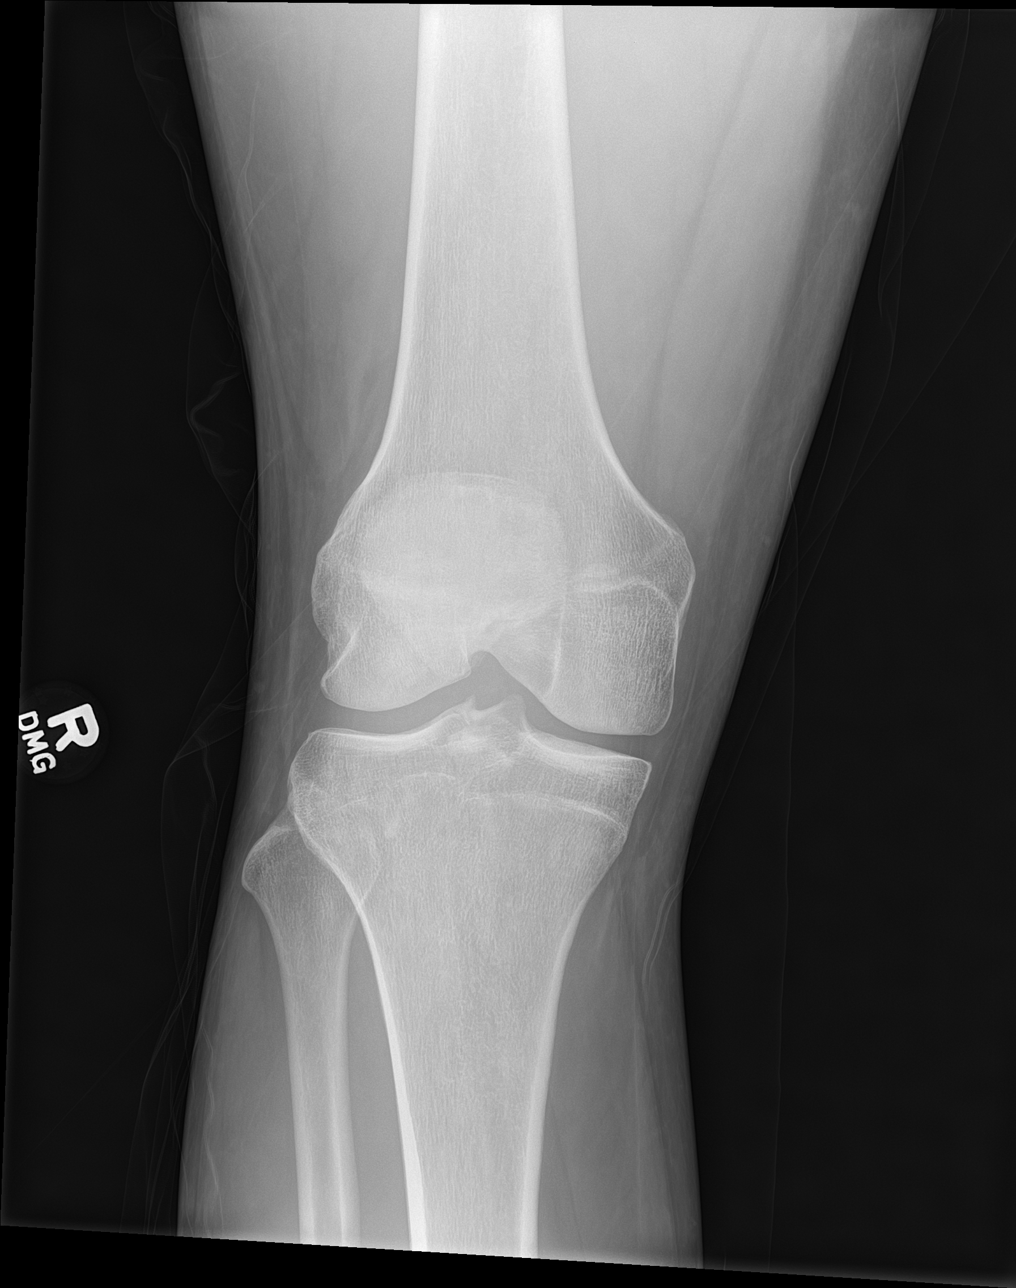

[knee ap (3 of 3)]
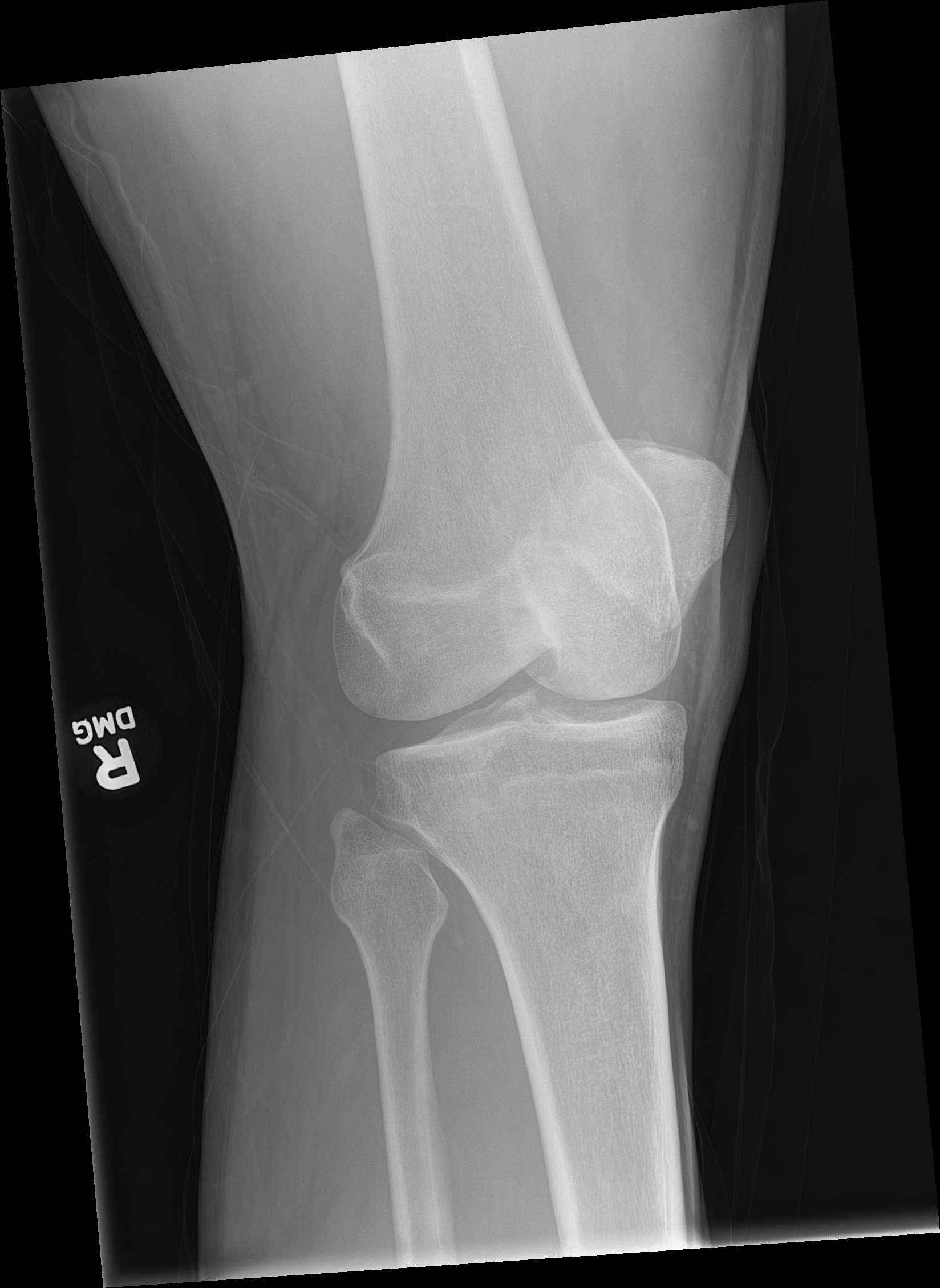

[4 of 4 positions shown; findings below may reference images not displayed]

FINDINGS: No evidence of fracture, dislocation, or joint effusion. No evidence
of arthropathy or other focal bone abnormality. Tiny enthesophytes
seen at the quadriceps insertion site. Soft tissues are
unremarkable.
IMPRESSION: No acute osseous abnormality.

## 2021-02-04 DIAGNOSIS — F3112 Bipolar disorder, current episode manic without psychotic features, moderate: Secondary | ICD-10-CM | POA: Diagnosis not present

## 2021-05-05 DIAGNOSIS — F3112 Bipolar disorder, current episode manic without psychotic features, moderate: Secondary | ICD-10-CM | POA: Diagnosis not present

## 2021-08-25 DIAGNOSIS — F3112 Bipolar disorder, current episode manic without psychotic features, moderate: Secondary | ICD-10-CM | POA: Diagnosis not present

## 2021-11-26 ENCOUNTER — Ambulatory Visit (INDEPENDENT_AMBULATORY_CARE_PROVIDER_SITE_OTHER): Payer: Medicare Other | Admitting: Family Medicine

## 2021-11-26 VITALS — BP 140/94 | HR 83 | Ht 69.0 in | Wt 254.0 lb

## 2021-11-26 DIAGNOSIS — I10 Essential (primary) hypertension: Secondary | ICD-10-CM | POA: Diagnosis not present

## 2021-11-26 MED ORDER — HYDROCHLOROTHIAZIDE 25 MG PO TABS: 25.0000 mg | ORAL_TABLET | Freq: Every day | ORAL | 3 refills | Status: DC

## 2021-11-26 NOTE — Progress Notes (Signed)
Subjective:     Patient ID: Brad Moreno, male   DOB: 1980-02-09, 41 y.o.   MRN: [id number]  Patient has not been seen since 2022.  At that time he was dismissed from our clinic.  Please see the office visit that outlines the details of this.  However he was able to make an appointment today.  Apparently his blood pressures been running very high at his dentist office.  His systolic blood pressure has been fluctuating between 120 and 154.  His diastolic blood pressures consistently been 90-102.  He denies any chest pain shortness of breath or dyspnea exertion.  His weight has increased considerably since I last saw him but he is on medication for his underlying psychiatric disorder which is Abilify 30 mg a day.  Patient is very calm and pleasant today which is a marked difference from his last office visit.. Wt Readings from Last 3 Encounters:  11/26/21 254 lb (115.2 kg)  02/14/20 219 lb (99.3 kg)  09/28/19 191 lb 12.8 oz (87 kg)    Past Medical History:  Diagnosis Date   Bipolar 1 disorder ([place])    Depression 06/2010   Psychosis ([place]) 03/23/2017   Schizophrenia ([place])    Past Surgical History:  Procedure Laterality Date   WISDOM TOOTH EXTRACTION     Current Outpatient Medications on File Prior to Visit  Medication Sig Dispense Refill   ARIPiprazole (ABILIFY) 30 MG tablet Take 30 mg by mouth daily.     divalproex (DEPAKOTE ER) 500 MG 24 hr tablet Take 500 mg by mouth daily.     ibuprofen (ADVIL) 800 MG tablet Take 1 tablet (800 mg total) by mouth every 8 (eight) hours as needed for moderate pain. 21 tablet 0   No current facility-administered medications on file prior to visit.   Allergies  Allergen Reactions   Amoxicillin Other (See Comments)    Unknown reaction when he was young   Pork-Derived Products Other (See Comments)    UNKNOWN   Social History   Socioeconomic History   Marital status: Legally Separated    Spouse name: Not on file   Number of children: Not on file    Years of education: Not on file   Highest education level: Not on file  Occupational History    Employer: [employer]  Tobacco Use   Smoking status: Former    Packs/day: 0.25    Years: 1.00    Total pack years: 0.25    Types: Cigarettes    Quit date: 02/01/2019    Years since quitting: 2.8   Smokeless tobacco: Never  Vaping Use   Vaping Use: Never used  Substance and Sexual Activity   Alcohol use: Yes    Alcohol/week: 3.0 standard drinks of alcohol    Types: 3 Cans of beer per week    Comment: maybe 6 on weekends only   Drug use: Not Currently    Types: Marijuana    Comment: marijuana last used 06/16/17   Sexual activity: Not Currently  Other Topics Concern   Not on file  Social History Narrative   18m, 31f, 68f - has daughters weekends   Works weekdays has kids on weekends   Lives alone   Social Determinants of Health   Financial Resource Strain: Not on file  Food Insecurity: Not on file  Transportation Needs: Not on file  Physical Activity: Unknown (03/24/2018)   Exercise Vital Sign    Days of Exercise per Week: 0 days  Minutes of Exercise per Session: Not on file  Stress: Not on file  Social Connections: Not on file  Intimate Partner Violence: Not on file     Review of Systems  All other systems reviewed and are negative.      Objective:   Physical Exam Vitals reviewed.  Constitutional:      Appearance: Normal appearance. He is obese.  Cardiovascular:     Rate and Rhythm: Normal rate and regular rhythm.     Pulses: Normal pulses.     Heart sounds: Normal heart sounds. No murmur heard.    No friction rub. No gallop.  Pulmonary:     Effort: Pulmonary effort is normal. No respiratory distress.     Breath sounds: Normal breath sounds. No wheezing, rhonchi or rales.  Abdominal:     General: Abdomen is flat. Bowel sounds are normal.     Palpations: Abdomen is soft.  Neurological:     General: No focal deficit present.     Mental Status: He is alert and  oriented to person, place, and time.  Psychiatric:        Attention and Perception: Attention and perception normal.        Mood and Affect: Affect is flat.        Speech: Speech normal. Speech is not rapid and pressured, delayed, slurred or tangential.        Behavior: Behavior normal. Behavior is not agitated or aggressive.        Thought Content: Thought content is not paranoid or delusional. Thought content does not include homicidal or suicidal ideation. Thought content does not include homicidal plan.        Cognition and Memory: Cognition and memory normal.        Judgment: Judgment normal. Judgment is not impulsive or inappropriate.        Assessment:    Benign essential HTN - Plan: COMPLETE METABOLIC PANEL WITH GFR, CBC with Differential/Platelet      Plan:   Patient blood pressure today is elevated.  He has more than 10 readings at his dentist office and at other locations and confirm hypertension.  I suspect that this is likely due to weight gain.  Therefore we will start the patient on hydrochlorothiazide 25 mg daily.  I will check a CBC and a CMP to obtain some basic lab studies to evaluate for any other underlying issues.  Recheck blood pressure in 1 month.  Recommended a low-salt diet, recommended 30 minutes to an hour of exercise 5 days a week.  Recommended weight loss.  I believe all of these interventions would help lower his blood pressure as well

## 2021-11-27 LAB — CBC WITH DIFFERENTIAL/PLATELET
Absolute Monocytes: 546 cells/uL (ref 200–950)
Basophils Absolute: 21 cells/uL (ref 0–200)
Basophils Relative: 0.3 %
Eosinophils Absolute: 147 cells/uL (ref 15–500)
Eosinophils Relative: 2.1 %
HCT: 46 % (ref 38.5–50.0)
Hemoglobin: 15.4 g/dL (ref 13.2–17.1)
Lymphs Abs: 3304 cells/uL (ref 850–3900)
MCH: 26.8 pg — ABNORMAL LOW (ref 27.0–33.0)
MCHC: 33.5 g/dL (ref 32.0–36.0)
MCV: 80 fL (ref 80.0–100.0)
MPV: 10.6 fL (ref 7.5–12.5)
Monocytes Relative: 7.8 %
Neutro Abs: 2982 cells/uL (ref 1500–7800)
Neutrophils Relative %: 42.6 %
Platelets: 244 10*3/uL (ref 140–400)
RBC: 5.75 10*6/uL (ref 4.20–5.80)
RDW: 14 % (ref 11.0–15.0)
Total Lymphocyte: 47.2 %
WBC: 7 10*3/uL (ref 3.8–10.8)

## 2021-11-27 LAB — COMPLETE METABOLIC PANEL WITH GFR
AG Ratio: 1.6 (calc) (ref 1.0–2.5)
ALT: 70 U/L — ABNORMAL HIGH (ref 9–46)
AST: 30 U/L (ref 10–40)
Albumin: 4.5 g/dL (ref 3.6–5.1)
Alkaline phosphatase (APISO): 91 U/L (ref 36–130)
BUN: 15 mg/dL (ref 7–25)
CO2: 27 mmol/L (ref 20–32)
Calcium: 9.7 mg/dL (ref 8.6–10.3)
Chloride: 104 mmol/L (ref 98–110)
Creat: 1.2 mg/dL (ref 0.60–1.26)
Globulin: 2.8 g/dL (calc) (ref 1.9–3.7)
Glucose, Bld: 99 mg/dL (ref 65–99)
Potassium: 4.2 mmol/L (ref 3.5–5.3)
Sodium: 139 mmol/L (ref 135–146)
Total Bilirubin: 0.4 mg/dL (ref 0.2–1.2)
Total Protein: 7.3 g/dL (ref 6.1–8.1)
eGFR: 79 mL/min/{1.73_m2} (ref >=60)

## 2021-12-15 DIAGNOSIS — F3112 Bipolar disorder, current episode manic without psychotic features, moderate: Secondary | ICD-10-CM | POA: Diagnosis not present

## 2021-12-28 ENCOUNTER — Ambulatory Visit: Payer: Medicare Other | Admitting: Family Medicine

## 2022-05-04 ENCOUNTER — Encounter: Payer: Self-pay | Admitting: Internal Medicine

## 2022-05-04 ENCOUNTER — Ambulatory Visit (INDEPENDENT_AMBULATORY_CARE_PROVIDER_SITE_OTHER): Payer: 59 | Admitting: Internal Medicine

## 2022-05-04 VITALS — BP 136/88 | HR 91 | Ht 69.0 in | Wt 246.8 lb

## 2022-05-04 DIAGNOSIS — Z0001 Encounter for general adult medical examination with abnormal findings: Secondary | ICD-10-CM | POA: Diagnosis not present

## 2022-05-04 DIAGNOSIS — I10 Essential (primary) hypertension: Secondary | ICD-10-CM

## 2022-05-04 DIAGNOSIS — Z1329 Encounter for screening for other suspected endocrine disorder: Secondary | ICD-10-CM

## 2022-05-04 DIAGNOSIS — F250 Schizoaffective disorder, bipolar type: Secondary | ICD-10-CM

## 2022-05-04 DIAGNOSIS — Z13220 Encounter for screening for lipoid disorders: Secondary | ICD-10-CM

## 2022-05-04 DIAGNOSIS — Z1321 Encounter for screening for nutritional disorder: Secondary | ICD-10-CM

## 2022-05-04 DIAGNOSIS — Z131 Encounter for screening for diabetes mellitus: Secondary | ICD-10-CM | POA: Diagnosis not present

## 2022-05-04 HISTORY — DX: Encounter for general adult medical examination with abnormal findings: Z00.01

## 2022-05-04 MED ORDER — HYDROCHLOROTHIAZIDE 25 MG PO TABS: 25.0000 mg | ORAL_TABLET | Freq: Every day | ORAL | 3 refills | Status: DC

## 2022-05-04 MED ORDER — AMLODIPINE BESYLATE 5 MG PO TABS: 5.0000 mg | ORAL_TABLET | Freq: Every day | ORAL | 0 refills | Status: DC

## 2022-05-04 NOTE — Patient Instructions (Signed)
It was a pleasure to see you today.  Thank you for giving [place] the opportunity to be involved in your care.  Below is a brief recap of your visit and next steps.  We will plan to see you again in 1 month.  Summary You have established care today Amlodipine 5 mg daily has been added for improved blood pressure control We will check labs Plan for follow up in 1 month for BP check

## 2022-05-04 NOTE — Progress Notes (Signed)
New Patient Office Visit  Subjective    Patient ID: Brad Moreno, male    DOB: 10/15/1980  Age: 42 y.o. MRN: [id number]  CC:  Chief Complaint  Patient presents with   Establish Care    HPI Brad Moreno presents to establish care.  He is a 42 year old male with a previously documented past medical history significant for essential hypertension, BPD 1, schizophrenia, and anxiety.  He has most recently been followed at [facility].  Mr. Brad Moreno reports feeling well today.  He is asymptomatic and his acute concern is elevated blood pressure readings.  He has been taking HCTZ as previously prescribed, but reports systolic readings up to Q000111Q mmHg outside of the office.  He has no additional concerns to discuss.  He is currently on disability and reports that he quit smoking 1 month ago.  He additionally denies alcohol and illicit drug use.  His family medical history is significant for diabetes mellitus and colon cancer in his maternal grandfather.  Acute concerns, chronic medical conditions, and outstanding preventative care items discussed today are individually addressed in A/P below.  Outpatient Encounter Medications as of 05/04/2022  Medication Sig   amLODipine (NORVASC) 5 MG tablet Take 1 tablet (5 mg total) by mouth daily.   ARIPiprazole (ABILIFY) 30 MG tablet Take 30 mg by mouth daily.   divalproex (DEPAKOTE ER) 500 MG 24 hr tablet Take 500 mg by mouth daily.   [DISCONTINUED] hydrochlorothiazide (HYDRODIURIL) 25 MG tablet Take 1 tablet (25 mg total) by mouth daily.   [DISCONTINUED] ibuprofen (ADVIL) 800 MG tablet Take 1 tablet (800 mg total) by mouth every 8 (eight) hours as needed for moderate pain.   hydrochlorothiazide (HYDRODIURIL) 25 MG tablet Take 1 tablet (25 mg total) by mouth daily.   No facility-administered encounter medications on file as of 05/04/2022.    Past Medical History:  Diagnosis Date   Anxiety 01/22/2006   Bipolar 1 disorder ([place])     Depression 06/2010   Encounter for general adult medical examination with abnormal findings 05/04/2022   Hypertension 04/18/2021   Psychosis ([place]) 03/23/2017   Schizophrenia ([place])     Past Surgical History:  Procedure Laterality Date   WISDOM TOOTH EXTRACTION      Family History  Problem Relation Age of Onset   Depression Mother    Diabetes Mother    Cancer Paternal Grandfather 58       Colon Cancer   Alcohol abuse Brother        Was Heavy drinker and smoker--has quit   Bipolar disorder Maternal Uncle     Social History   Socioeconomic History   Marital status: Legally Separated    Spouse name: Not on file   Number of children: Not on file   Years of education: Not on file   Highest education level: Not on file  Occupational History    Employer: [employer]  Tobacco Use   Smoking status: Former    Packs/day: 0.25    Years: 1.00    Additional pack years: 0.00    Total pack years: 0.25    Types: Cigarettes    Quit date: 02/01/2019    Years since quitting: 3.2   Smokeless tobacco: Never  Vaping Use   Vaping Use: Never used  Substance and Sexual Activity   Alcohol use: Not Currently    Alcohol/week: 3.0 standard drinks of alcohol    Types: 3 Cans of beer per week    Comment: maybe  6 on weekends only   Drug use: Not Currently    Types: Marijuana    Comment: marijuana last used 06/16/17   Sexual activity: Not Currently    Birth control/protection: None  Other Topics Concern   Not on file  Social History Narrative   41m, 67f, 37f - has daughters weekends   Works weekdays has kids on weekends   Lives alone   Social Determinants of Health   Financial Resource Strain: Not on file  Food Insecurity: Not on file  Transportation Needs: Not on file  Physical Activity: Unknown (03/24/2018)   Exercise Vital Sign    Days of Exercise per Week: 0 days    Minutes of Exercise per Session: Not on file  Stress: Not on file  Social Connections: Not on file  Intimate Partner  Violence: Not on file   Review of Systems  Constitutional:  Negative for chills and fever.  HENT:  Negative for sore throat.   Respiratory:  Negative for cough and shortness of breath.   Cardiovascular:  Negative for chest pain, palpitations and leg swelling.  Gastrointestinal:  Negative for abdominal pain, blood in stool, constipation, diarrhea, nausea and vomiting.  Genitourinary:  Negative for dysuria and hematuria.  Musculoskeletal:  Negative for myalgias.  Skin:  Negative for itching and rash.  Neurological:  Negative for dizziness and headaches.  Psychiatric/Behavioral:  Negative for depression and suicidal ideas.    Objective    BP 136/88   Pulse 91   Ht 5\' 9"  (1.753 m)   Wt 246 lb 12.8 oz (111.9 kg)   SpO2 95%   BMI 36.45 kg/m   Physical Exam Vitals reviewed.  Constitutional:      General: He is not in acute distress.    Appearance: Normal appearance. He is obese. He is not ill-appearing.  HENT:     Head: Normocephalic and atraumatic.     Right Ear: External ear normal.     Left Ear: External ear normal.     Nose: Nose normal. No congestion or rhinorrhea.     Mouth/Throat:     Mouth: Mucous membranes are moist.     Pharynx: Oropharynx is clear.  Eyes:     General: No scleral icterus.    Extraocular Movements: Extraocular movements intact.     Conjunctiva/sclera: Conjunctivae normal.     Pupils: Pupils are equal, round, and reactive to light.  Cardiovascular:     Rate and Rhythm: Normal rate and regular rhythm.     Pulses: Normal pulses.     Heart sounds: Normal heart sounds. No murmur heard. Pulmonary:     Effort: Pulmonary effort is normal.     Breath sounds: Normal breath sounds. No wheezing, rhonchi or rales.  Abdominal:     General: Abdomen is flat. Bowel sounds are normal. There is no distension.     Palpations: Abdomen is soft.     Tenderness: There is no abdominal tenderness.  Musculoskeletal:        General: No swelling or deformity. Normal range  of motion.     Cervical back: Normal range of motion.  Skin:    General: Skin is warm and dry.     Capillary Refill: Capillary refill takes less than 2 seconds.  Neurological:     General: No focal deficit present.     Mental Status: He is alert and oriented to person, place, and time.     Motor: No weakness.  Psychiatric:  Mood and Affect: Mood normal.        Behavior: Behavior normal.        Thought Content: Thought content normal.    Assessment & Plan:   Problem List Items Addressed This Visit       Essential hypertension    Previously documented history of hypertension.  He is currently prescribed HCTZ 25 mg daily.  BP in office today was 141/87 initially and 136/88 on repeat.  He is concerned about elevated blood pressure readings outside of the office, noting systolic pressures up to Q000111Q mmHg. -Add amlodipine 5 mg daily -Follow-up in 4 weeks for BP check      Schizoaffective disorder, bipolar type ([place])    Followed by psychiatry at [facility] (Dr. Hoyle Barr).  He is currently prescribed Abilify and Depakote. -No medication changes today      Encounter for general adult medical examination with abnormal findings - Primary    Presenting today to establish care.  Previous records and labs been reviewed. -Baseline labs ordered today -Vaccines are up-to-date -We will plan for follow-up in 1 month for BP check      Return in about 4 weeks (around 06/01/2022) for HTN.   Johnette Abraham, MD

## 2022-05-04 NOTE — Assessment & Plan Note (Signed)
Followed by psychiatry at [facility] (Dr. Hoyle Barr).  He is currently prescribed Abilify and Depakote. -No medication changes today

## 2022-05-04 NOTE — Assessment & Plan Note (Signed)
Previously documented history of hypertension.  He is currently prescribed HCTZ 25 mg daily.  BP in office today was 141/87 initially and 136/88 on repeat.  He is concerned about elevated blood pressure readings outside of the office, noting systolic pressures up to Q000111Q mmHg. -Add amlodipine 5 mg daily -Follow-up in 4 weeks for BP check

## 2022-05-04 NOTE — Assessment & Plan Note (Signed)
Presenting today to establish care.  Previous records and labs been reviewed. -Baseline labs ordered today -Vaccines are up-to-date -We will plan for follow-up in 1 month for BP check

## 2022-05-06 LAB — CBC WITH DIFFERENTIAL/PLATELET
Basophils Absolute: 0 10*3/uL (ref 0.0–0.2)
Basos: 0 %
EOS (ABSOLUTE): 0.1 10*3/uL (ref 0.0–0.4)
Eos: 1 %
Hematocrit: 45.7 % (ref 37.5–51.0)
Hemoglobin: 15.4 g/dL (ref 13.0–17.7)
Immature Grans (Abs): 0 10*3/uL (ref 0.0–0.1)
Immature Granulocytes: 0 %
Lymphocytes Absolute: 2.6 10*3/uL (ref 0.7–3.1)
Lymphs: 44 %
MCH: 26.2 pg — ABNORMAL LOW (ref 26.6–33.0)
MCHC: 33.7 g/dL (ref 31.5–35.7)
MCV: 78 fL — ABNORMAL LOW (ref 79–97)
Monocytes Absolute: 0.3 10*3/uL (ref 0.1–0.9)
Monocytes: 5 %
Neutrophils Absolute: 3 10*3/uL (ref 1.4–7.0)
Neutrophils: 50 %
Platelets: 261 10*3/uL (ref 150–450)
RBC: 5.87 x10E6/uL — ABNORMAL HIGH (ref 4.14–5.80)
RDW: 13.5 % (ref 11.6–15.4)
WBC: 6 10*3/uL (ref 3.4–10.8)

## 2022-05-06 LAB — TSH+FREE T4
Free T4: 1.25 ng/dL (ref 0.82–1.77)
TSH: 2.05 u[IU]/mL (ref 0.450–4.500)

## 2022-05-06 LAB — B12 AND FOLATE PANEL
Folate: >20 ng/mL (ref >3.0)
Vitamin B-12: 640 pg/mL (ref 232–1245)

## 2022-05-06 LAB — CMP14+EGFR
ALT: 78 IU/L — ABNORMAL HIGH (ref 0–44)
AST: 28 IU/L (ref 0–40)
Albumin/Globulin Ratio: 1.6 (ref 1.2–2.2)
Albumin: 4.7 g/dL (ref 4.1–5.1)
Alkaline Phosphatase: 114 IU/L (ref 44–121)
BUN/Creatinine Ratio: 12 (ref 9–20)
BUN: 15 mg/dL (ref 6–20)
Bilirubin Total: 0.3 mg/dL (ref 0.0–1.2)
CO2: 23 mmol/L (ref 20–29)
Calcium: 10.3 mg/dL — ABNORMAL HIGH (ref 8.7–10.2)
Chloride: 100 mmol/L (ref 96–106)
Creatinine, Ser: 1.27 mg/dL (ref 0.76–1.27)
Globulin, Total: 2.9 g/dL (ref 1.5–4.5)
Glucose: 106 mg/dL — ABNORMAL HIGH (ref 70–99)
Potassium: 4.2 mmol/L (ref 3.5–5.2)
Sodium: 142 mmol/L (ref 134–144)
Total Protein: 7.6 g/dL (ref 6.0–8.5)
eGFR: 74 mL/min/{1.73_m2} (ref >59)

## 2022-05-06 LAB — LIPID PANEL
Chol/HDL Ratio: 4.6 ratio (ref 0.0–5.0)
Cholesterol, Total: 221 mg/dL — ABNORMAL HIGH (ref 100–199)
HDL: 48 mg/dL (ref >39)
LDL Chol Calc (NIH): 143 mg/dL — ABNORMAL HIGH (ref 0–99)
Triglycerides: 167 mg/dL — ABNORMAL HIGH (ref 0–149)
VLDL Cholesterol Cal: 30 mg/dL (ref 5–40)

## 2022-05-06 LAB — HEMOGLOBIN A1C
Est. average glucose Bld gHb Est-mCnc: 137 mg/dL
Hgb A1c MFr Bld: 6.4 % — ABNORMAL HIGH (ref 4.8–5.6)

## 2022-05-06 LAB — VITAMIN D 25 HYDROXY (VIT D DEFICIENCY, FRACTURES): Vit D, 25-Hydroxy: 41.9 ng/mL (ref 30.0–100.0)

## 2022-06-02 ENCOUNTER — Ambulatory Visit (INDEPENDENT_AMBULATORY_CARE_PROVIDER_SITE_OTHER): Payer: 59 | Admitting: Internal Medicine

## 2022-06-02 ENCOUNTER — Encounter: Payer: Self-pay | Admitting: Internal Medicine

## 2022-06-02 VITALS — BP 126/87 | HR 115 | Ht 69.0 in | Wt 245.2 lb

## 2022-06-02 DIAGNOSIS — R7303 Prediabetes: Secondary | ICD-10-CM

## 2022-06-02 DIAGNOSIS — R7401 Elevation of levels of liver transaminase levels: Secondary | ICD-10-CM | POA: Diagnosis not present

## 2022-06-02 DIAGNOSIS — E669 Obesity, unspecified: Secondary | ICD-10-CM

## 2022-06-02 DIAGNOSIS — R718 Other abnormality of red blood cells: Secondary | ICD-10-CM | POA: Diagnosis not present

## 2022-06-02 DIAGNOSIS — Z6833 Body mass index (BMI) 33.0-33.9, adult: Secondary | ICD-10-CM

## 2022-06-02 DIAGNOSIS — I10 Essential (primary) hypertension: Secondary | ICD-10-CM

## 2022-06-02 MED ORDER — METFORMIN HCL 850 MG PO TABS
ORAL_TABLET | ORAL | 1 refills | Status: DC
Start: 2022-06-02 — End: 2022-09-03

## 2022-06-02 NOTE — Assessment & Plan Note (Signed)
Noted on recent labs.  Likely reflective of hepatic steatosis. -Repeat CMP ordered today

## 2022-06-02 NOTE — Assessment & Plan Note (Signed)
A1c 6.4 on labs from last month.  No prior history of diabetes. -We discussed the diagnosis of prediabetes and reviewed that he is at a significant risk for progressing to diabetes if significant lifestyle modifications are not made.  We reviewed appropriate dietary and exercise changes.  Through shared decision making, we will also start metformin 850 mg twice daily. -Repeat labs at follow-up in 6 months

## 2022-06-02 NOTE — Assessment & Plan Note (Signed)
Presenting today for HTN follow-up.  Amlodipine 5 mg daily was added to his antihypertensive regimen at his last appointment.  He is additionally prescribed HCTZ 25 mg daily.  BP has improved to 126/87 today. -No additional medication changes.  Continue current antihypertensive regimen.

## 2022-06-02 NOTE — Progress Notes (Signed)
Established Patient Office Visit  Subjective   Patient ID: Brad Moreno, male    DOB: March 19, 1980  Age: 42 y.o. MRN: [id number]  Chief Complaint  Patient presents with   Hypertension   Brad Moreno returns to care today for HTN follow-up.  He was last evaluated by me on 3/26 as a new patient presenting to establish care.  Amlodipine 5 mg daily was added to his antihypertensive regimen at that time.  Baseline labs were ordered and 4-week follow-up was arranged.  There have been no acute interval events.  Brad Moreno reports feeling well today.  He is asymptomatic and has no acute concerns to discuss.  Past Medical History:  Diagnosis Date   Anxiety 01/22/2006   Bipolar 1 disorder    Depression 06/2010   Encounter for general adult medical examination with abnormal findings 05/04/2022   Hypertension 04/18/2021   Psychosis 03/23/2017   Schizophrenia    Past Surgical History:  Procedure Laterality Date   WISDOM TOOTH EXTRACTION     Social History   Tobacco Use   Smoking status: Former    Packs/day: 0.25    Years: 1.00    Additional pack years: 0.00    Total pack years: 0.25    Types: Cigarettes    Quit date: 02/01/2019    Years since quitting: 3.3   Smokeless tobacco: Never  Vaping Use   Vaping Use: Never used  Substance Use Topics   Alcohol use: Not Currently    Alcohol/week: 3.0 standard drinks of alcohol    Types: 3 Cans of beer per week    Comment: maybe 6 on weekends only   Drug use: Not Currently    Types: Marijuana    Comment: marijuana last used 06/16/17   Family History  Problem Relation Age of Onset   Depression Mother    Diabetes Mother    Cancer Paternal Grandfather 2       Colon Cancer   Alcohol abuse Brother        Was Heavy drinker and smoker--has quit   Bipolar disorder Maternal Uncle    Allergies  Allergen Reactions   Amoxicillin Other (See Comments)    Unknown reaction when he was young   Pork-Derived Products Other (See Comments)     UNKNOWN   Review of Systems  Constitutional:  Negative for chills and fever.  HENT:  Negative for sore throat.   Respiratory:  Negative for cough and shortness of breath.   Cardiovascular:  Negative for chest pain, palpitations and leg swelling.  Gastrointestinal:  Negative for abdominal pain, blood in stool, constipation, diarrhea, nausea and vomiting.  Genitourinary:  Negative for dysuria and hematuria.  Musculoskeletal:  Negative for myalgias.  Skin:  Negative for itching and rash.  Neurological:  Negative for dizziness and headaches.  Psychiatric/Behavioral:  Negative for depression and suicidal ideas.      Objective:     BP 126/87   Pulse (!) 115   Ht  (1.753 m)   Wt 245 lb 3.2 oz (111.2 kg)   SpO2 97%   BMI 36.21 kg/m  BP Readings from Last 3 Encounters:  06/02/22 126/87  05/04/22 136/88  11/26/21 (!) 140/94   Physical Exam Vitals reviewed.  Constitutional:      General: He is not in acute distress.    Appearance: Normal appearance. He is obese. He is not ill-appearing.  HENT:     Head: Normocephalic and atraumatic.     Right Ear: External ear  normal.     Left Ear: External ear normal.     Nose: Nose normal. No congestion or rhinorrhea.     Mouth/Throat:     Mouth: Mucous membranes are moist.     Pharynx: Oropharynx is clear.  Eyes:     General: No scleral icterus.    Extraocular Movements: Extraocular movements intact.     Conjunctiva/sclera: Conjunctivae normal.     Pupils: Pupils are equal, round, and reactive to light.  Cardiovascular:     Rate and Rhythm: Normal rate and regular rhythm.     Pulses: Normal pulses.     Heart sounds: Normal heart sounds. No murmur heard. Pulmonary:     Effort: Pulmonary effort is normal.     Breath sounds: Normal breath sounds. No wheezing, rhonchi or rales.  Abdominal:     General: Abdomen is flat. Bowel sounds are normal. There is no distension.     Palpations: Abdomen is soft.     Tenderness: There is no  abdominal tenderness.  Musculoskeletal:        General: No swelling or deformity. Normal range of motion.     Cervical back: Normal range of motion.  Skin:    General: Skin is warm and dry.     Capillary Refill: Capillary refill takes less than 2 seconds.  Neurological:     General: No focal deficit present.     Mental Status: He is alert and oriented to person, place, and time.     Motor: No weakness.  Psychiatric:        Mood and Affect: Mood normal.        Behavior: Behavior normal.        Thought Content: Thought content normal.   Last CBC Lab Results  Component Value Date   WBC 6.0 05/04/2022   HGB 15.4 05/04/2022   HCT 45.7 05/04/2022   MCV 78 (L) 05/04/2022   MCH 26.2 (L) 05/04/2022   RDW 13.5 05/04/2022   PLT 261 05/04/2022   Last metabolic panel Lab Results  Component Value Date   GLUCOSE 106 (H) 05/04/2022   NA 142 05/04/2022   K 4.2 05/04/2022   CL 100 05/04/2022   CO2 23 05/04/2022   BUN 15 05/04/2022   CREATININE 1.27 05/04/2022   EGFR 74 05/04/2022   CALCIUM 10.3 (H) 05/04/2022   PROT 7.6 05/04/2022   ALBUMIN 4.7 05/04/2022   LABGLOB 2.9 05/04/2022   AGRATIO 1.6 05/04/2022   BILITOT 0.3 05/04/2022   ALKPHOS 114 05/04/2022   AST 28 05/04/2022   ALT 78 (H) 05/04/2022   ANIONGAP 9 11/10/2019   Last lipids Lab Results  Component Value Date   CHOL 221 (H) 05/04/2022   HDL 48 05/04/2022   LDLCALC 143 (H) 05/04/2022   TRIG 167 (H) 05/04/2022   CHOLHDL 4.6 05/04/2022   Last hemoglobin A1c Lab Results  Component Value Date   HGBA1C 6.4 (H) 05/04/2022   Last thyroid functions Lab Results  Component Value Date   TSH 2.050 05/04/2022   Last vitamin D Lab Results  Component Value Date   VD25OH 41.9 05/04/2022   Last vitamin B12 and Folate Lab Results  Component Value Date   VITAMINB12 640 05/04/2022   FOLATE >20.0 05/04/2022     Assessment & Plan:   Problem List Items Addressed This Visit     Essential hypertension    Presenting  today for HTN follow-up.  Amlodipine 5 mg daily was added to his antihypertensive regimen at  his last appointment.  He is additionally prescribed HCTZ 25 mg daily.  BP has improved to 126/87 today. -No additional medication changes.  Continue current antihypertensive regimen.      Prediabetes    A1c 6.4 on labs from last month.  No prior history of diabetes. -We discussed the diagnosis of prediabetes and reviewed that he is at a significant risk for progressing to diabetes if significant lifestyle modifications are not made.  We reviewed appropriate dietary and exercise changes.  Through shared decision making, we will also start metformin 850 mg twice daily. -Repeat labs at follow-up in 6 months      Low mean corpuscular volume (MCV)    Noted on recent labs.  He endorses a history of iron deficiency and is not currently taking iron supplementation. -Iron studies ordered today      Elevated ALT measurement    Noted on recent labs.  Likely reflective of hepatic steatosis. -Repeat CMP ordered today       Return in about 6 months (around 12/02/2022).    Billie Lade, MD

## 2022-06-02 NOTE — Patient Instructions (Signed)
It was a pleasure to see you today.  Thank you for giving [place] the opportunity to be involved in your care.  Below is a brief recap of your visit and next steps.  We will plan to see you again in 6 months.  Summary Start metformin for diabetes prevention Repeat labs ordered today Follow up in 6 months

## 2022-06-02 NOTE — Assessment & Plan Note (Signed)
Noted on recent labs.  He endorses a history of iron deficiency and is not currently taking iron supplementation. -Iron studies ordered today

## 2022-06-03 LAB — CMP14+EGFR
ALT: 67 IU/L — ABNORMAL HIGH (ref 0–44)
AST: 32 IU/L (ref 0–40)
Albumin/Globulin Ratio: 1.8 (ref 1.2–2.2)
Albumin: 4.9 g/dL (ref 4.1–5.1)
Alkaline Phosphatase: 117 IU/L (ref 44–121)
BUN/Creatinine Ratio: 12 (ref 9–20)
BUN: 13 mg/dL (ref 6–20)
Bilirubin Total: 0.3 mg/dL (ref 0.0–1.2)
CO2: 23 mmol/L (ref 20–29)
Calcium: 10.2 mg/dL (ref 8.7–10.2)
Chloride: 100 mmol/L (ref 96–106)
Creatinine, Ser: 1.11 mg/dL (ref 0.76–1.27)
Globulin, Total: 2.8 g/dL (ref 1.5–4.5)
Glucose: 125 mg/dL — ABNORMAL HIGH (ref 70–99)
Potassium: 3.9 mmol/L (ref 3.5–5.2)
Sodium: 141 mmol/L (ref 134–144)
Total Protein: 7.7 g/dL (ref 6.0–8.5)
eGFR: 87 mL/min/{1.73_m2} (ref >59)

## 2022-06-03 LAB — IRON,TIBC AND FERRITIN PANEL
Ferritin: 122 ng/mL (ref 30–400)
Iron Saturation: 23 % (ref 15–55)
Iron: 90 ug/dL (ref 38–169)
Total Iron Binding Capacity: 389 ug/dL (ref 250–450)
UIBC: 299 ug/dL (ref 111–343)

## 2022-07-31 ENCOUNTER — Other Ambulatory Visit: Payer: Self-pay | Admitting: Internal Medicine

## 2022-07-31 DIAGNOSIS — I10 Essential (primary) hypertension: Secondary | ICD-10-CM

## 2022-09-03 ENCOUNTER — Other Ambulatory Visit: Payer: Self-pay | Admitting: Internal Medicine

## 2022-09-03 DIAGNOSIS — R7303 Prediabetes: Secondary | ICD-10-CM

## 2022-09-03 DIAGNOSIS — E669 Obesity, unspecified: Secondary | ICD-10-CM

## 2022-10-29 ENCOUNTER — Other Ambulatory Visit: Payer: Self-pay | Admitting: Internal Medicine

## 2022-10-29 DIAGNOSIS — I10 Essential (primary) hypertension: Secondary | ICD-10-CM

## 2022-12-01 ENCOUNTER — Ambulatory Visit (INDEPENDENT_AMBULATORY_CARE_PROVIDER_SITE_OTHER): Payer: 59 | Admitting: Internal Medicine

## 2022-12-01 ENCOUNTER — Encounter: Payer: Self-pay | Admitting: Internal Medicine

## 2022-12-01 VITALS — BP 130/76 | HR 109 | Ht 69.0 in | Wt 242.6 lb

## 2022-12-01 DIAGNOSIS — E785 Hyperlipidemia, unspecified: Secondary | ICD-10-CM | POA: Insufficient documentation

## 2022-12-01 DIAGNOSIS — Z2821 Immunization not carried out because of patient refusal: Secondary | ICD-10-CM

## 2022-12-01 DIAGNOSIS — I10 Essential (primary) hypertension: Secondary | ICD-10-CM | POA: Diagnosis not present

## 2022-12-01 DIAGNOSIS — E782 Mixed hyperlipidemia: Secondary | ICD-10-CM

## 2022-12-01 DIAGNOSIS — R7303 Prediabetes: Secondary | ICD-10-CM

## 2022-12-01 DIAGNOSIS — E669 Obesity, unspecified: Secondary | ICD-10-CM

## 2022-12-01 HISTORY — DX: Hyperlipidemia, unspecified: E78.5

## 2022-12-01 NOTE — Assessment & Plan Note (Addendum)
A1c 6.4 on labs from March.  He is currently prescribed metformin 850 mg twice daily.  He has made lifestyle modifications at weight loss 3 pounds since his last appointment.  Requesting repeat A1c today. -Repeat A1c ordered today.  Continue metformin 850 mg twice daily.

## 2022-12-01 NOTE — Assessment & Plan Note (Signed)
Lipid panel updated in March.  Total cholesterol 221 and LDL 143.  His 10-year ASCVD rescore today is 6.4%.  He has focused on lifestyle modifications aimed at weight loss, improving his A1c, and lowering his cholesterol.  Dietary recommendations were requested today. -Mediterranean diet reviewed.  Repeat lipid panel at follow-up in 6 months.

## 2022-12-01 NOTE — Assessment & Plan Note (Signed)
 Remains adequately controlled on current antihypertensive regimen.  No medication changes are indicated today.

## 2022-12-01 NOTE — Assessment & Plan Note (Signed)
He has lost 3 pounds since his last appointment and remains focused on lifestyle modifications aimed at weight loss.  Patient was congratulated on his progress and encouraged to continue making changes in an effort to lose weight.

## 2022-12-01 NOTE — Progress Notes (Signed)
Established Patient Office Visit  Subjective   Patient ID: Brad Moreno, male    DOB: [birth year]  Age: 42 y.o. MRN: [id number]  Chief Complaint  Patient presents with   Hypertension    Six month follow up   Brad Moreno returns to care today for routine follow-up.  He was last evaluated by me on 4/24 for HTN follow-up.  No medication changes were made at that time and 85-month follow-up was arranged.  There have been no acute interval events.  Brad Moreno reports feeling well today.  He is asymptomatic and has no acute concerns to discuss.  He states that he has increased his exercise frequency over the last 6 months and is going to the gym at least 4 days/week.  He has lost 3 pounds since his last appointment.  Past Medical History:  Diagnosis Date   Anxiety 01/22/2006   Bipolar 1 disorder (HCC)    Depression 06/2010   Encounter for general adult medical examination with abnormal findings 05/04/2022   Hyperlipidemia 12/01/2022   Hypertension 04/18/2021   Psychosis (HCC) 03/23/2017   Schizophrenia (HCC)    Past Surgical History:  Procedure Laterality Date   WISDOM TOOTH EXTRACTION     Social History   Tobacco Use   Smoking status: Former    Current packs/day: 0.00    Average packs/day: 0.3 packs/day for 1 year (0.3 ttl pk-yrs)    Types: Cigarettes    Start date: 01/31/2018    Quit date: 02/01/2019    Years since quitting: 3.8   Smokeless tobacco: Never  Vaping Use   Vaping status: Never Used  Substance Use Topics   Alcohol use: Not Currently    Alcohol/week: 3.0 standard drinks of alcohol    Types: 3 Cans of beer per week    Comment: maybe 6 on weekends only   Drug use: Not Currently    Types: Marijuana    Comment: marijuana last used 06/16/17   Family History  Problem Relation Age of Onset   Depression Mother    Diabetes Mother    Cancer Paternal Grandfather 66       Colon Cancer   Alcohol abuse Brother        Was Heavy drinker and smoker--has quit    Bipolar disorder Maternal Uncle    Allergies  Allergen Reactions   Amoxicillin Other (See Comments)    Unknown reaction when he was young   Pork-Derived Products Other (See Comments)    UNKNOWN   Review of Systems  Constitutional:  Negative for chills and fever.  HENT:  Negative for sore throat.   Respiratory:  Negative for cough and shortness of breath.   Cardiovascular:  Negative for chest pain, palpitations and leg swelling.  Gastrointestinal:  Negative for abdominal pain, blood in stool, constipation, diarrhea, nausea and vomiting.  Genitourinary:  Negative for dysuria and hematuria.  Musculoskeletal:  Negative for myalgias.  Skin:  Negative for itching and rash.  Neurological:  Negative for dizziness and headaches.  Psychiatric/Behavioral:  Negative for depression and suicidal ideas.      Objective:     BP 130/76   Pulse (!) 109   Ht 5\' 9"  (1.753 m)   Wt 242 lb 9.6 oz (110 kg)   SpO2 95%   BMI 35.83 kg/m  BP Readings from Last 3 Encounters:  12/01/22 130/76  06/02/22 126/87  05/04/22 136/88   Physical Exam Vitals reviewed.  Constitutional:      General: He is not  in acute distress.    Appearance: Normal appearance. He is obese. He is not ill-appearing.  HENT:     Head: Normocephalic and atraumatic.     Right Ear: External ear normal.     Left Ear: External ear normal.     Nose: Nose normal. No congestion or rhinorrhea.     Mouth/Throat:     Mouth: Mucous membranes are moist.     Pharynx: Oropharynx is clear.  Eyes:     General: No scleral icterus.    Extraocular Movements: Extraocular movements intact.     Conjunctiva/sclera: Conjunctivae normal.     Pupils: Pupils are equal, round, and reactive to light.  Cardiovascular:     Rate and Rhythm: Normal rate and regular rhythm.     Pulses: Normal pulses.     Heart sounds: Normal heart sounds. No murmur heard. Pulmonary:     Effort: Pulmonary effort is normal.     Breath sounds: Normal breath sounds. No  wheezing, rhonchi or rales.  Abdominal:     General: Abdomen is flat. Bowel sounds are normal. There is no distension.     Palpations: Abdomen is soft.     Tenderness: There is no abdominal tenderness.  Musculoskeletal:        General: No swelling or deformity. Normal range of motion.     Cervical back: Normal range of motion.  Skin:    General: Skin is warm and dry.     Capillary Refill: Capillary refill takes less than 2 seconds.  Neurological:     General: No focal deficit present.     Mental Status: He is alert and oriented to person, place, and time.     Motor: No weakness.  Psychiatric:        Mood and Affect: Mood normal.        Behavior: Behavior normal.        Thought Content: Thought content normal.   Last CBC Lab Results  Component Value Date   WBC 6.0 05/04/2022   HGB 15.4 05/04/2022   HCT 45.7 05/04/2022   MCV 78 (L) 05/04/2022   MCH 26.2 (L) 05/04/2022   RDW 13.5 05/04/2022   PLT 261 05/04/2022   Last metabolic panel Lab Results  Component Value Date   GLUCOSE 125 (H) 06/02/2022   NA 141 06/02/2022   K 3.9 06/02/2022   CL 100 06/02/2022   CO2 23 06/02/2022   BUN 13 06/02/2022   CREATININE 1.11 06/02/2022   EGFR 87 06/02/2022   CALCIUM 10.2 06/02/2022   PROT 7.7 06/02/2022   ALBUMIN 4.9 06/02/2022   LABGLOB 2.8 06/02/2022   AGRATIO 1.8 06/02/2022   BILITOT 0.3 06/02/2022   ALKPHOS 117 06/02/2022   AST 32 06/02/2022   ALT 67 (H) 06/02/2022   ANIONGAP 9 11/10/2019   Last lipids Lab Results  Component Value Date   CHOL 221 (H) 05/04/2022   HDL 48 05/04/2022   LDLCALC 143 (H) 05/04/2022   TRIG 167 (H) 05/04/2022   CHOLHDL 4.6 05/04/2022   Last hemoglobin A1c Lab Results  Component Value Date   HGBA1C 6.4 (H) 05/04/2022   Last thyroid functions Lab Results  Component Value Date   TSH 2.050 05/04/2022   Last vitamin D Lab Results  Component Value Date   VD25OH 41.9 05/04/2022   Last vitamin B12 and Folate Lab Results  Component  Value Date   VITAMINB12 640 05/04/2022   FOLATE >20.0 05/04/2022   The 10-year ASCVD risk score (Arnett  DK, et al., 2019) is: 5.5%    Assessment & Plan:   Problem List Items Addressed This Visit       Essential hypertension    Remains adequately controlled on current antihypertensive regimen.  No medication changes are indicated today.      Obesity (BMI 30-39.9)    He has lost 3 pounds since his last appointment and remains focused on lifestyle modifications aimed at weight loss.  Patient was congratulated on his progress and encouraged to continue making changes in an effort to lose weight.      Prediabetes - Primary    A1c 6.4 on labs from March.  He is currently prescribed metformin 850 mg twice daily.  He has made lifestyle modifications at weight loss 3 pounds since his last appointment.  Requesting repeat A1c today. -Repeat A1c ordered today.  Continue metformin 850 mg twice daily.      Hyperlipidemia    Lipid panel updated in March.  Total cholesterol 221 and LDL 143.  His 10-year ASCVD rescore today is 6.4%.  He has focused on lifestyle modifications aimed at weight loss, improving his A1c, and lowering his cholesterol.  Dietary recommendations were requested today. -Mediterranean diet reviewed.  Repeat lipid panel at follow-up in 6 months.      Return in about 6 months (around 06/01/2023) for CPE.   Billie Lade, MD

## 2022-12-01 NOTE — Patient Instructions (Signed)
It was a pleasure to see you today.  Thank you for giving [place] the opportunity to be involved in your care.  Below is a brief recap of your visit and next steps.  We will plan to see you again in 6 months.  Summary No medication changes today Repeat A1c ordered Follow up in 6 months and repeat labs at that time.

## 2022-12-02 LAB — BAYER DCA HB A1C WAIVED: HB A1C (BAYER DCA - WAIVED): 5.9 % — ABNORMAL HIGH (ref 4.8–5.6)

## 2023-01-11 ENCOUNTER — Other Ambulatory Visit: Payer: Self-pay | Admitting: Internal Medicine

## 2023-01-11 DIAGNOSIS — I10 Essential (primary) hypertension: Secondary | ICD-10-CM

## 2023-04-12 ENCOUNTER — Other Ambulatory Visit: Payer: Self-pay | Admitting: Internal Medicine

## 2023-04-12 DIAGNOSIS — I10 Essential (primary) hypertension: Secondary | ICD-10-CM

## 2023-06-01 ENCOUNTER — Ambulatory Visit (INDEPENDENT_AMBULATORY_CARE_PROVIDER_SITE_OTHER): Payer: 59 | Admitting: Internal Medicine

## 2023-06-01 ENCOUNTER — Encounter: Payer: Self-pay | Admitting: Internal Medicine

## 2023-06-01 VITALS — BP 123/83 | HR 104 | Ht 69.0 in | Wt 227.2 lb

## 2023-06-01 DIAGNOSIS — R7303 Prediabetes: Secondary | ICD-10-CM | POA: Diagnosis not present

## 2023-06-01 DIAGNOSIS — Z0001 Encounter for general adult medical examination with abnormal findings: Secondary | ICD-10-CM

## 2023-06-01 DIAGNOSIS — I10 Essential (primary) hypertension: Secondary | ICD-10-CM

## 2023-06-01 DIAGNOSIS — F250 Schizoaffective disorder, bipolar type: Secondary | ICD-10-CM

## 2023-06-01 DIAGNOSIS — E669 Obesity, unspecified: Secondary | ICD-10-CM

## 2023-06-01 DIAGNOSIS — E782 Mixed hyperlipidemia: Secondary | ICD-10-CM

## 2023-06-01 NOTE — Assessment & Plan Note (Signed)
 Remains adequately controlled on current regimen of amlodipine  and HCTZ.  No medication changes are indicated today.

## 2023-06-01 NOTE — Progress Notes (Signed)
 Complete physical exam  Patient: Brad Moreno   DOB: 1980/07/03   43 y.o. Male  MRN: [id number]  Subjective:    Chief Complaint  Patient presents with   Annual Exam    Brad Moreno is a 43 y.o. male who presents today for a complete physical exam. He reports consuming a  low card  diet. Gym/ health club routine includes light weights and treadmill. He generally feels well. He reports sleeping well. He does not have additional problems to discuss today.    Most recent fall risk assessment:    06/01/2023   12:58 PM  Fall Risk   Falls in the past year? 0  Number falls in past yr: 0  Injury with Fall? 0  Risk for fall due to : No Fall Risks  Follow up Falls evaluation completed     Most recent depression screenings:    06/01/2023   12:58 PM 12/01/2022    1:20 PM  PHQ 2/9 Scores  PHQ - 2 Score 0 0  PHQ- 9 Score 0 2    Vision:Within last year and Dental: No current dental problems and Receives regular dental care  Past Medical History:  Diagnosis Date   Anxiety 01/22/2006   Bipolar 1 disorder (HCC)    Depression 06/2010   Encounter for general adult medical examination with abnormal findings 05/04/2022   Hyperlipidemia 12/01/2022   Hypertension 04/18/2021   Psychosis (HCC) 03/23/2017   Schizophrenia (HCC)    Past Surgical History:  Procedure Laterality Date   WISDOM TOOTH EXTRACTION     Social History   Tobacco Use   Smoking status: Former    Current packs/day: 0.00    Average packs/day: 0.3 packs/day for 1 year (0.3 ttl pk-yrs)    Types: Cigarettes    Start date: 01/31/2018    Quit date: 02/01/2019    Years since quitting: 4.3   Smokeless tobacco: Never  Vaping Use   Vaping status: Never Used  Substance Use Topics   Alcohol use: Not Currently    Alcohol/week: 3.0 standard drinks of alcohol    Types: 3 Cans of beer per week    Comment: maybe 6 on weekends only   Drug use: Not Currently    Types: Marijuana    Comment: marijuana last used  06/16/17   Family History  Problem Relation Age of Onset   Depression Mother    Diabetes Mother    Cancer Paternal Grandfather 34       Colon Cancer   Alcohol abuse Brother        Was Heavy drinker and smoker--has quit   Bipolar disorder Maternal Uncle    Allergies  Allergen Reactions   Amoxicillin Other (See Comments)    Unknown reaction when he was young   Pork-Derived Products Other (See Comments)    UNKNOWN   Patient Care Team: Tobi Fortes, MD as PCP - General (Internal Medicine)   Outpatient Medications Prior to Visit  Medication Sig   amLODipine  (NORVASC ) 5 MG tablet TAKE 1 TABLET(5 MG) BY MOUTH DAILY   ARIPiprazole  (ABILIFY ) 30 MG tablet Take 30 mg by mouth daily.   divalproex  (DEPAKOTE  ER) 500 MG 24 hr tablet Take 500 mg by mouth daily.   hydrochlorothiazide  (HYDRODIURIL ) 25 MG tablet TAKE 1 TABLET(25 MG) BY MOUTH DAILY   metFORMIN  (GLUCOPHAGE ) 850 MG tablet TAKE 1 TABLET BY MOUTH DAILY FOR 1 MONTH THEN INCREASE 1 TABLET TWICE DAILY   No facility-administered medications prior to visit.  Review of Systems  Constitutional:  Negative for chills and fever.  HENT:  Negative for sore throat.   Respiratory:  Negative for cough and shortness of breath.   Cardiovascular:  Negative for chest pain, palpitations and leg swelling.  Gastrointestinal:  Negative for abdominal pain, blood in stool, constipation, diarrhea, nausea and vomiting.  Genitourinary:  Negative for dysuria and hematuria.  Musculoskeletal:  Negative for myalgias.  Skin:  Negative for itching and rash.  Neurological:  Negative for dizziness and headaches.  Psychiatric/Behavioral:  Negative for depression and suicidal ideas.       Objective:     BP 123/83   Pulse (!) 104   Ht 5\' 9"  (1.753 m)   Wt 227 lb 3.2 oz (103.1 kg)   SpO2 97%   BMI 33.55 kg/m  BP Readings from Last 3 Encounters:  06/01/23 123/83  12/01/22 130/76  06/02/22 126/87   Physical Exam Vitals reviewed.  Constitutional:       General: He is not in acute distress.    Appearance: Normal appearance. He is obese. He is not ill-appearing.  HENT:     Head: Normocephalic and atraumatic.     Right Ear: External ear normal.     Left Ear: External ear normal.     Nose: Nose normal. No congestion or rhinorrhea.     Mouth/Throat:     Mouth: Mucous membranes are moist.     Pharynx: Oropharynx is clear.  Eyes:     General: No scleral icterus.    Extraocular Movements: Extraocular movements intact.     Conjunctiva/sclera: Conjunctivae normal.     Pupils: Pupils are equal, round, and reactive to light.  Cardiovascular:     Rate and Rhythm: Normal rate and regular rhythm.     Pulses: Normal pulses.     Heart sounds: Normal heart sounds. No murmur heard. Pulmonary:     Effort: Pulmonary effort is normal.     Breath sounds: Normal breath sounds. No wheezing, rhonchi or rales.  Abdominal:     General: Abdomen is flat. Bowel sounds are normal. There is no distension.     Palpations: Abdomen is soft.     Tenderness: There is no abdominal tenderness.  Musculoskeletal:        General: No swelling or deformity. Normal range of motion.     Cervical back: Normal range of motion.  Skin:    General: Skin is warm and dry.     Capillary Refill: Capillary refill takes less than 2 seconds.  Neurological:     General: No focal deficit present.     Mental Status: He is alert and oriented to person, place, and time.     Motor: No weakness.  Psychiatric:        Mood and Affect: Mood normal.        Behavior: Behavior normal.        Thought Content: Thought content normal.   Last CBC Lab Results  Component Value Date   WBC 6.0 05/04/2022   HGB 15.4 05/04/2022   HCT 45.7 05/04/2022   MCV 78 (L) 05/04/2022   MCH 26.2 (L) 05/04/2022   RDW 13.5 05/04/2022   PLT 261 05/04/2022   Last metabolic panel Lab Results  Component Value Date   GLUCOSE 125 (H) 06/02/2022   NA 141 06/02/2022   K 3.9 06/02/2022   CL 100 06/02/2022    CO2 23 06/02/2022   BUN 13 06/02/2022   CREATININE 1.11 06/02/2022   EGFR  87 06/02/2022   CALCIUM  10.2 06/02/2022   PROT 7.7 06/02/2022   ALBUMIN 4.9 06/02/2022   LABGLOB 2.8 06/02/2022   AGRATIO 1.8 06/02/2022   BILITOT 0.3 06/02/2022   ALKPHOS 117 06/02/2022   AST 32 06/02/2022   ALT 67 (H) 06/02/2022   ANIONGAP 9 11/10/2019   Last lipids Lab Results  Component Value Date   CHOL 221 (H) 05/04/2022   HDL 48 05/04/2022   LDLCALC 143 (H) 05/04/2022   TRIG 167 (H) 05/04/2022   CHOLHDL 4.6 05/04/2022   Last hemoglobin A1c Lab Results  Component Value Date   HGBA1C 5.9 (H) 12/01/2022   Last thyroid  functions Lab Results  Component Value Date   TSH 2.050 05/04/2022   Last vitamin D  Lab Results  Component Value Date   VD25OH 41.9 05/04/2022   Last vitamin B12 and Folate Lab Results  Component Value Date   VITAMINB12 640 05/04/2022   FOLATE >20.0 05/04/2022        Assessment & Plan:    Routine Health Maintenance and Physical Exam  Immunization History  Administered Date(s) Administered   Hepatitis B 12/16/1993, 01/13/1994, 07/07/1994   Influenza,inj,Quad PF,6+ Mos 02/27/2014, 12/05/2014, 02/14/2020   Influenza-Unspecified 12/24/2017, 12/10/2018   Moderna Sars-Covid-2 Vaccination 06/12/2019   Tdap 08/01/2013    Health Maintenance  Topic Date Due   Medicare Annual Wellness (AWV)  Never done   COVID-19 Vaccine (2 - 2024-25 season) 10/10/2022   DTaP/Tdap/Td (2 - Td or Tdap) 08/02/2023   INFLUENZA VACCINE  09/09/2023   Hepatitis C Screening  Completed   HIV Screening  Completed   HPV VACCINES  Aged Out   Meningococcal B Vaccine  Aged Out    Discussed health benefits of physical activity, and encouraged him to engage in regular exercise appropriate for his age and condition.  Problem List Items Addressed This Visit       Essential hypertension   Remains adequately controlled on current regimen of amlodipine  and HCTZ.  No medication changes are  indicated today.      Schizoaffective disorder, bipolar type (HCC)   Stable.  Followed by Dr. Janelle Mediate at [facility].  He remains on Abilify  and Depakote .      Obesity (BMI 30-39.9)   Current weight 227 pounds.  BMI 33.5.  He has lost 15 pounds through lifestyle modifications aimed at weight loss since his last appointment.  He was congratulated on his progress and encouraged to maintain current healthy practices.      Encounter for well adult exam with abnormal findings - Primary   Annual physical completed today.  Previous records and labs reviewed. -Repeat labs ordered today -Preventative care items are up-to-date -We will tentatively plan for follow-up in 1 year for annual physical      Prediabetes   A1c improved to 5.9 on labs from October 2024.  He remains on metformin  850 mg twice daily and has lost 15 pounds since his last appointment.  He was congratulated on his progress.  Repeat A1c ordered today.      Hyperlipidemia   Lipid panel last updated in March 2024.  Total cholesterol 221 and LDL 143.  He has made significant dietary changes in an effort to lose weight and improve his cholesterol.  His weight is down 15 pounds from October 2024.  Repeat lipid panel ordered today.      Return in about 1 year (around 05/31/2024) for CPE.  Tobi Fortes, MD

## 2023-06-01 NOTE — Assessment & Plan Note (Signed)
 Current weight 227 pounds.  BMI 33.5.  He has lost 15 pounds through lifestyle modifications aimed at weight loss since his last appointment.  He was congratulated on his progress and encouraged to maintain current healthy practices.

## 2023-06-01 NOTE — Assessment & Plan Note (Signed)
 Lipid panel last updated in March 2024.  Total cholesterol 221 and LDL 143.  He has made significant dietary changes in an effort to lose weight and improve his cholesterol.  His weight is down 15 pounds from October 2024.  Repeat lipid panel ordered today.

## 2023-06-01 NOTE — Assessment & Plan Note (Signed)
 Stable.  Followed by Dr. Janelle Mediate at [facility].  He remains on Abilify  and Depakote .

## 2023-06-01 NOTE — Assessment & Plan Note (Signed)
 A1c improved to 5.9 on labs from October 2024.  He remains on metformin  850 mg twice daily and has lost 15 pounds since his last appointment.  He was congratulated on his progress.  Repeat A1c ordered today.

## 2023-06-01 NOTE — Patient Instructions (Signed)
 It was a pleasure to see you today.  Thank you for giving us  the opportunity to be involved in your care.  Below is a brief recap of your visit and next steps.  We will plan to see you again in 1 year.  Summary Annual physical today No medication changes were made Repeat labs ordered Follow up in 1 year for annual physical

## 2023-06-01 NOTE — Assessment & Plan Note (Signed)
 Annual physical completed today.  Previous records and labs reviewed. -Repeat labs ordered today -Preventative care items are up-to-date -We will tentatively plan for follow-up in 1 year for annual physical

## 2023-06-02 ENCOUNTER — Encounter: Payer: Self-pay | Admitting: Internal Medicine

## 2023-06-02 LAB — CBC WITH DIFFERENTIAL/PLATELET
Basophils Absolute: 0 10*3/uL (ref 0.0–0.2)
Basos: 0 %
EOS (ABSOLUTE): 0.2 10*3/uL (ref 0.0–0.4)
Eos: 3 %
Hematocrit: 45.6 % (ref 37.5–51.0)
Hemoglobin: 14.8 g/dL (ref 13.0–17.7)
Immature Grans (Abs): 0 10*3/uL (ref 0.0–0.1)
Immature Granulocytes: 0 %
Lymphocytes Absolute: 2.9 10*3/uL (ref 0.7–3.1)
Lymphs: 45 %
MCH: 25.7 pg — ABNORMAL LOW (ref 26.6–33.0)
MCHC: 32.5 g/dL (ref 31.5–35.7)
MCV: 79 fL (ref 79–97)
Monocytes Absolute: 0.4 10*3/uL (ref 0.1–0.9)
Monocytes: 7 %
Neutrophils Absolute: 2.9 10*3/uL (ref 1.4–7.0)
Neutrophils: 45 %
Platelets: 278 10*3/uL (ref 150–450)
RBC: 5.76 x10E6/uL (ref 4.14–5.80)
RDW: 13.9 % (ref 11.6–15.4)
WBC: 6.4 10*3/uL (ref 3.4–10.8)

## 2023-06-02 LAB — TSH+FREE T4
Free T4: 1.38 ng/dL (ref 0.82–1.77)
TSH: 2.77 u[IU]/mL (ref 0.450–4.500)

## 2023-06-02 LAB — CMP14+EGFR
ALT: 32 IU/L (ref 0–44)
AST: 45 IU/L — ABNORMAL HIGH (ref 0–40)
Albumin: 4.7 g/dL (ref 4.1–5.1)
Alkaline Phosphatase: 81 IU/L (ref 44–121)
BUN/Creatinine Ratio: 11 (ref 9–20)
BUN: 14 mg/dL (ref 6–24)
Bilirubin Total: 0.2 mg/dL (ref 0.0–1.2)
CO2: 24 mmol/L (ref 20–29)
Calcium: 9.9 mg/dL (ref 8.7–10.2)
Chloride: 101 mmol/L (ref 96–106)
Creatinine, Ser: 1.29 mg/dL — ABNORMAL HIGH (ref 0.76–1.27)
Globulin, Total: 2.6 g/dL (ref 1.5–4.5)
Glucose: 79 mg/dL (ref 70–99)
Potassium: 4.3 mmol/L (ref 3.5–5.2)
Sodium: 140 mmol/L (ref 134–144)
Total Protein: 7.3 g/dL (ref 6.0–8.5)
eGFR: 72 mL/min/{1.73_m2} (ref >59)

## 2023-06-02 LAB — LIPID PANEL
Chol/HDL Ratio: 5 ratio (ref 0.0–5.0)
Cholesterol, Total: 185 mg/dL (ref 100–199)
HDL: 37 mg/dL — ABNORMAL LOW (ref >39)
LDL Chol Calc (NIH): 123 mg/dL — ABNORMAL HIGH (ref 0–99)
Triglycerides: 139 mg/dL (ref 0–149)
VLDL Cholesterol Cal: 25 mg/dL (ref 5–40)

## 2023-06-02 LAB — B12 AND FOLATE PANEL
Folate: >20 ng/mL (ref >3.0)
Vitamin B-12: 738 pg/mL (ref 232–1245)

## 2023-06-02 LAB — HEMOGLOBIN A1C
Est. average glucose Bld gHb Est-mCnc: 123 mg/dL
Hgb A1c MFr Bld: 5.9 % — ABNORMAL HIGH (ref 4.8–5.6)

## 2023-06-02 LAB — VITAMIN D 25 HYDROXY (VIT D DEFICIENCY, FRACTURES): Vit D, 25-Hydroxy: 48.4 ng/mL (ref 30.0–100.0)

## 2023-06-07 ENCOUNTER — Other Ambulatory Visit: Payer: Self-pay | Admitting: Internal Medicine

## 2023-06-07 DIAGNOSIS — R7303 Prediabetes: Secondary | ICD-10-CM

## 2023-06-07 DIAGNOSIS — E66811 Obesity, class 1: Secondary | ICD-10-CM

## 2023-07-12 ENCOUNTER — Other Ambulatory Visit: Payer: Self-pay | Admitting: Internal Medicine

## 2023-07-12 DIAGNOSIS — I10 Essential (primary) hypertension: Secondary | ICD-10-CM

## 2023-10-11 ENCOUNTER — Other Ambulatory Visit: Payer: Self-pay | Admitting: Internal Medicine

## 2023-10-11 DIAGNOSIS — I10 Essential (primary) hypertension: Secondary | ICD-10-CM

## 2023-12-07 ENCOUNTER — Other Ambulatory Visit: Payer: Self-pay | Admitting: Internal Medicine

## 2023-12-07 DIAGNOSIS — R7303 Prediabetes: Secondary | ICD-10-CM

## 2023-12-07 DIAGNOSIS — Z6833 Body mass index (BMI) 33.0-33.9, adult: Secondary | ICD-10-CM

## 2024-01-05 ENCOUNTER — Other Ambulatory Visit: Payer: Self-pay

## 2024-01-05 DIAGNOSIS — I10 Essential (primary) hypertension: Secondary | ICD-10-CM

## 2024-02-17 ENCOUNTER — Ambulatory Visit

## 2024-02-17 VITALS — BP 111/74 | HR 85 | Ht 69.0 in | Wt 159.0 lb

## 2024-02-17 DIAGNOSIS — Z2821 Immunization not carried out because of patient refusal: Secondary | ICD-10-CM

## 2024-02-17 DIAGNOSIS — Z0000 Encounter for general adult medical examination without abnormal findings: Secondary | ICD-10-CM | POA: Diagnosis not present

## 2024-02-17 NOTE — Patient Instructions (Signed)
 Mr. Brad Moreno,  Thank you for taking the time for your Medicare Wellness Visit. I appreciate your continued commitment to your health goals. Please review the care plan we discussed, and feel free to reach out if I can assist you further.  Please note that Annual Wellness Visits do not include a physical exam. Some assessments may be limited, especially if the visit was conducted virtually. If needed, we may recommend an in-person follow-up with your provider.  Ongoing Care Seeing your primary care provider every 3 to 6 months helps us  monitor your health and provide consistent, personalized care.   1 year follow up for Medicare well visit: February 18, 2025 at 3:50 pm with medicare wellness nurse in office  Recommended Screenings:  Health Maintenance  Topic Date Due   Medicare Annual Wellness Visit  Never done   HPV Vaccine (1 - 3-dose SCDM series) Never done   DTaP/Tdap/Td vaccine (2 - Td or Tdap) 08/02/2023   Flu Shot  09/09/2023   COVID-19 Vaccine (2 - 2025-26 season) 10/10/2023   Hepatitis B Vaccine  Completed   Hepatitis C Screening  Completed   HIV Screening  Completed   Pneumococcal Vaccine  Aged Out   Meningitis B Vaccine  Aged Out       02/13/2024    9:16 AM  Advanced Directives  Does Patient Have a Medical Advance Directive? No  Would patient like information on creating a medical advance directive? Yes (MAU/Ambulatory/Procedural Areas - Information given)    Vision: Annual vision screenings are recommended for early detection of glaucoma, cataracts, and diabetic retinopathy. These exams can also reveal signs of chronic conditions such as diabetes and high blood pressure.  Dental: Annual dental screenings help detect early signs of oral cancer, gum disease, and other conditions linked to overall health, including heart disease and diabetes.  Please see the attached documents for additional preventive care recommendations.

## 2024-02-17 NOTE — Progress Notes (Signed)
 "  Chief Complaint  Patient presents with   Medicare Wellness    Vaccines not given: Flu vaccine declined today Subjective:   Brad Moreno is a 44 y.o. male who presents for a Medicare Annual Wellness Visit.  Visit info / Clinical Intake: Medicare Wellness Visit Type:: Initial Annual Wellness Visit Persons participating in visit and providing information:: patient Medicare Wellness Visit Mode:: In-person (required for WTM) Interpreter Needed?: No Pre-visit prep was completed: yes AWV questionnaire completed by patient prior to visit?: yes Date:: 02/13/24 Living arrangements:: (Patient-Rptd) with family/others Patient's Overall Health Status Rating: (Patient-Rptd) good Typical amount of pain: (Patient-Rptd) none Does pain affect daily life?: (Patient-Rptd) no Are you currently prescribed opioids?: no  Dietary Habits and Nutritional Risks How many meals a day?: (Patient-Rptd) 2 Eats fruit and vegetables daily?: (Patient-Rptd) yes Most meals are obtained by: (Patient-Rptd) eating out In the last 2 weeks, have you had any of the following?: none Diabetic:: no  Functional Status Activities of Daily Living (to include ambulation/medication): (Patient-Rptd) Independent Ambulation: (Patient-Rptd) Independent Medication Administration: (Patient-Rptd) Independent Home Management (perform basic housework or laundry): (Patient-Rptd) Independent Manage your own finances?: (Patient-Rptd) yes Primary transportation is: (Patient-Rptd) driving Concerns about vision?: no *vision screening is required for WTM* Concerns about hearing?: no  Fall Screening Falls in the past year?: (Patient-Rptd) 0 Number of falls in past year: 0 Was there an injury with Fall?: 0 Fall Risk Category Calculator: 0 Patient Fall Risk Level: Low Fall Risk  Fall Risk Patient at Risk for Falls Due to: No Fall Risks Fall risk Follow up: Falls evaluation completed; Education provided; Falls prevention  discussed  Home and Transportation Safety: All rugs have non-skid backing?: (Patient-Rptd) yes All stairs or steps have railings?: (Patient-Rptd) yes Grab bars in the bathtub or shower?: (Patient-Rptd) yes Have non-skid surface in bathtub or shower?: (Patient-Rptd) yes Good home lighting?: (Patient-Rptd) yes Regular seat belt use?: (Patient-Rptd) yes Hospital stays in the last year:: (Patient-Rptd) no  Cognitive Assessment Difficulty concentrating, remembering, or making decisions? : (Patient-Rptd) no Will 6CIT or Mini Cog be Completed: yes What year is it?: 0 points What month is it?: 0 points Give patient an address phrase to remember (5 components): [address] [place] About what time is it?: 0 points Count backwards from 20 to 1: 0 points Say the months of the year in reverse: 0 points Repeat the address phrase from earlier: 0 points 6 CIT Score: 0 points  Advance Directives (For Healthcare) Does Patient Have a Medical Advance Directive?: No Would patient like information on creating a medical advance directive?: Yes (MAU/Ambulatory/Procedural Areas - Information given)  Reviewed/Updated  Reviewed/Updated: Reviewed All (Medical, Surgical, Family, Medications, Allergies, Care Teams, Patient Goals)    Allergies (verified) Amoxicillin and Porcine (pork) protein-containing drug products   Current Medications (verified) Outpatient Encounter Medications as of 02/17/2024  Medication Sig   amLODipine  (NORVASC ) 5 MG tablet TAKE 1 TABLET(5 MG) BY MOUTH DAILY   ARIPiprazole  (ABILIFY ) 30 MG tablet Take 30 mg by mouth daily.   divalproex  (DEPAKOTE  ER) 500 MG 24 hr tablet Take 500 mg by mouth daily.   hydrochlorothiazide  (HYDRODIURIL ) 25 MG tablet TAKE 1 TABLET(25 MG) BY MOUTH DAILY   metFORMIN  (GLUCOPHAGE ) 850 MG tablet TAKE 1 TABLET BY MOUTH DAILY FOR 1 MONTH THEN INCREASE 1 TABLET TWICE DAILY   No facility-administered encounter medications on file as of 02/17/2024.     History: Past Medical History:  Diagnosis Date   Anxiety 01/22/2006   Bipolar 1 disorder (  HCC)    Depression 06/2010   Encounter for general adult medical examination with abnormal findings 05/04/2022   Hyperlipidemia 12/01/2022   Hypertension 04/18/2021   Psychosis (HCC) 03/23/2017   Schizophrenia (HCC)    Past Surgical History:  Procedure Laterality Date   WISDOM TOOTH EXTRACTION     Family History  Problem Relation Age of Onset   Depression Mother    Diabetes Mother    Cancer Paternal Grandfather 67       Colon Cancer   Alcohol abuse Brother        Was Heavy drinker and smoker--has quit   Bipolar disorder Maternal Uncle    Social History   Occupational History    Employer: [employer]  Tobacco Use   Smoking status: Former    Current packs/day: 0.00    Average packs/day: 0.3 packs/day for 1 year (0.3 ttl pk-yrs)    Types: Cigarettes    Start date: 01/31/2018    Quit date: 02/01/2019    Years since quitting: 5.0   Smokeless tobacco: Never  Vaping Use   Vaping status: Never Used  Substance and Sexual Activity   Alcohol use: Not Currently    Alcohol/week: 3.0 standard drinks of alcohol    Types: 3 Cans of beer per week    Comment: maybe 6 on weekends only   Drug use: Not Currently    Types: Marijuana    Comment: marijuana last used 06/16/17   Sexual activity: Not Currently    Birth control/protection: None   Tobacco Counseling Counseling given: Yes  SDOH Screenings   Food Insecurity: No Food Insecurity (02/13/2024)  Housing: Low Risk (02/13/2024)  Transportation Needs: No Transportation Needs (02/13/2024)  Utilities: Not At Risk (02/17/2024)  Alcohol Screen: Low Risk (02/13/2024)  Depression (PHQ2-9): Low Risk (02/17/2024)  Financial Resource Strain: Low Risk (02/13/2024)  Physical Activity: Insufficiently Active (02/13/2024)  Social Connections: Moderately Isolated (02/13/2024)  Stress: No Stress Concern Present (02/13/2024)  Tobacco Use: Medium Risk (02/17/2024)  Health  Literacy: Adequate Health Literacy (02/17/2024)   See flowsheets for full screening details  Depression Screen PHQ 2 & 9 Depression Scale- Over the past 2 weeks, how often have you been bothered by any of the following problems? Little interest or pleasure in doing things: 0 Feeling down, depressed, or hopeless (PHQ Adolescent also includes...irritable): 0 PHQ-2 Total Score: 0 Trouble falling or staying asleep, or sleeping too much: 0 Feeling tired or having little energy: 0 Poor appetite or overeating (PHQ Adolescent also includes...weight loss): 0 Feeling bad about yourself - or that you are a failure or have let yourself or your family down: 0 Trouble concentrating on things, such as reading the newspaper or watching television (PHQ Adolescent also includes...like school work): 0 Moving or speaking so slowly that other people could have noticed. Or the opposite - being so fidgety or restless that you have been moving around a lot more than usual: 0 Thoughts that you would be better off dead, or of hurting yourself in some way: 0 PHQ-9 Total Score: 0 If you checked off any problems, how difficult have these problems made it for you to do your work, take care of things at home, or get along with other people?: Not difficult at all  Depression Treatment Depression Interventions/Treatment : [id number] Score <4 Follow-up Not Indicated     Goals Addressed             This Visit's Progress    I want to spend more time with my  youngest daughter               Objective:    Today's Vitals   02/17/24 1314  BP: 111/74  Pulse: 85  Weight: 159 lb 0.6 oz (72.1 kg)  Height: 5' 9 (1.753 m)   Body mass index is 23.49 kg/m.  Hearing/Vision screen Hearing Screening - Comments:: Patient denies any hearing difficulties.   Vision Screening - Comments:: Wears rx glasses - up to date with routine eye exams with  [facility]  Immunizations and Health Maintenance Health  Maintenance  Topic Date Due   HPV VACCINES (1 - 3-dose SCDM series) Never done   DTaP/Tdap/Td (2 - Td or Tdap) 08/02/2023   Influenza Vaccine  09/09/2023   COVID-19 Vaccine (2 - 2025-26 season) 10/10/2023   Medicare Annual Wellness (AWV)  02/16/2025   Hepatitis B Vaccines 19-59 Average Risk  Completed   Hepatitis C Screening  Completed   HIV Screening  Completed   Pneumococcal Vaccine  Aged Out   Meningococcal B Vaccine  Aged Out        Assessment/Plan:  This is a routine wellness examination for Archer.  Patient Care Team: Bevely Doffing, FNP as PCP - General (Family Medicine)  I have personally reviewed and noted the following in the patients chart:   Medical and social history Use of alcohol, tobacco or illicit drugs  Current medications and supplements including opioid prescriptions. Functional ability and status Nutritional status Physical activity Advanced directives List of other physicians Hospitalizations, surgeries, and ER visits in previous 12 months Vitals Screenings to include cognitive, depression, and falls Referrals and appointments  No orders of the defined types were placed in this encounter.  In addition, I have reviewed and discussed with patient certain preventive protocols, quality metrics, and best practice recommendations. A written personalized care plan for preventive services as well as general preventive health recommendations were provided to patient.   Lakita Sahlin, CMA   02/17/2024   Return February 18, 2025 at 3:50 pm, for In office Medicare Well Visit w  Wellness Nurse.  After Visit Summary: (In Person-Printed) AVS printed and given to the patient   "

## 2024-06-04 ENCOUNTER — Encounter

## 2025-02-18 ENCOUNTER — Ambulatory Visit: Payer: Self-pay
